# Patient Record
Sex: Female | Born: 1950 | Race: White | Hispanic: No | Marital: Married | Smoking: Never smoker
Health system: Southern US, Community
[De-identification: ages and names within clinical notes are randomized; demographics above are authoritative.]

## PROBLEM LIST (undated history)

## (undated) DIAGNOSIS — H9190 Unspecified hearing loss, unspecified ear: Secondary | ICD-10-CM

## (undated) DIAGNOSIS — E039 Hypothyroidism, unspecified: Secondary | ICD-10-CM

## (undated) DIAGNOSIS — C801 Malignant (primary) neoplasm, unspecified: Secondary | ICD-10-CM

## (undated) DIAGNOSIS — M1990 Unspecified osteoarthritis, unspecified site: Secondary | ICD-10-CM

## (undated) DIAGNOSIS — I10 Essential (primary) hypertension: Secondary | ICD-10-CM

## (undated) DIAGNOSIS — K219 Gastro-esophageal reflux disease without esophagitis: Secondary | ICD-10-CM

## (undated) DIAGNOSIS — E785 Hyperlipidemia, unspecified: Secondary | ICD-10-CM

## (undated) HISTORY — DX: Hyperlipidemia, unspecified: E78.5

## (undated) HISTORY — PX: TONSILLECTOMY: SUR1361

## (undated) HISTORY — DX: Gastro-esophageal reflux disease without esophagitis: K21.9

## (undated) HISTORY — DX: Unspecified osteoarthritis, unspecified site: M19.90

## (undated) HISTORY — DX: Malignant (primary) neoplasm, unspecified: C80.1

## (undated) HISTORY — DX: Unspecified hearing loss, unspecified ear: H91.90

---

## 1997-04-02 ENCOUNTER — Other Ambulatory Visit: Admission: RE | Admit: 1997-04-02 | Discharge: 1997-04-02 | Payer: Self-pay | Admitting: *Deleted

## 1997-04-09 ENCOUNTER — Other Ambulatory Visit: Admission: RE | Admit: 1997-04-09 | Discharge: 1997-04-09 | Payer: Self-pay | Admitting: *Deleted

## 1997-12-14 ENCOUNTER — Other Ambulatory Visit: Admission: RE | Admit: 1997-12-14 | Discharge: 1997-12-14 | Payer: Self-pay | Admitting: *Deleted

## 1998-12-16 ENCOUNTER — Other Ambulatory Visit: Admission: RE | Admit: 1998-12-16 | Discharge: 1998-12-16 | Payer: Self-pay | Admitting: *Deleted

## 1999-01-03 HISTORY — PX: ABDOMINAL HYSTERECTOMY: SHX81

## 1999-02-16 ENCOUNTER — Encounter (INDEPENDENT_AMBULATORY_CARE_PROVIDER_SITE_OTHER): Payer: Self-pay | Admitting: Specialist

## 1999-02-16 ENCOUNTER — Inpatient Hospital Stay (HOSPITAL_COMMUNITY): Admission: RE | Admit: 1999-02-16 | Discharge: 1999-02-18 | Payer: Self-pay | Admitting: *Deleted

## 1999-12-20 ENCOUNTER — Other Ambulatory Visit: Admission: RE | Admit: 1999-12-20 | Discharge: 1999-12-20 | Payer: Self-pay | Admitting: *Deleted

## 2000-10-22 ENCOUNTER — Encounter: Payer: Self-pay | Admitting: Internal Medicine

## 2000-10-22 ENCOUNTER — Ambulatory Visit (HOSPITAL_COMMUNITY): Admission: RE | Admit: 2000-10-22 | Discharge: 2000-10-22 | Payer: Self-pay | Admitting: Internal Medicine

## 2000-12-27 ENCOUNTER — Other Ambulatory Visit: Admission: RE | Admit: 2000-12-27 | Discharge: 2000-12-27 | Payer: Self-pay | Admitting: *Deleted

## 2001-12-30 ENCOUNTER — Other Ambulatory Visit: Admission: RE | Admit: 2001-12-30 | Discharge: 2001-12-30 | Payer: Self-pay | Admitting: *Deleted

## 2002-05-28 ENCOUNTER — Other Ambulatory Visit: Admission: RE | Admit: 2002-05-28 | Discharge: 2002-05-28 | Payer: Self-pay | Admitting: Dermatology

## 2002-12-22 ENCOUNTER — Other Ambulatory Visit: Admission: RE | Admit: 2002-12-22 | Discharge: 2002-12-22 | Payer: Self-pay | Admitting: *Deleted

## 2003-01-21 ENCOUNTER — Ambulatory Visit (HOSPITAL_COMMUNITY): Admission: RE | Admit: 2003-01-21 | Discharge: 2003-01-21 | Payer: Self-pay | Admitting: Internal Medicine

## 2003-01-21 HISTORY — PX: COLONOSCOPY: SHX174

## 2003-01-21 HISTORY — PX: ESOPHAGOGASTRODUODENOSCOPY: SHX1529

## 2003-01-23 ENCOUNTER — Ambulatory Visit (HOSPITAL_COMMUNITY): Admission: RE | Admit: 2003-01-23 | Discharge: 2003-01-23 | Payer: Self-pay | Admitting: Internal Medicine

## 2003-01-27 ENCOUNTER — Ambulatory Visit (HOSPITAL_COMMUNITY): Admission: RE | Admit: 2003-01-27 | Discharge: 2003-01-27 | Payer: Self-pay | Admitting: Internal Medicine

## 2003-12-23 ENCOUNTER — Other Ambulatory Visit: Admission: RE | Admit: 2003-12-23 | Discharge: 2003-12-23 | Payer: Self-pay | Admitting: *Deleted

## 2004-02-10 ENCOUNTER — Ambulatory Visit: Payer: Self-pay | Admitting: Internal Medicine

## 2004-03-29 ENCOUNTER — Ambulatory Visit (HOSPITAL_COMMUNITY): Admission: RE | Admit: 2004-03-29 | Discharge: 2004-03-29 | Payer: Self-pay | Admitting: Neurology

## 2004-06-02 ENCOUNTER — Ambulatory Visit: Payer: Self-pay | Admitting: Internal Medicine

## 2004-08-26 ENCOUNTER — Ambulatory Visit: Payer: Self-pay | Admitting: *Deleted

## 2004-09-09 ENCOUNTER — Ambulatory Visit: Payer: Self-pay | Admitting: *Deleted

## 2004-09-09 ENCOUNTER — Ambulatory Visit (HOSPITAL_COMMUNITY): Admission: RE | Admit: 2004-09-09 | Discharge: 2004-09-09 | Payer: Self-pay | Admitting: *Deleted

## 2004-09-13 ENCOUNTER — Ambulatory Visit: Payer: Self-pay | Admitting: *Deleted

## 2004-09-20 ENCOUNTER — Ambulatory Visit (HOSPITAL_COMMUNITY): Admission: RE | Admit: 2004-09-20 | Discharge: 2004-09-20 | Payer: Self-pay | Admitting: *Deleted

## 2004-09-20 ENCOUNTER — Ambulatory Visit: Payer: Self-pay | Admitting: *Deleted

## 2004-09-27 ENCOUNTER — Ambulatory Visit: Payer: Self-pay | Admitting: *Deleted

## 2005-01-16 ENCOUNTER — Ambulatory Visit: Payer: Self-pay | Admitting: Internal Medicine

## 2005-01-26 ENCOUNTER — Other Ambulatory Visit: Admission: RE | Admit: 2005-01-26 | Discharge: 2005-01-26 | Payer: Self-pay | Admitting: *Deleted

## 2006-01-09 ENCOUNTER — Ambulatory Visit: Payer: Self-pay | Admitting: Internal Medicine

## 2006-03-26 ENCOUNTER — Other Ambulatory Visit: Admission: RE | Admit: 2006-03-26 | Discharge: 2006-03-26 | Payer: Self-pay | Admitting: *Deleted

## 2006-07-08 ENCOUNTER — Emergency Department (HOSPITAL_COMMUNITY): Admission: EM | Admit: 2006-07-08 | Discharge: 2006-07-08 | Payer: Self-pay | Admitting: Emergency Medicine

## 2006-09-07 ENCOUNTER — Emergency Department (HOSPITAL_COMMUNITY): Admission: EM | Admit: 2006-09-07 | Discharge: 2006-09-07 | Payer: Self-pay | Admitting: Emergency Medicine

## 2006-11-01 ENCOUNTER — Ambulatory Visit: Payer: Self-pay | Admitting: Internal Medicine

## 2007-03-27 ENCOUNTER — Other Ambulatory Visit: Admission: RE | Admit: 2007-03-27 | Discharge: 2007-03-27 | Payer: Self-pay | Admitting: *Deleted

## 2007-10-02 ENCOUNTER — Ambulatory Visit: Payer: Self-pay | Admitting: Internal Medicine

## 2008-01-20 ENCOUNTER — Ambulatory Visit (HOSPITAL_COMMUNITY): Admission: RE | Admit: 2008-01-20 | Discharge: 2008-01-20 | Payer: Self-pay | Admitting: Internal Medicine

## 2008-04-21 ENCOUNTER — Encounter: Payer: Self-pay | Admitting: Internal Medicine

## 2008-04-30 ENCOUNTER — Encounter: Payer: Self-pay | Admitting: Gastroenterology

## 2008-06-04 ENCOUNTER — Telehealth: Payer: Self-pay | Admitting: Gastroenterology

## 2008-06-09 ENCOUNTER — Telehealth (INDEPENDENT_AMBULATORY_CARE_PROVIDER_SITE_OTHER): Payer: Self-pay

## 2008-06-18 DIAGNOSIS — K219 Gastro-esophageal reflux disease without esophagitis: Secondary | ICD-10-CM | POA: Insufficient documentation

## 2008-06-18 DIAGNOSIS — R079 Chest pain, unspecified: Secondary | ICD-10-CM | POA: Insufficient documentation

## 2008-06-19 ENCOUNTER — Ambulatory Visit: Payer: Self-pay | Admitting: Cardiology

## 2008-06-23 ENCOUNTER — Telehealth (INDEPENDENT_AMBULATORY_CARE_PROVIDER_SITE_OTHER): Payer: Self-pay

## 2008-07-07 ENCOUNTER — Telehealth (INDEPENDENT_AMBULATORY_CARE_PROVIDER_SITE_OTHER): Payer: Self-pay

## 2008-08-19 ENCOUNTER — Encounter: Payer: Self-pay | Admitting: Gastroenterology

## 2008-11-04 DIAGNOSIS — K5900 Constipation, unspecified: Secondary | ICD-10-CM | POA: Insufficient documentation

## 2008-11-05 ENCOUNTER — Ambulatory Visit: Payer: Self-pay | Admitting: Internal Medicine

## 2008-11-05 DIAGNOSIS — E039 Hypothyroidism, unspecified: Secondary | ICD-10-CM | POA: Insufficient documentation

## 2008-11-09 ENCOUNTER — Ambulatory Visit (HOSPITAL_COMMUNITY): Admission: RE | Admit: 2008-11-09 | Discharge: 2008-11-09 | Payer: Self-pay | Admitting: Surgery

## 2008-12-21 ENCOUNTER — Telehealth (INDEPENDENT_AMBULATORY_CARE_PROVIDER_SITE_OTHER): Payer: Self-pay

## 2009-04-12 ENCOUNTER — Telehealth (INDEPENDENT_AMBULATORY_CARE_PROVIDER_SITE_OTHER): Payer: Self-pay

## 2009-04-15 ENCOUNTER — Encounter: Payer: Self-pay | Admitting: Internal Medicine

## 2009-05-18 ENCOUNTER — Ambulatory Visit (HOSPITAL_COMMUNITY): Admission: RE | Admit: 2009-05-18 | Discharge: 2009-05-18 | Payer: Self-pay | Admitting: Internal Medicine

## 2009-05-18 ENCOUNTER — Ambulatory Visit: Payer: Self-pay | Admitting: Internal Medicine

## 2009-05-18 HISTORY — PX: COLONOSCOPY: SHX174

## 2009-06-03 ENCOUNTER — Encounter: Payer: Self-pay | Admitting: Gastroenterology

## 2009-08-13 ENCOUNTER — Ambulatory Visit (HOSPITAL_COMMUNITY): Admission: RE | Admit: 2009-08-13 | Discharge: 2009-08-13 | Payer: Self-pay | Admitting: Surgery

## 2009-09-21 ENCOUNTER — Telehealth (INDEPENDENT_AMBULATORY_CARE_PROVIDER_SITE_OTHER): Payer: Self-pay

## 2009-10-14 ENCOUNTER — Telehealth (INDEPENDENT_AMBULATORY_CARE_PROVIDER_SITE_OTHER): Payer: Self-pay

## 2010-01-21 ENCOUNTER — Emergency Department (HOSPITAL_COMMUNITY)
Admission: EM | Admit: 2010-01-21 | Discharge: 2010-01-21 | Payer: Self-pay | Source: Home / Self Care | Admitting: Emergency Medicine

## 2010-02-01 NOTE — Progress Notes (Signed)
Summary: PA for lansoprazole  Phone Note Call from Patient Call back at Work Phone [phone]   Caller: Patient Summary of Call: pt called- needs PA for lansoprazole. Insurance phone number is [phone]. Called company- got approval for 1 year starting today. called pt- LMOM with this information.  Initial call taken by: Hendricks Limes LPN,  October 14, 2009 9:29 AM

## 2010-02-01 NOTE — Progress Notes (Signed)
Summary: refills  Phone Note Call from Patient Call back at Work Phone [phone]   Caller: Patient Summary of Call: pt called- she is almost out of lansoprazole. pt wants to know if she needs an ov or if we can send refill to [facility] for mail order. please advise Initial call taken by: Hendricks Limes LPN,  September 21, 2009 12:12 PM     Appended Document: refills    Prescriptions: LANSOPRAZOLE 30 MG CPDR (LANSOPRAZOLE) one by mouth 30 mins before breakfast daily  #90 x 3   Entered and Authorized by:   Gerrit Halls NP   Signed by:   Gerrit Halls NP on 09/22/2009   Method used:   Faxed to ...       [facility] (mail-order)             , [place]         Ph: [phone]       Fax: [phone]   RxID:   [id number]

## 2010-02-01 NOTE — Progress Notes (Signed)
  Phone Note Call from Patient Call back at Home Phone [phone]   Caller: Patient Summary of Call: pt called- she went to pcp and had complete physical done. she stated they checked her stool for blood and it came back faintly positive. she also stated her abd was mildly swollen and painful in the am. wants to know what she needs to do. Initial call taken by: Hendricks Limes LPN,  April 12, 2009 12:06 PM     Appended Document:  if last tcs was 05 and we have documentation of hemocult positive status - she will need a tcs  Appended Document:  pt aware, Doris to get pt scheduled

## 2010-02-01 NOTE — Medication Information (Signed)
Summary: rx refill fax form  rx refill fax form   Imported By: Hendricks Limes LPN 54/09/8117 14:78:29  _____________________________________________________________________  External Attachment:    Type:   Image     Comment:   External Document

## 2010-02-01 NOTE — Letter (Signed)
Summary: Internal Other Domingo Dimes  Internal Other Domingo Dimes   Imported By: Cloria Spring LPN 93/23/5573 22:02:54  _____________________________________________________________________  External Attachment:    Type:   Image     Comment:   External Document

## 2010-02-01 NOTE — Progress Notes (Signed)
Summary: lansoprazole refill  Phone Note Call from Patient Call back at Home Phone [phone] Call back at Work Phone [phone] Call back at [phone]   Caller: Patient Summary of Call: pt called- needs refill on lansoprazole written and faxed to [facility]. Initial call taken by: Hendricks Limes LPN,  April 12, 2009 10:52 AM     Appended Document: lansoprazole refill LSL gave 3 RFs in 12/2008 w/ #90 day supply.  Please let pt know.  Appended Document: lansoprazole refill spoke with Durwin Nora at [facility]. he stated pt did have refills left. Called pt- LMOM with that information.

## 2010-05-17 NOTE — Assessment & Plan Note (Signed)
[facility]                       [place] CARDIOLOGY OFFICE NOTE   NAME:BUTLERSuzetta, Timko                        MRN:          [id number]  DATE:06/19/2008                            DOB:          [birth year]    Eileen Green is a Cardiology consultation.   I was asked by Dr. Carylon Perches to reevaluate Eileen Green with chest pain.   The Sunday before Memorial Day, she had a severe episode of chest pain  in the middle of the night.  It was described as a burning and a  pressure in the center of her chest.  It did not radiate.  She has some  nausea with it.  She is finally able to burp enough to relieve it.   She has a history of acid reflux and has been on chronic Aciphex.  She  called Dr. Jena Gauss, who placed her on Dexilant 60 mg a day.  She started  this about 4 or 5 days ago.   Remarkably, the day before the night of the severe episode of  discomfort, she had eaten out all day including [employer] and also  [employer].  She had ribs for dinner that night.   She denies any hematemesis or hemoptysis or melena.  She has had no  change in her bowel habits.   She has been seen in our office twice, once in 2001 and once in 2006 for  chest discomfort.  She had a negative stress test when she was last here  specifically a negative stress echo with an ejection fraction of 60% by  Dr. Dionicio Stall.   She is followed by Dr. Ouida Sills, who placed her on simvastatin about a year  ago.  She does not take aspirin.   Her past medical history, she does not smoke.  She eats poorly and  admits to it.  She has fought her weight for sometime.  She does not  exercise.   Her current meds are:  1. Centrum once a day.  2. Fibertab 2 a day.  3. Dexilant 60 mg a day.  4. Synthroid 88 mcg a day.  5. Glucosamine 150 mg a day.  6. Vitamin D.  7. Calcium 1 a day.  8. Simvastatin 20 mg nightly.   She had a hysterectomy in 2001.  She has also had a oophorectomy in the  past.   She has no known drug allergies.   SOCIAL HISTORY:  She does not smoke or drink.  She is married.  She  lives in [place].  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   Her family history is remarkable for mother having coronary bypass  grafting.  She died at late age 24 after she stopped taking all of her  meds.  Her name is Eileen Green.   Her review of systems, she wears contacts.  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  Her teeth are in good shape.  She has had some lower extremity edema  particularly after being at [facility] for a week.   Rest of review of systems are  negative.   PHYSICAL EXAMINATION:  GENERAL:  Very pleasant lady in no acute  distress.  VITAL SIGNS:  Her blood pressure is 138/90, pulse is 95 and regular.  I  rechecked it after she had sat for a while and it was 65 and regular.  Her weight is 187.  HEENT:  Normal.  NECK:  Supple.  Carotid upstrokes were equal bilaterally without bruits.  There is no JVD.  Thyroid is not enlarged.  Trachea is midline.  LUNGS:  Clear to auscultation and percussion.  HEART:  Regular rate and rhythm.  PMI is nondisplaced.  There is no  murmur, rub, or gallop.  ABDOMEN:  Soft, good bowel sounds.  No midline bruit.  No hepatomegaly.  There is really no epigastric tenderness.  EXTREMITIES:  No cyanosis,  clubbing, but she does have trace edema.  Pulses are intact.  No obvious  varicosities.  No sign of DVT.  NEURO:  Intact.  SKIN:  Unremarkable.   Her electrocardiogram is completely normal.   ASSESSMENT AND PLAN:  1. Gastroesophageal reflux.  2. Noncardiac chest pain.  3. Hyperlipidemia on simvastatin.  4. Obesity.  5. Sedentary lifestyle.   RECOMMENDATIONS:  1. Continue with antireflux precautions and the new medication.  2. If she has any symptoms consistent with angina which I described      her at length today, she will let us know.  3. Once she is over her esophagitis and gastroesophageal reflux, she      wonders  she should take an aspirin each day.  I told her 33 of a      coated aspirin was reasonable.  4. Walk 3 hours a week.  5. Weight reduction.  6. Followup with Dr. Ouida Sills.     Thomas C. Daleen Squibb, MD, [facility]  Electronically Signed    TCW/MedQ  DD: 06/19/2008  DT: 06/20/2008  Job #: [id number]   cc:   Kingsley Callander. Ouida Sills, MD

## 2010-05-17 NOTE — Assessment & Plan Note (Signed)
NAMECHERYLENE, Green                  CHART#:  [id number]   DATE:  11/01/2006                       DOB:  March 24, 1950   Eileen Green is doing well from a GI standpoint.  Aciphex 20 mg daily is  squelching her reflux symptoms.  If she misses a day or two, she has  recurrent symptoms.  No odynophagia.  No dysphagia.  EGD and colonoscopy  back in 2005 were pretty much negative.  She had a long tortuous colon.  She has been taking Citrucel once to twice daily, and MiraLax at bedtime  to combat constipation.  The big challenge is her memory to take the  MiraLax in the evening.  She may get it 3 to 4 days, and she realizes  she has not had a bowel movement, and has not taken any MiraLax.  She  gets back on MiraLax, and then eventually over the next couple of days  has a bowel movement.  Has not had any rectal bleeding or other  significant illnesses.  She did have some gastroenteritis symptoms for  which she saw Dr. Ouida Sills back on July 3rd.  Those were self-limiting, and  no problems since that time.   CURRENT MEDICATIONS:  See updated list.   ALLERGIES:  NO KNOWN DRUG ALLERGIES.   EXAMINATION:  She looks well.  Weight 175, which is down 9 pounds.  Height is 5 feet 6 inches.  Temperature 98.  BP 122/86.  Pulse 69.  SKIN:  Warm and dry.  CHEST:  Lungs clear to auscultation.  HEART:  Regular rate and rhythm without murmur, gallop, or rub.  ABDOMEN:  Flat.  Positive bowel sounds.  Soft and nontender.  No  appreciable mass or organomegaly.   ASSESSMENT:  Gastroesophageal reflux disease symptoms are well  controlled on Aciphex.  She is to continue that regimen and watch her  diet.  One year prescription refill given.  Constipation predominant  irritable bowel syndrome.  I have asked her to continue taking her  Citrucel once daily, and she might actually try taking her MiraLax in  the morning rather than at bedtime to facilitate compliance.  She might  ought to plan to take MiraLax as a scheduled  agent every other day.  I  would like her to go no more than 3 days without a bowel movement  (ideally  2).  She is to call if she has any persisting problems.  Otherwise, I  plan to see this nice lady back at 1 year.  She will not be due another  screening colonoscopy until 2015.       Jonathon Bellows, M.D.  Electronically Signed     RMR/MEDQ  D:  11/01/2006  T:  11/01/2006  Job:  [id number]   cc:   Kingsley Callander. Ouida Sills, MD

## 2010-05-17 NOTE — Assessment & Plan Note (Signed)
NAMEKYMORAH, KORF                  CHART#:  [id number]   DATE:  10/02/2007                       DOB:  05/25/1950   Follow up gastroesophageal reflux disease and constipation.   The patient takes Aciphex 20 mg orally daily with excellent control of  her reflux symptoms.  She does normally takes MiraLax on a regular basis  and may go 3-4 days without taking it and then has difficulties with  constipation.  This has been recurrent thing.  She has not had any  rectal bleeding.  She had essentially negative colonoscopy in 2005.  No  intercurrent illnesses since last seen here on November 01, 2006.   CURRENT MEDICATIONS:  See updated list.   ALLERGIES:  No known drug allergies.   PHYSICAL EXAMINATION:  VITAL SIGNS:  Weight 187, up 12 pounds; height 5  feet and 6 inches; temperature 98, BP 120/80, and pulse 84.  SKIN:  Warm and dry.  CHEST:  Lungs are clear to auscultation.  CARDIAC:  Regular rate and rhythm without murmur, gallop, or rub.  ABDOMEN:  Nondistended, positive bowel sounds, soft, and nontender  without appreciable mass or organomegaly.   ASSESSMENT:  1. Chronic constipation/obstipation responsive to MiraLax, which she      takes only sporadically.  2. Gastroesophageal reflux disease, symptoms well controlled on      Aciphex 20 mg orally daily.   RECOMMENDATIONS:  1. Continue Aciphex 20 mg orally daily.  2. We will get her on more regular schedule with MiraLax, use 17 g      orally at bedtime, to be taking nightly and see how she does with      back off only if she has difficulties with diarrhea.  I believe      that she is going to need MiraLax daily to keep her colon from      backing up unless she has any problem with this regimen.  I will      plan to see her back in 1 year.      Jonathon Bellows, M.D.  Electronically Signed    RMR/MEDQ  D:  10/02/2007  T:  10/03/2007  Job:  [id number]

## 2010-05-20 NOTE — Op Note (Signed)
NAME:  Eileen Green, Eileen Green                           ACCOUNT NO.:  [id number]   MEDICAL RECORD NO.:  [id number]                   PATIENT TYPE:  AMB   LOCATION:  DAY                                  FACILITY:  [facility]   PHYSICIAN:  R. Roetta Sessions, M.D.              DATE OF BIRTH:  22-Jan-1950   DATE OF PROCEDURE:  01/21/2003  DATE OF DISCHARGE:                                 OPERATIVE REPORT   PROCEDURE:  Diagnostic esophagogastroduodenoscopy followed by screening  colonoscopy.   ENDOSCOPIST:  Gerrit Friends. Rourk, M.D.   INDICATIONS FOR PROCEDURE:  The patient is a 60 year old lady who is  describing break through reflux symptoms, taking Aciphex daily.  I saw her  in the office several days ago and asked her to start taking Aciphex b.i.d..  She has not done so. She has a component of postprandial retroxiphoid pain  as well, sometimes lasting days. She has chronic constipation. She has never  had her colon imaged.  EGD and colonoscopy are now being done. This approach  has been discussed with the patient at length. The potential risks,  benefits, and alternatives have been reviewed; questions answered.  She is  agreeable.  Please see my dictated H&P from January 13, 2003.   PROCEDURE NOTE:  O2 saturation, blood pressure, pulse and respirations were  monitored throughout the entirety of the procedures.  Conscious sedation:  Versed 4 mg IV, Demerol 100 mg IV in divided doses for both procedures.   INSTRUMENT:  Olympus video chip adult gastroscope and colonoscope.   EGD FINDINGS:  Examination of the tubular esophagus revealed no mucosal  abnormalities.  The EG junction was easily traversed.   STOMACH:  The gastric cavity was empty.  It insufflated well with air.  A  thorough examination of the gastric mucosa including a retroflex view of the  proximal stomach and esophagogastric junction demonstrated no abnormalities.  The pylorus was patent and easily traversed.   DUODENUM:  The bulb and  the second portion appeared normal.   THERAPEUTIC/DIAGNOSTIC MANEUVERS:  None.   The patient tolerated the procedure well and was prepared for colonoscopy.  A digital rectal exam revealed no abnormalities.   ENDOSCOPIC FINDINGS:  The prep was unfortunately marginal particularly in  the left colon (the patient said that she had solid food yesterday including  a [place] sandwich against our recommendations).   RECTUM:  Examination of the rectal mucosa including the retroflex view of  the anal verge revealed no abnormalities.   COLON:  The colonic mucosa was surveyed from the rectosigmoid junction  through the left transverse and right colon to the area of the appendiceal  orifice, ileocecal valve, and cecum. These structures were well seen and  photographed for the record.   From this level the scope was slowly and cautiously withdrawn.  All  previously mentioned mucosal surfaces were again seen.  The patient had a  long tortuous colon.  The colonic mucosa otherwise appeared normal.  There  was formed stool elements in a segment of the sigmoid and descending colon  which made the exam more difficulty, but with washing and changing the  patient's position, I feel that the mucosa was fairly well seen.  The  patient tolerated both procedures well and was reacted in endoscopy.   EGD IMPRESSION:  Normal esophagus, stomach, D1 and D2.   COLONOSCOPY FINDINGS:  1. Normal rectum.  2. Long capacious colon, moderate prep made the exam more challenging.   RECOMMENDATIONS:  1. Constipation literature given to [name].  2. Daily fiber supplementation recommended.  3. MiraLax 17 grams orally daily at bedtime p.r.n. constipation.  4. Continue Aciphex 20 mg orally daily.  5. This lady needs to have further evaluation of her postprandial     retroxiphoid pain.  To this end, we will proceed with a gallbladder     ultrasound.  6. Followup appointment with me in 3 weeks.       ___________________________________________                                            Jonathon Bellows, M.D.   RMR/MEDQ  D:  01/21/2003  T:  01/21/2003  Job:  [id number]   cc:   Kingsley Callander. Ouida Sills, M.D.  [address]  [place]  [place] [address]  Fax: [phone]   R. Roetta Sessions, M.D.  [address]  [place]  [place] [address]  Fax: [phone]

## 2010-05-20 NOTE — Procedures (Signed)
NAMEERZA, MOTHERSHEAD NO.:  [id number]   MEDICAL RECORD NO.:  [id number]          PATIENT TYPE:  OUT   LOCATION:  RAD                           FACILITY:  [facility]   PHYSICIAN:  Vida Roller, M.D.   DATE OF BIRTH:  [birth year]   DATE OF PROCEDURE:  09/20/2004  DATE OF DISCHARGE:                                  ECHOCARDIOGRAM   HISTORY OF PRESENT ILLNESS:  Eileen Green is a 60 year old woman with a  history of atypical chest discomfort.  She has no known coronary artery  disease.  She had an exercise profusion study done in our nuclear lab which  unfortunately was nondiagnostic and this an evaluation for ischemia.   DESCRIPTION OF PROCEDURE:  The patient was brought to the echocardiographic  lab where she was placed in the left lateral decubitus position.  Echocardiographic images were obtained in the apical IV, apical II,  parasternal lung, parasternal short axis view.  These views were kept on  tape as well as Cine-loop format.  The patient then exercised under Bruce  protocol and maintained at least 85% of her maximum predicted heart rate for  her age.  She was moved immediately back to the echocardiographic table  where echocardiographic images were obtained in the same four views.  These  were used in comparison with rest images to assess for ischemia.  She was  then recovered appropriate for Bruce protocol.   REST IMAGES:  There is normal LV systolic function with no significant wall  motion abnormalities.  Estimated ejection fraction 60%.   POST STRESS IMAGES:  LV systolic function improves with exercise.  There is  no significant stress related wall motion abnormalities.  The overall cavity  size gets significantly smaller.   ASSESSMENT:  No evidence of coronary ischemia, normal left ventricular  systolic function.   STRESS TEST:  The patient exercised 8 minutes and 59 seconds of her Bruce  protocol obtaining 10 METS of exercise.  Her heart rate increased  from 97  beats per minute to 164 beats per minute which is 98% of her maximum  predicted heart rate for her age.  Her blood pressure from 132/88 to 198/78.  During that time, she had no ischemic ST and T wave changes and there was no  significant arrhythmia.  Her baseline electrocardiogram is normal.   INTERPRETATION:  This is a low-risk scan in a patient with no known coronary  artery disease.  Clinical correlation is advised.      Vida Roller, M.D.  Electronically Signed     JH/MEDQ  D:  09/20/2004  T:  09/21/2004  Job:  [id number]   cc:   Kingsley Callander. Ouida Sills, MD  [address]  [place]  [place] [address]  Fax: [phone]

## 2010-05-20 NOTE — Procedures (Signed)
NAMEKENNIYAH, Eileen Green NO.:  [id number]   MEDICAL RECORD NO.:  [id number]          PATIENT TYPE:  REC   LOCATION:                                FACILITY:  [facility]   PHYSICIAN:  Vida Roller, M.D.   DATE OF BIRTH:  1950/05/12   DATE OF PROCEDURE:  DATE OF DISCHARGE:                                    STRESS TEST   HISTORY:  Ms. Yearsley is a 60 year old female with no known coronary disease  with atypical chest discomfort. Cardiac risk factors include family history.   BASELINE DATA:  Electrocardiogram reveals sinus rhythm at 65 beats per  minute, blood pressure is 148/90.   The patient exercised for a total of 7 minutes 22 seconds, Bruce protocol  stage III, and 10.1 minutes were achieved. Maximal heart rate was 160 beats  per minute which is 95% of predicted maximum. Maximum blood pressure was  182/80 and resolved down to 132/ 78 in recovery.   Electrocardiogram revealed no ischemic changes. She had few PVCs during  exercise. The patient reported some mild shortness of breath at the end of  exercise. No chest discomfort was noted. Exercise was stopped secondary to  fatigue.   Final images and results are pending M.D. review.      Jae Dire, P.A. [facility]      Vida Roller, M.D.  Electronically Signed    AB/MEDQ  D:  09/09/2004  T:  09/09/2004  Job:  [id number]

## 2010-05-20 NOTE — Consult Note (Signed)
NAMEANASTASIJA, Eileen Green                             ACCOUNT NO.:  [id number]   MEDICAL RECORD NO.:  [id number]                  PATIENT TYPE:   LOCATION:                                       FACILITY:   PHYSICIAN:  R. Roetta Sessions, M.D.              DATE OF BIRTH:  April 22, 1950   DATE OF CONSULTATION:  01/13/2003  DATE OF DISCHARGE:                                   CONSULTATION   REASON FOR CONSULTATION:  Gastroesophageal reflux disease.   REFERRING PHYSICIAN:  Dr.  Carylon Perches.   HISTORY OF PRESENT ILLNESS:  Mrs. Trisa Cranor is a pleasant 60 year old lady  sent over at the courtesy of Dr. Carylon Perches to further evaluate a year or so  history of worsening reflux symptoms.  She has symptoms she describes as  heartburn on a regular basis.  She has been on Aciphex more or less since  May 2004.  She has tried to go off and go to OTC Zantac.  However, this has  been associated with recurrent symptoms.  She has not had any odynophagia,  dysphagia, nausea, vomiting, melena, or rectal bleeding.  She has gained  about 20 pounds in the past 18 months.  She tells me that over the past  several weeks her symptoms have notably worsened and she is having an  element of what she describes as retroxyphoid abdominal pain, sometimes  starting within a half an hour after she eats, may last a half an hour or  so, then it subsides.  Her gallbladder is in situ.  She has never had any  gallbladder studies per her report.  She also complains of constipation,  having very hard, large bowel movements, has to take Surfak laxative twice  daily.  There is family history of colorectal problems and she has never has  her lower GI tract imaged.  She does not smoke or use alcohol.   PAST MEDICAL HISTORY:  Significant for no chronic medical illnesses.  No  cardiac disease, diabetes, hypertension.   PAST SURGERY:  Partial hysterectomy, ovarian cyst (laparoscopy x3).   CURRENT MEDICATIONS:  1. Aciphex 20 mg orally  daily.  2. Surfak b.i.d.  3. Vitamin E and B supplements.  4. Magnesium supplement.  5. Multivitamin.  6. Flax seed oil.  7. Aspirin 81 mg daily.   ALLERGIES:  No known drug allergies.   FAMILY HISTORY:  Father's health is fair, is on dialysis.  Mother is in  fairly good health.  No history of chronic GI or liver illness.   SOCIAL HISTORY:  The patient has been married for 25-and-a-half years.  She  has two children in good health.  She is employed at [employer].  No  tobacco, no alcohol.   REVIEW OF SYSTEMS:  As above.  No chest pain, no dyspnea, no fever or  chills.   PHYSICAL EXAMINATION:  GENERAL:  Reveals a pleasant  60 year old lady resting  comfortably.  VITAL SIGNS:  Weight 181.5 pounds, height 5 feet 6 inches.  Temperature  97.6, blood pressure 110/73, pulse 76.  SKIN:  Warm and dry.  There is no jaundice, no cutaneous stigmata of chronic  liver disease.  HEENT:  No scleral icterus, conjunctivae are pink.  JVD is not prominent.  CHEST:  Lungs are clear to auscultation.  CARDIAC:  Regular rate and rhythm without murmur, gallop, rub.  BREAST:  Deferred.  ABDOMEN:  Somewhat obese, positive bowel sounds, soft, nontender, without  appreciable mass or organomegaly.  EXTREMITIES:  No edema.  RECTAL:  Deferred until time of colonoscopy.   IMPRESSION:  Mrs. Posie Lillibridge is a pleasant 59 year old lady with symptoms  consistent with gastroesophageal reflux disease that have worsened over the  patient 9 months in spite of taking acid suppression therapy in the way of  Aciphex on a fairly regular basis.  She also has some retroxyphoid  postprandial abdominal pain which is suspicious for biliary etiology.  Given  the breakthrough nature of her reflux symptoms on acid suppression she  really needs to have a look at her upper GI tract.  She also is chronically  constipated and is at the threshold age for colorectal cancer screening.  To  this end I have offered Mrs. Bianchini  both an EGD and colonoscopy in the near  future.  I discussed this approach with Mrs. Roseboom at length.  Potential  risks, benefits, alternatives have been reviewed, questions answered.  She  is agreeable to proceed in the near future.  In the interim I have asked  Mrs. Zerkle to increase her Aciphex to 20 mg orally before breakfast and  before supper to see how more aggressive acid suppression regimen affects  her symptoms.   I would like to thank Dr. Carylon Perches for allowing me to see this nice lady  today.  Further recommendations to follow.      ___________________________________________                                            Jonathon Bellows, M.D.   RMR/MEDQ  D:  01/13/2003  T:  01/13/2003  Job:  [id number]   cc:   Kingsley Callander. Ouida Sills, M.D.  [address]  [place]  [place] [address]  Fax: [phone]

## 2010-05-20 NOTE — Procedures (Signed)
NAMEBENTLEIGH, WAREN NO.:  [id number]   MEDICAL RECORD NO.:  [id number]          PATIENT TYPE:  OUT   LOCATION:  RAD                           FACILITY:  [facility]   PHYSICIAN:  Vida Roller, M.D.   DATE OF BIRTH:  19-Dec-1950   DATE OF PROCEDURE:  09/20/2004  DATE OF DISCHARGE:                                    STRESS TEST   HISTORY OF PRESENT ILLNESS:  The patient is a 60 year old female with no  previous history of known CAD, but presented with atypical chest pain.  Previous exercise Myoview was done which showed no diagnostic value.   TEST PERFORMED:  Stress echocardiogram by April Humphrey, N.P.   INDICATIONS FOR TEST:  Chest pain.   SYMPTOMS DURING PROCEDURE:  The patient experienced some shortness of breath  with exertional towards the end, but no chest pain.  No arrhythmias noted.  The reason for stopping the test was patient fatigue.  Resting heart rate  was 97, resting blood pressure 132/88 with target heart rate 143.  Maximum  predicted heart rate was 168.  Maximum heart rate achieved was 164.  Maximum  blood pressure was 198/78.  Total exercise time was 8 minutes 57 seconds.  METS achieved was 2.1.  Worse case ST slope was 14.  Worse case ST level was  -2.2.  The patient complained of fatigued as stress echocardiogram was  completed.  Final imaging pending cardiology review.     ______________________________  April Humphrey, NP      Vida Roller, M.D.  Electronically Signed    AH/MEDQ  D:  09/20/2004  T:  09/20/2004  Job:  [id number]

## 2010-05-23 ENCOUNTER — Encounter (INDEPENDENT_AMBULATORY_CARE_PROVIDER_SITE_OTHER): Payer: Self-pay | Admitting: Surgery

## 2010-08-29 ENCOUNTER — Encounter (INDEPENDENT_AMBULATORY_CARE_PROVIDER_SITE_OTHER): Payer: Self-pay | Admitting: Surgery

## 2010-08-31 ENCOUNTER — Other Ambulatory Visit (INDEPENDENT_AMBULATORY_CARE_PROVIDER_SITE_OTHER): Payer: Self-pay

## 2010-08-31 ENCOUNTER — Other Ambulatory Visit (INDEPENDENT_AMBULATORY_CARE_PROVIDER_SITE_OTHER): Payer: Self-pay | Admitting: Surgery

## 2010-08-31 DIAGNOSIS — E041 Nontoxic single thyroid nodule: Secondary | ICD-10-CM

## 2010-08-31 MED ORDER — SYNTHROID 50 MCG PO TABS: 50.0000 ug | ORAL_TABLET | Freq: Every day | ORAL | Status: DC

## 2010-08-31 NOTE — Progress Notes (Signed)
Addended by: Charise Carwin on: 08/31/2010 04:18 PM   Modules accepted: Orders

## 2010-09-27 ENCOUNTER — Ambulatory Visit (INDEPENDENT_AMBULATORY_CARE_PROVIDER_SITE_OTHER): Payer: Self-pay | Admitting: Surgery

## 2010-09-27 ENCOUNTER — Other Ambulatory Visit (INDEPENDENT_AMBULATORY_CARE_PROVIDER_SITE_OTHER): Payer: Self-pay | Admitting: Surgery

## 2010-09-27 DIAGNOSIS — E042 Nontoxic multinodular goiter: Secondary | ICD-10-CM

## 2010-09-28 ENCOUNTER — Other Ambulatory Visit (INDEPENDENT_AMBULATORY_CARE_PROVIDER_SITE_OTHER): Payer: Self-pay | Admitting: Surgery

## 2010-09-28 DIAGNOSIS — E041 Nontoxic single thyroid nodule: Secondary | ICD-10-CM

## 2010-09-29 ENCOUNTER — Ambulatory Visit (HOSPITAL_COMMUNITY)
Admission: RE | Admit: 2010-09-29 | Discharge: 2010-09-29 | Disposition: A | Discharge status: Home / Self Care | Payer: Federal, State, Local not specified - PPO | Source: Ambulatory Visit | Attending: Surgery | Admitting: Surgery

## 2010-09-29 ENCOUNTER — Other Ambulatory Visit: Payer: Self-pay

## 2010-09-29 DIAGNOSIS — E049 Nontoxic goiter, unspecified: Secondary | ICD-10-CM | POA: Insufficient documentation

## 2010-09-29 DIAGNOSIS — E041 Nontoxic single thyroid nodule: Secondary | ICD-10-CM

## 2010-10-05 ENCOUNTER — Encounter (INDEPENDENT_AMBULATORY_CARE_PROVIDER_SITE_OTHER): Payer: Self-pay | Admitting: Surgery

## 2010-10-06 ENCOUNTER — Encounter (INDEPENDENT_AMBULATORY_CARE_PROVIDER_SITE_OTHER): Payer: Self-pay | Admitting: Surgery

## 2010-10-06 ENCOUNTER — Ambulatory Visit (INDEPENDENT_AMBULATORY_CARE_PROVIDER_SITE_OTHER): Payer: Federal, State, Local not specified - PPO | Admitting: Surgery

## 2010-10-06 DIAGNOSIS — E042 Nontoxic multinodular goiter: Secondary | ICD-10-CM

## 2010-10-06 DIAGNOSIS — E039 Hypothyroidism, unspecified: Secondary | ICD-10-CM

## 2010-10-06 NOTE — Progress Notes (Signed)
Visit Diagnoses: 1. HYPOTHYROIDISM   2. Multinodular goiter (nontoxic)     HISTORY: Patient is a 60 year old female followed for bilateral thyroid nodules since 2010. Patient has had a previous fine needle aspiration biopsy which was benign. Patient is currently taking Synthroid 50 mcg daily as a suppressive dose. Her TSH level in July 2012 was normal at 1.34. Patient has experienced a decrease in size of her thyroid nodules well taking her suppressive therapy.  Followup thyroid ultrasound was obtained on September 29, 2010.  This study shows stable bilateral cystic and solid nodules. The largest cyst on the right measures 1.1 cm. The largest nodule on the left measures 1.0 cm. There is no significant change. There are no worrisome findings.   PERTINENT REVIEW OF SYSTEMS: Patient denies new nodules. She denies dysphagia. She denies pain. She denies tremor. She denies palpitations.   EXAM: HEENT: normocephalic; pupils equal and reactive; sclerae clear; dentition good; mucous membranes moist NECK:  Slight nodularity bilaterally without discrete nodule; symmetric on extension; no palpable anterior or posterior cervical lymphadenopathy; no supraclavicular masses; no tenderness CHEST: clear to auscultation bilaterally without rales, rhonchi, or wheezes CARDIAC: regular rate and rhythm without significant murmur; peripheral pulses are full EXT:  non-tender without edema; no deformity NEURO: no gross focal deficits; no sign of tremor   IMPRESSION: Bilateral thyroid nodules and cysts, clinically stable on thyroid hormone suppression   PLAN: The patient and I reviewed the above information. There is no evidence of malignancy. There is no indication for surgical intervention at this point in time.  Patient should continue on Synthroid 50 mcg daily. I have renewed her prescription through August of 2013 and  Patient should have a followup thyroid ultrasound in 2 years. I will see her back for  her physical examination at that time.   Velora Heckler, MD, FACS General & Endocrine Surgery [facility]

## 2011-09-01 ENCOUNTER — Telehealth (INDEPENDENT_AMBULATORY_CARE_PROVIDER_SITE_OTHER): Payer: Self-pay

## 2011-09-01 NOTE — Telephone Encounter (Signed)
Pt advised will have Dr Gerrit Friends review rx request for synthroid when he is in office 9-3. Pt states she has enough med thru next week.

## 2011-09-06 ENCOUNTER — Telehealth (INDEPENDENT_AMBULATORY_CARE_PROVIDER_SITE_OTHER): Payer: Self-pay

## 2011-09-06 NOTE — Telephone Encounter (Signed)
Refill synthroid #30 x 6 refills faxed to [facility] [phone] per Dr Gerrit Friends.

## 2012-04-03 ENCOUNTER — Telehealth (INDEPENDENT_AMBULATORY_CARE_PROVIDER_SITE_OTHER): Payer: Self-pay

## 2012-04-03 NOTE — Telephone Encounter (Signed)
Received request for refill synthroid. Sent to Dr Gerrit Friends for review.

## 2012-04-04 ENCOUNTER — Telehealth (INDEPENDENT_AMBULATORY_CARE_PROVIDER_SITE_OTHER): Payer: Self-pay

## 2012-04-04 NOTE — Telephone Encounter (Signed)
Refill synthroid with 6 add refills faxed to [facility] [phone] and confirmation received.

## 2012-10-01 ENCOUNTER — Ambulatory Visit (INDEPENDENT_AMBULATORY_CARE_PROVIDER_SITE_OTHER): Payer: Federal, State, Local not specified - PPO | Admitting: Surgery

## 2012-10-01 ENCOUNTER — Encounter (INDEPENDENT_AMBULATORY_CARE_PROVIDER_SITE_OTHER): Payer: Self-pay | Admitting: Surgery

## 2012-10-01 VITALS — BP 106/72 | HR 84 | Temp 97.8°F | Ht 65.0 in | Wt 164.0 lb

## 2012-10-01 DIAGNOSIS — E042 Nontoxic multinodular goiter: Secondary | ICD-10-CM

## 2012-10-01 DIAGNOSIS — E039 Hypothyroidism, unspecified: Secondary | ICD-10-CM

## 2012-10-01 NOTE — Progress Notes (Signed)
General Surgery [facility]  Chief Complaint  Patient presents with  . Follow-up    bilateral thyroid nodules    HISTORY: Patient is a 62 year old female followed for bilateral thyroid nodules. She was last evaluated 2 years ago. Ultrasound at that time showed small bilateral thyroid nodules which had actually decreased in size on thyroid hormone suppression. Patient is currently taking Synthroid 50 mcg daily. TSH level in August 2014 is normal at 0.992. Patient has had not had any further diagnostic studies.  PERTINENT REVIEW OF SYSTEMS: Denies tremor. Denies palpitation. Denies compressive symptoms. Denies any new masses.  EXAM: HEENT: normocephalic; pupils equal and reactive; sclerae clear; dentition good; mucous membranes moist NECK:  Slight nodularity left thyroid lobe without discrete or dominant mass; right thyroid lobe normal to palpation; symmetric on extension; no palpable anterior or posterior cervical lymphadenopathy; no supraclavicular masses; no tenderness CHEST: clear to auscultation bilaterally without rales, rhonchi, or wheezes CARDIAC: regular rate and rhythm without significant murmur; peripheral pulses are full EXT:  non-tender without edema; no deformity NEURO: no gross focal deficits; no sign of tremor   IMPRESSION: Bilateral thyroid nodules, clinically stable Hypothyroidism  PLAN: The patient and I discussed the above findings. We reviewed her laboratory studies. At this point there is nothing on her physical exam to warrant thyroid ultrasound or further diagnostic studies. Her TSH level is perfect at 0.992 on her current dose of Synthroid 50 mcg daily. I have recommended continuing this dosage indefinitely. She should have a TSH level checked once a year.  Patient will return for physical examination in 2 years.  Velora Heckler, MD, [facility] Office: [phone]  Visit Diagnoses: 1. Multinodular goiter  (nontoxic)   2. HYPOTHYROIDISM

## 2012-10-01 NOTE — Patient Instructions (Signed)

## 2012-10-28 ENCOUNTER — Other Ambulatory Visit (INDEPENDENT_AMBULATORY_CARE_PROVIDER_SITE_OTHER): Payer: Self-pay

## 2012-10-28 ENCOUNTER — Telehealth (INDEPENDENT_AMBULATORY_CARE_PROVIDER_SITE_OTHER): Payer: Self-pay

## 2012-10-28 DIAGNOSIS — E039 Hypothyroidism, unspecified: Secondary | ICD-10-CM

## 2012-10-28 MED ORDER — SYNTHROID 50 MCG PO TABS: 50.0000 ug | ORAL_TABLET | Freq: Every day | ORAL | Status: DC

## 2012-10-28 NOTE — Telephone Encounter (Signed)
Per Dr Ardine Eng request refill of synthroid 50 mcg #30 w/6 refills escribed to [facility] per fax request received.

## 2013-01-07 ENCOUNTER — Ambulatory Visit (INDEPENDENT_AMBULATORY_CARE_PROVIDER_SITE_OTHER): Payer: Federal, State, Local not specified - PPO | Admitting: Urology

## 2013-01-07 DIAGNOSIS — N8111 Cystocele, midline: Secondary | ICD-10-CM

## 2013-01-07 DIAGNOSIS — N816 Rectocele: Secondary | ICD-10-CM

## 2013-01-07 DIAGNOSIS — M25559 Pain in unspecified hip: Secondary | ICD-10-CM

## 2013-03-27 ENCOUNTER — Ambulatory Visit: Payer: Federal, State, Local not specified - PPO | Admitting: Gastroenterology

## 2013-04-17 ENCOUNTER — Ambulatory Visit (INDEPENDENT_AMBULATORY_CARE_PROVIDER_SITE_OTHER): Payer: Federal, State, Local not specified - PPO | Admitting: Gastroenterology

## 2013-04-17 ENCOUNTER — Encounter: Payer: Self-pay | Admitting: Gastroenterology

## 2013-04-17 VITALS — BP 122/78 | HR 64 | Temp 97.6°F | Ht 65.0 in | Wt 165.4 lb

## 2013-04-17 DIAGNOSIS — K5909 Other constipation: Secondary | ICD-10-CM

## 2013-04-17 MED ORDER — LINACLOTIDE 145 MCG PO CAPS: 145.0000 ug | ORAL_CAPSULE | Freq: Every day | ORAL | Status: DC

## 2013-04-17 NOTE — Patient Instructions (Signed)
Please complete the stool sample and return to our office.   I have provided a voucher for you to get a month supply of Linzess free. Take this once each morning on an empty stomach, at least 30 minutes before breakfast. Refills are at the pharmacy as well. This is in place of Miralax.  We will decide about a colonoscopy after the stool test is completed.

## 2013-04-17 NOTE — Progress Notes (Signed)
Referring Provider: Asencion Noble, MD Primary Care Physician:  Asencion Noble, MD Primary GI: Dr. Gala Romney  Chief Complaint  Patient presents with  . Constipation    HPI:   Eileen Green presents today at the request of Dr. Willey Blade secondary to change in bowel habits. Stool size of finger. Doesn't know if this was present in 2011. Stool caliber changed. Always gassy, airy, noisy. Thinks may be related to stress. Would wake up sometimes and be in pain. Hasn't happened in about 4-6 weeks. Feels like rectum is vibrating intermittently. Every now and then just a small twinge in RLQ, like a 0.5 on a scale of 1-10. Sometimes feels like bladder is hanging out vagina. Saw urologist and told had cystocele and rectocele. Miralax daily. Last colonoscopy in 2011 by Dr. Gala Romney with normal rectum, narrow-mouth left-sided diverticula. No rectal bleeding.    Past Medical History  Diagnosis Date  . GERD (gastroesophageal reflux disease)   . Hyperlipidemia   . Deaf     LEFT EAR  . Arthritis   . Cancer     skin melanoma under left eye    Past Surgical History  Procedure Laterality Date  . Tonsillectomy    . Abdominal hysterectomy  2001    Partial  . Esophagogastroduodenoscopy  01/21/2003    RMR:  Normal esophagus, stomach, D1 and D2  . Colonoscopy  01/21/2003    MWU:XLKGMW rectum/ Long capacious colon, moderate prep made the exam more challenging.  . Colonoscopy  05/18/2009    Dr. Gala Romney: Normal rectum/narrow mouth left sided diverticula     Current Outpatient Prescriptions  Medication Sig Dispense Refill  . Esomeprazole Magnesium (NEXIUM PO) Take 40 mg by mouth daily.       . hydrochlorothiazide (MICROZIDE) 12.5 MG capsule Take 12.5 mg by mouth daily.       . Lactobacillus (DIGESTIVE HEALTH PROBIOTIC PO) Take by mouth daily.        Marland Kitchen LISINOPRIL PO Take 10 mg by mouth at bedtime.       . Methylcellulose, Laxative, 500 MG TABS Take by mouth daily.        . Misc Natural Products (OSTEO BI-FLEX TRIPLE  STRENGTH PO) Take by mouth daily.        . Multiple Vitamin (MULTIVITAMIN) capsule Take 1 capsule by mouth at bedtime.       . polyethylene glycol (MIRALAX / GLYCOLAX) packet Take 17 g by mouth daily.      Marland Kitchen PRAVASTATIN SODIUM PO Take 10 mg by mouth daily.       Marland Kitchen SYNTHROID 50 MCG tablet Take 1 tablet (50 mcg total) by mouth daily.  30 tablet  6  . FLUZONE QUADRIVALENT 0.5 ML injection       . ZOSTAVAX 10272 UNT/0.65ML injection        No current facility-administered medications for this visit.    Allergies as of 04/17/2013  . (No Known Allergies)    Family History  Problem Relation Age of Onset  . Heart disease Mother   . Heart disease Father   . Kidney disease Father   . Thyroid nodules Father   . Dementia Mother   . Colon cancer Neg Hx     History   Social History  . Marital Status: Married    Spouse Name: N/A    Number of Children: N/A  . Years of Education: N/A   Occupational History  . retired     still teaches at [employer], Brewing technologist in  math   Social History Main Topics  . Smoking status: Never Smoker   . Smokeless tobacco: Never Used  . Alcohol Use: No  . Drug Use: No  . Sexual Activity: None   Other Topics Concern  . None   Social History Narrative  . None    Review of Systems: As mentioned in HPI.   Physical Exam: BP 122/78  Pulse 64  Temp(Src) 97.6 F (36.4 C) (Oral)  Ht 5\' 5"  (1.651 m)  Wt 165 lb 6.4 oz (75.025 kg)  BMI 27.52 kg/m2 General:   Alert and oriented. No distress noted. Pleasant and cooperative.  Head:  Normocephalic and atraumatic. Eyes:  Conjuctiva clear without scleral icterus. Mouth:  Oral mucosa pink and moist. Good dentition. No lesions. Neck:  Supple, without mass or thyromegaly. Heart:  S1, S2 present without murmurs, rubs, or gallops. Regular rate and rhythm. Abdomen:  +BS, soft, non-tender and non-distended. No rebound or guarding. No HSM or masses noted. Rectal: without mass on internal exam Msk:   Symmetrical without gross deformities. Normal posture. Extremities:  Without edema. Neurologic:  Alert and  oriented x4;  grossly normal neurologically. Skin:  Intact without significant lesions or rashes. Cervical Nodes:  No significant cervical adenopathy. Psych:  Alert and cooperative. Normal mood and affect.

## 2013-04-23 ENCOUNTER — Ambulatory Visit (INDEPENDENT_AMBULATORY_CARE_PROVIDER_SITE_OTHER): Payer: Federal, State, Local not specified - PPO | Admitting: Gastroenterology

## 2013-04-23 DIAGNOSIS — K5900 Constipation, unspecified: Secondary | ICD-10-CM

## 2013-04-23 LAB — IFOBT (OCCULT BLOOD): IMMUNOLOGICAL FECAL OCCULT BLOOD TEST: POSITIVE

## 2013-04-23 NOTE — Progress Notes (Signed)
Pt return IFOBT test and it was POSTIVE

## 2013-04-23 NOTE — Assessment & Plan Note (Signed)
63 year old female with chronic constipation, now with change in caliber of stools. No rectal bleeding or weight loss. Vague RLQ "twinges" at times but no outright pain. Last colonoscopy in 2011 and due again in 2021. However, she is quite concerned about possible occult malignancy. Will obtain routine ifobt, start Linzess 145 mcg daily, and proceed with colonoscopy if heme positive OR no improvement in constipation with Linzess. Risks and benefits discussed with patient of potential colonoscopy with Dr. Gala Romney if indicated; she is agreeable and understands this plan.

## 2013-04-24 NOTE — Progress Notes (Signed)
cc'd to pcp 

## 2013-05-05 ENCOUNTER — Other Ambulatory Visit: Payer: Self-pay | Admitting: Internal Medicine

## 2013-05-05 DIAGNOSIS — R194 Change in bowel habit: Secondary | ICD-10-CM

## 2013-05-05 MED ORDER — PEG 3350-KCL-NA BICARB-NACL 420 G PO SOLR: 4000.0000 mL | ORAL | Status: DC

## 2013-05-05 NOTE — Progress Notes (Signed)
Quick Note:  ifobt positive.  Needs colonoscopy with Dr. Gala Romney due to bowel habit changes. Last seen 4/16. We need to get her in before 30 days is up.   ______

## 2013-05-05 NOTE — Progress Notes (Signed)
Quick Note:  Is patient aware? ______

## 2013-05-06 ENCOUNTER — Encounter (HOSPITAL_COMMUNITY): Payer: Self-pay | Admitting: Pharmacy Technician

## 2013-05-14 ENCOUNTER — Ambulatory Visit (HOSPITAL_COMMUNITY)
Admission: RE | Admit: 2013-05-14 | Discharge: 2013-05-14 | Disposition: A | Discharge status: Home / Self Care | Payer: Federal, State, Local not specified - PPO | Source: Ambulatory Visit | Attending: Internal Medicine | Admitting: Internal Medicine

## 2013-05-14 ENCOUNTER — Encounter (HOSPITAL_COMMUNITY): Payer: Self-pay | Admitting: *Deleted

## 2013-05-14 ENCOUNTER — Encounter (HOSPITAL_COMMUNITY)
Admission: RE | Disposition: A | Discharge status: Home / Self Care | Payer: Self-pay | Source: Ambulatory Visit | Attending: Internal Medicine

## 2013-05-14 DIAGNOSIS — K649 Unspecified hemorrhoids: Secondary | ICD-10-CM

## 2013-05-14 DIAGNOSIS — K5730 Diverticulosis of large intestine without perforation or abscess without bleeding: Secondary | ICD-10-CM | POA: Insufficient documentation

## 2013-05-14 DIAGNOSIS — Q438 Other specified congenital malformations of intestine: Secondary | ICD-10-CM | POA: Insufficient documentation

## 2013-05-14 DIAGNOSIS — K5900 Constipation, unspecified: Secondary | ICD-10-CM | POA: Insufficient documentation

## 2013-05-14 DIAGNOSIS — K648 Other hemorrhoids: Secondary | ICD-10-CM | POA: Insufficient documentation

## 2013-05-14 DIAGNOSIS — R194 Change in bowel habit: Secondary | ICD-10-CM

## 2013-05-14 DIAGNOSIS — R195 Other fecal abnormalities: Secondary | ICD-10-CM | POA: Insufficient documentation

## 2013-05-14 DIAGNOSIS — R198 Other specified symptoms and signs involving the digestive system and abdomen: Secondary | ICD-10-CM

## 2013-05-14 HISTORY — PX: COLONOSCOPY: SHX5424

## 2013-05-14 SURGERY — COLONOSCOPY
Anesthesia: Moderate Sedation

## 2013-05-14 MED ORDER — SODIUM CHLORIDE 0.9 % IV SOLN
INTRAVENOUS | Status: DC
  Administered 2013-05-14: 13:00:00 via INTRAVENOUS

## 2013-05-14 MED ORDER — ONDANSETRON HCL 4 MG/2ML IJ SOLN
INTRAMUSCULAR | Status: AC
  Filled 2013-05-14: qty 2

## 2013-05-14 MED ORDER — ONDANSETRON HCL 4 MG/2ML IJ SOLN
INTRAMUSCULAR | Status: DC | PRN
  Administered 2013-05-14: 4 mg via INTRAVENOUS

## 2013-05-14 MED ORDER — MIDAZOLAM HCL 5 MG/5ML IJ SOLN
INTRAMUSCULAR | Status: DC | PRN
  Administered 2013-05-14 (×2): 2 mg via INTRAVENOUS

## 2013-05-14 MED ORDER — MIDAZOLAM HCL 5 MG/5ML IJ SOLN
INTRAMUSCULAR | Status: AC
  Filled 2013-05-14: qty 10

## 2013-05-14 MED ORDER — MEPERIDINE HCL 100 MG/ML IJ SOLN
INTRAMUSCULAR | Status: AC
  Filled 2013-05-14: qty 2

## 2013-05-14 MED ORDER — MEPERIDINE HCL 100 MG/ML IJ SOLN
INTRAMUSCULAR | Status: DC | PRN
  Administered 2013-05-14 (×2): 50 mg via INTRAVENOUS

## 2013-05-14 MED ORDER — SIMETHICONE 40 MG/0.6ML PO SUSP
ORAL | Status: DC | PRN
  Administered 2013-05-14: 14:00:00

## 2013-05-14 NOTE — Interval H&P Note (Signed)
History and Physical Interval Note:  05/14/2013 2:41 PM  Eileen Green  has presented today for surgery, with the diagnosis of CHANGE IN BOWEL HABITS  The various methods of treatment have been discussed with the patient and family. After consideration of risks, benefits and other options for treatment, the patient has consented to  Procedure(s) with comments: COLONOSCOPY (N/A) - 1:15-moved to 100 Eileen Green notified pt as a surgical intervention .  The patient's history has been reviewed, patient examined, no change in status, stable for surgery.  I have reviewed the patient's chart and labs.  Questions were answered to the patient's satisfaction.     Cristopher Estimable Shemicka Cohrs  No change. Colonoscopy per plan.The risks, benefits, limitations, alternatives and imponderables have been reviewed with the patient. Questions have been answered. All parties are agreeable.

## 2013-05-14 NOTE — H&P (View-Only) (Signed)
Quick Note:  ifobt positive.  Needs colonoscopy with Dr. Rourk due to bowel habit changes. Last seen 4/16. We need to get her in before 30 days is up.   ______ 

## 2013-05-14 NOTE — Op Note (Signed)
[facility] [address] [place], [address]   COLONOSCOPY PROCEDURE REPORT  PATIENT: Eileen Green, Eileen Green  MR#:         [id number] BIRTHDATE: [birth year] , 63  yrs. old GENDER: Female ENDOSCOPIST: R.  Garfield Cornea, MD FACP FACG REFERRED BY:  Asencion Noble, M.D. PROCEDURE DATE:  05/14/2013 PROCEDURE:     Colonoscopy-diagnostic  INDICATIONS: Change in bowel habits.; Constipation.; Doesn't like MiraLax;  occult blood positive stool  INFORMED CONSENT:  The risks, benefits, alternatives and imponderables including but not limited to bleeding, perforation as well as the possibility of a missed lesion have been reviewed.  The potential for biopsy, lesion removal, etc. have also been discussed.  Questions have been answered.  All parties agreeable. Please see the history and physical in the medical record for more information.  MEDICATIONS: Versed 4 mg IV and Demerol 100 mg IV in divided doses. Zofran 4 mg IV.  DESCRIPTION OF PROCEDURE:  After a digital rectal exam was performed, the EC-[id number] ([id number])  colonoscope was advanced from the anus through the rectum and colon to the area of the cecum, ileocecal valve and appendiceal orifice.  The cecum was deeply intubated.  These structures were well-seen and photographed for the record.  From the level of the cecum and ileocecal valve, the scope was slowly and cautiously withdrawn.  The mucosal surfaces were carefully surveyed utilizing scope tip deflection to facilitate fold flattening as needed.  The scope was pulled down into the rectum where a thorough examination including retroflexion was performed.    FINDINGS:  Adequate preparation. Internal hemorrhoids; otherwise, normal rectum. Redundant colon with scattered left-sided diverticula; the remainder of the colonic mucosa appeared normal.  THERAPEUTIC / DIAGNOSTIC MANEUVERS PERFORMED:  None  COMPLICATIONS: none  CECAL WITHDRAWAL TIME:  9 minutes  IMPRESSION:   Hemorrhoids. Colonic diverticulosis. Redundant colon  RECOMMENDATIONS:   Resume Linzess 145 daily; Add  Benefiber 2 teaspoons twice daily.; Office visit with [place] in 3 months   _______________________________ eSigned:  R. Garfield Cornea, MD FACP [facility] 05/14/2013 3:25 PM   CC:

## 2013-05-14 NOTE — Discharge Instructions (Addendum)
Colonoscopy Discharge Instructions  Read the instructions outlined below and refer to this sheet in the next few weeks. These discharge instructions provide you with general information on caring for yourself after you leave the hospital. Your doctor may also give you specific instructions. While your treatment has been planned according to the most current medical practices available, unavoidable complications occasionally occur. If you have any problems or questions after discharge, call Dr. Gala Romney at [phone]. ACTIVITY  You may resume your regular activity, but move at a slower pace for the next 24 hours.   Take frequent rest periods for the next 24 hours.   Walking will help get rid of the air and reduce the bloated feeling in your belly (abdomen).   No driving for 24 hours (because of the medicine (anesthesia) used during the test).    Do not sign any important legal documents or operate any machinery for 24 hours (because of the anesthesia used during the test).  NUTRITION  Drink plenty of fluids.   You may resume your normal diet as instructed by your doctor.   Begin with a light meal and progress to your normal diet. Heavy or fried foods are harder to digest and may make you feel sick to your stomach (nauseated).   Avoid alcoholic beverages for 24 hours or as instructed.  MEDICATIONS  You may resume your normal medications unless your doctor tells you otherwise.  WHAT YOU CAN EXPECT TODAY  Some feelings of bloating in the abdomen.   Passage of more gas than usual.   Spotting of blood in your stool or on the toilet paper.  IF YOU HAD POLYPS REMOVED DURING THE COLONOSCOPY:  No aspirin products for 7 days or as instructed.   No alcohol for 7 days or as instructed.   Eat a soft diet for the next 24 hours.  FINDING OUT THE RESULTS OF YOUR TEST Not all test results are available during your visit. If your test results are not back during the visit, make an appointment  with your caregiver to find out the results. Do not assume everything is normal if you have not heard from your caregiver or the medical facility. It is important for you to follow up on all of your test results.  SEEK IMMEDIATE MEDICAL ATTENTION IF:  You have more than a spotting of blood in your stool.   Your belly is swollen (abdominal distention).   You are nauseated or vomiting.   You have a temperature over 101.   You have abdominal pain or discomfort that is severe or gets worse throughout the day.    Diverticulosis and constipation information provided  Continue Linzess 145 daily  Benefiber 2 teaspoons twice daily  Office visit with [place] in 3 months  Diverticulosis Diverticulosis is a common condition that develops when small pouches (diverticula) form in the wall of the colon. The risk of diverticulosis increases with age. It happens more often in people who eat a low-fiber diet. Most individuals with diverticulosis have no symptoms. Those individuals with symptoms usually experience abdominal pain, constipation, or loose stools (diarrhea). HOME CARE INSTRUCTIONS   Increase the amount of fiber in your diet as directed by your caregiver or dietician. This may reduce symptoms of diverticulosis.  Your caregiver may recommend taking a dietary fiber supplement.  Drink at least 6 to 8 glasses of water each day to prevent constipation.  Try not to strain when you have a bowel movement.  Your caregiver may recommend avoiding  nuts and seeds to prevent complications, although this is still an uncertain benefit.  Only take over-the-counter or prescription medicines for pain, discomfort, or fever as directed by your caregiver. FOODS WITH HIGH FIBER CONTENT INCLUDE:  Fruits. Apple, peach, pear, tangerine, raisins, prunes.  Vegetables. Brussels sprouts, asparagus, broccoli, cabbage, carrot, cauliflower, romaine lettuce, spinach, summer squash, tomato, winter squash,  zucchini.  Starchy Vegetables. Baked beans, kidney beans, lima beans, split peas, lentils, potatoes (with skin).  Grains. Whole wheat bread, brown rice, bran flake cereal, plain oatmeal, white rice, shredded wheat, bran muffins. SEEK IMMEDIATE MEDICAL CARE IF:   You develop increasing pain or severe bloating.  You have an oral temperature above 102 F (38.9 C), not controlled by medicine.  You develop vomiting or bowel movements that are bloody or black. Constipation, Adult Constipation is when a person has fewer than 3 bowel movements a week; has difficulty having a bowel movement; or has stools that are dry, hard, or larger than normal. As people grow older, constipation is more common. If you try to fix constipation with medicines that make you have a bowel movement (laxatives), the problem may get worse. Long-term laxative use may cause the muscles of the colon to become weak. A low-fiber diet, not taking in enough fluids, and taking certain medicines may make constipation worse. CAUSES   Certain medicines, such as antidepressants, pain medicine, iron supplements, antacids, and water pills.   Certain diseases, such as diabetes, irritable bowel syndrome (IBS), thyroid disease, or depression.   Not drinking enough water.   Not eating enough fiber-rich foods.   Stress or travel.  Lack of physical activity or exercise.  Not going to the restroom when there is the urge to have a bowel movement.  Ignoring the urge to have a bowel movement.  Using laxatives too much. SYMPTOMS   Having fewer than 3 bowel movements a week.   Straining to have a bowel movement.   Having hard, dry, or larger than normal stools.   Feeling full or bloated.   Pain in the lower abdomen.  Not feeling relief after having a bowel movement. DIAGNOSIS  Your caregiver will take a medical history and perform a physical exam. Further testing may be done for severe constipation. Some tests may  include:   A barium enema X-ray to examine your rectum, colon, and sometimes, your small intestine.  A sigmoidoscopy to examine your lower colon.  A colonoscopy to examine your entire colon. TREATMENT  Treatment will depend on the severity of your constipation and what is causing it. Some dietary treatments include drinking more fluids and eating more fiber-rich foods. Lifestyle treatments may include regular exercise. If these diet and lifestyle recommendations do not help, your caregiver may recommend taking over-the-counter laxative medicines to help you have bowel movements. Prescription medicines may be prescribed if over-the-counter medicines do not work.  HOME CARE INSTRUCTIONS   Increase dietary fiber in your diet, such as fruits, vegetables, whole grains, and beans. Limit high-fat and processed sugars in your diet, such as [place] fries, hamburgers, cookies, candies, and soda.   A fiber supplement may be added to your diet if you cannot get enough fiber from foods.   Drink enough fluids to keep your urine clear or pale yellow.   Exercise regularly or as directed by your caregiver.   Go to the restroom when you have the urge to go. Do not hold it.  Only take medicines as directed by your caregiver. Do not take other  medicines for constipation without talking to your caregiver first. [place] IF:   You have bright red blood in your stool.   Your constipation lasts for more than 4 days or gets worse.   You have abdominal or rectal pain.   You have thin, pencil-like stools.  You have unexplained weight loss. MAKE SURE YOU:   Understand these instructions.  Will watch your condition.  Will get help right away if you are not doing well or get worse. Document Released: 09/17/2003 Document Revised: 03/13/2011 Document Reviewed: 09/30/2012 [facility] Patient Information 2014 [place], [place].

## 2013-05-16 ENCOUNTER — Encounter (HOSPITAL_COMMUNITY): Payer: Self-pay | Admitting: Internal Medicine

## 2013-05-19 ENCOUNTER — Telehealth: Payer: Self-pay | Admitting: Internal Medicine

## 2013-05-19 NOTE — Telephone Encounter (Signed)
Patient was in at noon today to pick up a coupon for Linzess, her insurance wont allow her to use more than one. Her copay is $123.00 without coupon. Can we please come up with any other suggestions?

## 2013-05-19 NOTE — Telephone Encounter (Signed)
Tried to call pt- LM with female to call back. copay Savings card at the front desk for pt to pick up. Not sure if it will work with her insurance or not but she could try it and let us know if it doesn't help her copay

## 2013-05-20 ENCOUNTER — Telehealth: Payer: Self-pay | Admitting: Internal Medicine

## 2013-05-20 NOTE — Telephone Encounter (Signed)
Let's trial Amitiza 24 mcg po BID; may offer samples.

## 2013-05-20 NOTE — Telephone Encounter (Signed)
Pt cannot afford Linzess and cannot use a copay card with her insurance. Routing to AS for further recommendations.

## 2013-05-20 NOTE — Telephone Encounter (Signed)
Pt came to pick up savings card, looked at it and said we needed to regroup because the restrictions on the card says that the type of insurance she has [employer]/film [employer]) isn't covered. Please advise and call patient.

## 2013-05-21 MED ORDER — LUBIPROSTONE 24 MCG PO CAPS: 24.0000 ug | ORAL_CAPSULE | Freq: Two times a day (BID) | ORAL | Status: DC

## 2013-05-21 NOTE — Telephone Encounter (Signed)
Pt is calling back. She likes the miralax better because it worked for her. She said that she would try the Amitiza but she is wounding if her insurance will pay for it. She is wanting to know if she could just go back to the miralax daily of will it hurt her to take it everyday. Please advise

## 2013-05-21 NOTE — Telephone Encounter (Signed)
I sent Amitiza 24 mcg BID to her pharmacy to see if coverage is an issue.   I recommend still trying the samples of Amitiza. It should be covered.   If she does not like this or it doesn't work, can return to [facility] daily. It is not uncommon for some patients to take this indefinitely. However, I would prefer her be on Amitiza (or Linzess if it was covered).

## 2013-05-21 NOTE — Telephone Encounter (Signed)
Pt is willing to try the Amitiza but we are out of samples right now. Her co-pay will be $93.00

## 2013-05-21 NOTE — Addendum Note (Signed)
Addended by: Orvil Feil on: 05/21/2013 09:45 AM   Modules accepted: Orders

## 2013-05-22 ENCOUNTER — Telehealth (INDEPENDENT_AMBULATORY_CARE_PROVIDER_SITE_OTHER): Payer: Self-pay

## 2013-05-22 NOTE — Telephone Encounter (Signed)
Pharmacy call with refill request on Synthroid (their fax machine is broken).  Will Dr. Harlow Asa refill or PCP?  Please advise.

## 2013-05-27 NOTE — Telephone Encounter (Signed)
Gave pt #4 boxes of Amitiza 27mcg with instructions- take twice a day and take with food.

## 2013-06-04 ENCOUNTER — Other Ambulatory Visit (INDEPENDENT_AMBULATORY_CARE_PROVIDER_SITE_OTHER): Payer: Self-pay

## 2013-06-04 DIAGNOSIS — E039 Hypothyroidism, unspecified: Secondary | ICD-10-CM

## 2013-06-04 MED ORDER — SYNTHROID 50 MCG PO TABS: 50.0000 ug | ORAL_TABLET | Freq: Every day | ORAL | Status: DC

## 2013-06-04 NOTE — Telephone Encounter (Signed)
Refill for synthroid 50 mcg #30 with 11 refills sent via epic to [facility] per Dr Gala Lewandowsky order.

## 2013-06-25 ENCOUNTER — Encounter: Payer: Self-pay | Admitting: Gastroenterology

## 2013-08-07 ENCOUNTER — Encounter: Payer: Self-pay | Admitting: Gastroenterology

## 2013-08-07 ENCOUNTER — Ambulatory Visit (INDEPENDENT_AMBULATORY_CARE_PROVIDER_SITE_OTHER): Payer: Federal, State, Local not specified - PPO | Admitting: Gastroenterology

## 2013-08-07 ENCOUNTER — Encounter (INDEPENDENT_AMBULATORY_CARE_PROVIDER_SITE_OTHER): Payer: Self-pay

## 2013-08-07 VITALS — BP 130/82 | HR 84 | Temp 98.0°F | Ht 66.0 in | Wt 173.2 lb

## 2013-08-07 DIAGNOSIS — K219 Gastro-esophageal reflux disease without esophagitis: Secondary | ICD-10-CM

## 2013-08-07 DIAGNOSIS — R1031 Right lower quadrant pain: Secondary | ICD-10-CM

## 2013-08-07 LAB — BASIC METABOLIC PANEL
BUN: 13 mg/dL (ref 6–23)
CO2: 29 mEq/L (ref 19–32)
Calcium: 9.7 mg/dL (ref 8.4–10.5)
Chloride: 104 mEq/L (ref 96–112)
Creat: 0.88 mg/dL (ref 0.50–1.10)
Glucose, Bld: 107 mg/dL — ABNORMAL HIGH (ref 70–99)
POTASSIUM: 4.1 meq/L (ref 3.5–5.3)
SODIUM: 139 meq/L (ref 135–145)

## 2013-08-07 MED ORDER — PANTOPRAZOLE SODIUM 40 MG PO TBEC: 40.0000 mg | DELAYED_RELEASE_TABLET | Freq: Every day | ORAL | Status: DC

## 2013-08-07 NOTE — Progress Notes (Signed)
Referring Provider: Asencion Noble, MD Primary Care Physician:  Asencion Noble, MD Primary GI: Dr. Gala Romney   Chief Complaint  Patient presents with  . Follow-up    Pain in right side still intermittent    HPI:   Eileen Green presents today in follow-up after colonoscopy. No significant findings noted. Hx of RLQ discomfort. Didn't like Linzess. Amitiza too expensive. Still with RLQ discomfort. Movements not as stringy or small. Wants to go back to Miralax. Took Miralax last Tuesday evening. Passed a huge chunk of BM the other day followed by liquid. Feels bloated, constipated but does not have the urge to go. Some intermittent RLQ twinges/discomfort. Sometimes catches during the day. Not a constant pain. No significant correlating features that exacerbate the pain. Saw GYN recently without significant findings.   Feels like food isn't digesting as well. Taking more Tums. Symptoms started with recent fill of generic Nexium. Gained weight, eating more.   Past Medical History  Diagnosis Date  . GERD (gastroesophageal reflux disease)   . Hyperlipidemia   . Deaf     LEFT EAR  . Arthritis   . Cancer     skin melanoma under left eye    Past Surgical History  Procedure Laterality Date  . Tonsillectomy    . Abdominal hysterectomy  2001    Partial  . Esophagogastroduodenoscopy  01/21/2003    RMR:  Normal esophagus, stomach, D1 and D2  . Colonoscopy  01/21/2003    JAS:NKNLZJ rectum/ Long capacious colon, moderate prep made the exam more challenging.  . Colonoscopy  05/18/2009    Dr. Gala Romney: Normal rectum/narrow mouth left sided diverticula   . Colonoscopy N/A 05/14/2013    Dr. Gala Romney: hemorrhoids, colonic diverticula, redudant colon    Current Outpatient Prescriptions  Medication Sig Dispense Refill  . esomeprazole (NEXIUM) 40 MG capsule Take 40 mg by mouth daily. 30 min before breakfast      . hydrochlorothiazide (MICROZIDE) 12.5 MG capsule Take 12.5 mg by mouth daily.       .  Lactobacillus (DIGESTIVE HEALTH PROBIOTIC PO) Take 1 tablet by mouth daily.       Marland Kitchen lisinopril (PRINIVIL,ZESTRIL) 10 MG tablet Take 10 mg by mouth daily.      Marland Kitchen lubiprostone (AMITIZA) 24 MCG capsule Take 1 capsule (24 mcg total) by mouth 2 (two) times daily with a meal.  60 capsule  3  . Misc Natural Products (OSTEO BI-FLEX TRIPLE STRENGTH PO) Take by mouth daily.        . Multiple Vitamin (MULTIVITAMIN) capsule Take 1 capsule by mouth at bedtime.       Marland Kitchen PRAVASTATIN SODIUM PO Take 10 mg by mouth daily.       Marland Kitchen SYNTHROID 50 MCG tablet Take 1 tablet (50 mcg total) by mouth daily.  30 tablet  11  . Wheat Dextrin (BENEFIBER PO) Take by mouth 2 (two) times daily.      . pantoprazole (PROTONIX) 40 MG tablet Take 1 tablet (40 mg total) by mouth daily. Take 30 minutes prior to breakfast  30 tablet  3   No current facility-administered medications for this visit.    Allergies as of 08/07/2013  . (No Known Allergies)    Family History  Problem Relation Age of Onset  . Heart disease Mother   . Heart disease Father   . Kidney disease Father   . Thyroid nodules Father   . Dementia Mother   . Colon cancer Neg Hx  History   Social History  . Marital Status: Married    Spouse Name: N/A    Number of Children: N/A  . Years of Education: N/A   Occupational History  . retired     still teaches at [employer], Brewing technologist in [place]   Social History Main Topics  . Smoking status: Never Smoker   . Smokeless tobacco: Never Used     Comment: Never smoked  . Alcohol Use: No  . Drug Use: No  . Sexual Activity: None   Other Topics Concern  . None   Social History Narrative  . None    Review of Systems: As mentioned in HPI.   Physical Exam: BP 130/82  Pulse 84  Temp(Src) 98 F (36.7 C) (Oral)  Ht 5\' 6"  (1.676 m)  Wt 173 lb 3.2 oz (78.563 kg)  BMI 27.97 kg/m2 General:   Alert and oriented. No distress noted. Pleasant and cooperative.  Head:  Normocephalic and  atraumatic. Eyes:  Conjuctiva clear without scleral icterus. Mouth:  Oral mucosa pink and moist. Good dentition. No lesions. Heart:  S1, S2 present without murmurs, rubs, or gallops. Regular rate and rhythm. Abdomen:  +BS, soft, non-tender and non-distended. No rebound or guarding. No HSM or masses noted. Msk:  Symmetrical without gross deformities. Normal posture. Extremities:  Without edema. Neurologic:  Alert and  oriented x4;  grossly normal neurologically. Skin:  Intact without significant lesions or rashes. Psych:  Alert and cooperative. Normal mood and affect.

## 2013-08-07 NOTE — Patient Instructions (Signed)
Once you complete Nexium dosing, trial Protonix once each morning, 30 minutes before breakfast.   You may take Miralax daily as needed for constipation.   We will see you back in 6 months.    Food Choices for Gastroesophageal Reflux Disease When you have gastroesophageal reflux disease (GERD), the foods you eat and your eating habits are very important. Choosing the right foods can help ease the discomfort of GERD. WHAT GENERAL GUIDELINES DO I NEED TO FOLLOW?  Choose fruits, vegetables, whole grains, low-fat dairy products, and low-fat meat, fish, and poultry.  Limit fats such as oils, salad dressings, butter, nuts, and avocado.  Keep a food diary to identify foods that cause symptoms.  Avoid foods that cause reflux. These may be different for different people.  Eat frequent small meals instead of three large meals each day.  Eat your meals slowly, in a relaxed setting.  Limit fried foods.  Cook foods using methods other than frying.  Avoid drinking alcohol.  Avoid drinking large amounts of liquids with your meals.  Avoid bending over or lying down until 2-3 hours after eating. WHAT FOODS ARE NOT RECOMMENDED? The following are some foods and drinks that may worsen your symptoms: Vegetables Tomatoes. Tomato juice. Tomato and spaghetti sauce. Chili peppers. Onion and garlic. Horseradish. Fruits Oranges, grapefruit, and lemon (fruit and juice). Meats High-fat meats, fish, and poultry. This includes hot dogs, ribs, ham, sausage, salami, and bacon. Dairy Whole milk and chocolate milk. Sour cream. Cream. Butter. Ice cream. Cream cheese.  Beverages Coffee and tea, with or without caffeine. Carbonated beverages or energy drinks. Condiments Hot sauce. Barbecue sauce.  Sweets/Desserts Chocolate and cocoa. Donuts. Peppermint and spearmint. Fats and Oils High-fat foods, including [place] fries and potato chips. Other Vinegar. Strong spices, such as black pepper, white pepper,  red pepper, cayenne, curry powder, cloves, ginger, and chili powder. The items listed above may not be a complete list of foods and beverages to avoid. Contact your dietitian for more information. Document Released: 12/19/2004 Document Revised: 12/24/2012 Document Reviewed: 10/23/2012 [facility] Patient Information 2015 [place], [place]. This information is not intended to replace advice given to you by your health care provider. Make sure you discuss any questions you have with your health care provider.

## 2013-08-08 ENCOUNTER — Encounter: Payer: Self-pay | Admitting: Gastroenterology

## 2013-08-08 DIAGNOSIS — R1031 Right lower quadrant pain: Secondary | ICD-10-CM | POA: Insufficient documentation

## 2013-08-08 NOTE — Assessment & Plan Note (Signed)
Intermittent without exacerbating or relieving factors. Colonoscopy on revealing. Chronic constipation could be contributing; however, will proceed with CT abd/pelvis now for further evaluation. 6 month return.

## 2013-08-08 NOTE — Assessment & Plan Note (Signed)
>>  ASSESSMENT AND PLAN FOR GERD WRITTEN ON 08/08/2013 10:53 PM BY SAMS, Lessie Dings, NP  Some exacerbation of GERD in setting of weight gan and dietary choices. Stop Nexium. Start Protonix daily.

## 2013-08-08 NOTE — Progress Notes (Signed)
Pt is aware of CT scan on August 18 @ 230. No PA was needed.

## 2013-08-08 NOTE — Assessment & Plan Note (Signed)
Some exacerbation of GERD in setting of weight gan and dietary choices. Stop Nexium. Start Protonix daily.

## 2013-08-11 NOTE — Progress Notes (Signed)
cc'd to pcp 

## 2013-08-14 ENCOUNTER — Ambulatory Visit: Payer: Federal, State, Local not specified - PPO | Admitting: Gastroenterology

## 2013-08-18 ENCOUNTER — Other Ambulatory Visit (HOSPITAL_COMMUNITY): Payer: Federal, State, Local not specified - PPO

## 2013-08-19 ENCOUNTER — Encounter (HOSPITAL_COMMUNITY): Payer: Self-pay

## 2013-08-19 ENCOUNTER — Ambulatory Visit (HOSPITAL_COMMUNITY)
Admission: RE | Admit: 2013-08-19 | Discharge: 2013-08-19 | Disposition: A | Discharge status: Home / Self Care | Payer: Federal, State, Local not specified - PPO | Source: Ambulatory Visit | Attending: Gastroenterology | Admitting: Gastroenterology

## 2013-08-19 DIAGNOSIS — R1031 Right lower quadrant pain: Secondary | ICD-10-CM | POA: Diagnosis present

## 2013-08-19 MED ORDER — IOHEXOL 300 MG/ML  SOLN
100.0000 mL | Freq: Once | INTRAMUSCULAR | Status: AC | PRN
  Administered 2013-08-19: 100 mL via INTRAVENOUS

## 2013-08-27 NOTE — Progress Notes (Signed)
Quick Note:  Moderate stool burden noted.  Stable lung nodule and liver hemangiomas noted since Jan 2005.  No explanation for RLQ; could have element of constipation playing a role.  How is she? ______

## 2013-08-28 ENCOUNTER — Telehealth: Payer: Self-pay | Admitting: Internal Medicine

## 2013-08-28 NOTE — Telephone Encounter (Signed)
PATIENT CALLED WANTING RESULTS FROM LAB WORK AND CT DONE 08/2013 [phone]

## 2013-08-28 NOTE — Telephone Encounter (Signed)
Tried to call pt- NA and voicemail has not been set up yet.

## 2013-09-01 NOTE — Telephone Encounter (Signed)
Pt is aware of her ct results. She said she switched from [place] to miralax, while she was on vacation about a week before her ct. She said she feels full most of the time. She is taking benefiber bid, once with breakfast and once in the evenings. She is taking the benefiber and miralax at the same time. She said she has been having a pretty good bm almost everyday, but some days she feels about 6 months pregnant. She has not changed her diet and has not been exercising and she wonders if that could be part of the problem. Her pain on her R side is not as bad as it was before.   Pt wants to know if there is anything else she can do?

## 2013-09-02 NOTE — Telephone Encounter (Signed)
I would definitely trial change of diet and exercise regularly.  Colonoscopy on file. CT without concerning findings.  If persistent bloating, could consider HBT.

## 2013-09-03 NOTE — Telephone Encounter (Signed)
Pt is aware. I have sent her some diet information in the mail. She will call back if she continues to have problems despite diet and exercise.

## 2013-12-13 ENCOUNTER — Other Ambulatory Visit: Payer: Self-pay | Admitting: Gastroenterology

## 2014-02-03 ENCOUNTER — Encounter: Payer: Self-pay | Admitting: Internal Medicine

## 2014-03-04 ENCOUNTER — Ambulatory Visit: Payer: Federal, State, Local not specified - PPO | Admitting: Gastroenterology

## 2014-03-18 ENCOUNTER — Other Ambulatory Visit: Payer: Self-pay

## 2014-03-18 ENCOUNTER — Ambulatory Visit (INDEPENDENT_AMBULATORY_CARE_PROVIDER_SITE_OTHER): Payer: Federal, State, Local not specified - PPO | Admitting: Gastroenterology

## 2014-03-18 ENCOUNTER — Encounter: Payer: Self-pay | Admitting: Gastroenterology

## 2014-03-18 VITALS — BP 115/74 | HR 82 | Temp 98.2°F | Ht 65.0 in | Wt 176.4 lb

## 2014-03-18 DIAGNOSIS — R1013 Epigastric pain: Secondary | ICD-10-CM

## 2014-03-18 NOTE — Patient Instructions (Signed)
We have scheduled you for an upper endoscopy with Dr. Gala Romney in the near future.   You may need further testing after this. Further recommendations to follow!

## 2014-03-18 NOTE — Progress Notes (Signed)
Referring Provider: Asencion Noble, MD Primary Care Physician:  Asencion Noble, MD  Primary GI: Dr. Gala Romney   Chief Complaint  Patient presents with  . Follow-up    HPI:   Eileen Green is a 64 y.o. female presenting today with a history of constipation, RLQ pain. Last seen Aug 2015 with some GERD symptoms, CT abd/pelvis completed due to persistent RLQ pain but unrevealing colonoscopy. CT normal.   Returns today stating she feels like sometimes everything she eats gives her a sour stomach. Feels "overfull". Early satiety. Feels like food is not digesting. No exercise. Will eat cereal for breakfast and full for hours thereafter. Sometimes difficulty just initiating a swallow in the morning with water but no esophageal dysphagia. Has tried Aciphex, Nexium, Protonix. Would like to stay on Protonix for now until after the EGD. Last EGD in 2005.   RLQ discomfort sometimes associated with gas. Taking Miralax prn. Taking supplemental fiber.   Past Medical History  Diagnosis Date  . GERD (gastroesophageal reflux disease)   . Hyperlipidemia   . Deaf     LEFT EAR  . Arthritis   . Cancer     skin melanoma under left eye    Past Surgical History  Procedure Laterality Date  . Tonsillectomy    . Abdominal hysterectomy  2001    Partial  . Esophagogastroduodenoscopy  01/21/2003    RMR:  Normal esophagus, stomach, D1 and D2  . Colonoscopy  01/21/2003    BPZ:WCHENI rectum/ Long capacious colon, moderate prep made the exam more challenging.  . Colonoscopy  05/18/2009    Dr. Gala Romney: Normal rectum/narrow mouth left sided diverticula   . Colonoscopy N/A 05/14/2013    Dr. Gala Romney: hemorrhoids, colonic diverticula, redudant colon    Current Outpatient Prescriptions  Medication Sig Dispense Refill  . hydrochlorothiazide (MICROZIDE) 12.5 MG capsule Take 12.5 mg by mouth daily.     . Lactobacillus (DIGESTIVE HEALTH PROBIOTIC PO) Take 1 tablet by mouth daily.     Marland Kitchen lisinopril (PRINIVIL,ZESTRIL) 10 MG tablet  Take 10 mg by mouth daily.    . Misc Natural Products (OSTEO BI-FLEX TRIPLE STRENGTH PO) Take by mouth daily.      . Multiple Vitamin (MULTIVITAMIN) capsule Take 1 capsule by mouth at bedtime.     . pantoprazole (PROTONIX) 40 MG tablet take 1 tablet by mouth every morning 30 MINUTES PRIOR TO BREAKFAST 30 tablet 3  . polyethylene glycol (MIRALAX / GLYCOLAX) packet Take 17 g by mouth daily.    Marland Kitchen PRAVASTATIN SODIUM PO Take 10 mg by mouth daily.     Marland Kitchen SYNTHROID 50 MCG tablet Take 1 tablet (50 mcg total) by mouth daily. 30 tablet 11  . Wheat Dextrin (BENEFIBER PO) Take by mouth 2 (two) times daily.     No current facility-administered medications for this visit.    Allergies as of 03/18/2014  . (No Known Allergies)    Family History  Problem Relation Age of Onset  . Heart disease Mother   . Heart disease Father   . Kidney disease Father   . Thyroid nodules Father   . Dementia Mother   . Colon cancer Neg Hx     History   Social History  . Marital Status: Married    Spouse Name: N/A  . Number of Children: N/A  . Years of Education: N/A   Occupational History  . 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 Smoking status: Never Smoker   . Smokeless tobacco: Never Used     Comment: Never smoked  . Alcohol Use: No  . Drug Use: No  . Sexual Activity: Not on file   Other Topics Concern  . None   Social History Narrative    Review of Systems: Negative unless mentioned in HPI  Physical Exam: BP 115/74 mmHg  Pulse 82  Temp(Src) 98.2 F (36.8 C) (Oral)  Ht 5\' 5"  (1.651 m)  Wt 176 lb 6.4 oz (80.015 kg)  BMI 29.35 kg/m2 General:   Alert and oriented. No distress noted. Pleasant and cooperative.  Head:  Normocephalic and atraumatic. Eyes:  Conjuctiva clear without scleral icterus. Mouth:  Oral mucosa pink and moist. Good dentition. No lesions. Heart:  S1, S2 present without murmurs, rubs, or gallops. Regular rate and  rhythm. Abdomen:  +BS, soft, non-tender and non-distended. No rebound or guarding. No HSM or masses noted. Msk:  Symmetrical without gross deformities. Normal posture. Extremities:  Without edema. Neurologic:  Alert and  oriented x4;  grossly normal neurologically. Skin:  Intact without significant lesions or rashes. Psych:  Alert and cooperative. Normal mood and affect.

## 2014-03-23 ENCOUNTER — Telehealth: Payer: Self-pay | Admitting: Internal Medicine

## 2014-03-23 NOTE — Assessment & Plan Note (Signed)
64 year old female with early satiety, nausea, vague symptoms of "food not digesting". On Protonix for chronic GERD. Gallbladder remains in situ. No epigastric pain per se. With last EGD in 2005, would recommend starting off with repeat EGD; if this is negative, could pursue GES.   Continue Protonix once daily; may need to change PPIs. Patient would like to hold off on this currently until after EGD. (previously on Aciphex, Nexium, Protonix).  Proceed with upper endoscopy in the near future with Dr. Gala Romney. The risks, benefits, and alternatives have been discussed in detail with patient. They have stated understanding and desire to proceed.

## 2014-03-23 NOTE — Telephone Encounter (Signed)
Pt called to say that she was seen last week and scheduled a procedure for 4/11 with RMR. She is having a bad time with her reflux and was asking if she could move this date up to something sooner. Please advise and she asked if someone could call her within 30 minutes. I told her that I wasn't sure how long it would be but I would let her know. Please call [phone]

## 2014-03-23 NOTE — Telephone Encounter (Signed)
Pt is set up for 03/24/2014. Pt is aware and know what time to arrive.

## 2014-03-24 ENCOUNTER — Encounter (HOSPITAL_COMMUNITY)
Admission: RE | Disposition: A | Discharge status: Home / Self Care | Payer: Self-pay | Source: Ambulatory Visit | Attending: Internal Medicine

## 2014-03-24 ENCOUNTER — Telehealth: Payer: Self-pay

## 2014-03-24 ENCOUNTER — Ambulatory Visit (HOSPITAL_COMMUNITY)
Admission: RE | Admit: 2014-03-24 | Discharge: 2014-03-24 | Disposition: A | Discharge status: Home / Self Care | Payer: Federal, State, Local not specified - PPO | Source: Ambulatory Visit | Attending: Internal Medicine | Admitting: Internal Medicine

## 2014-03-24 ENCOUNTER — Encounter (HOSPITAL_COMMUNITY): Payer: Self-pay | Admitting: *Deleted

## 2014-03-24 DIAGNOSIS — Z90710 Acquired absence of both cervix and uterus: Secondary | ICD-10-CM | POA: Insufficient documentation

## 2014-03-24 DIAGNOSIS — H9192 Unspecified hearing loss, left ear: Secondary | ICD-10-CM | POA: Insufficient documentation

## 2014-03-24 DIAGNOSIS — Z85820 Personal history of malignant melanoma of skin: Secondary | ICD-10-CM | POA: Diagnosis not present

## 2014-03-24 DIAGNOSIS — M1990 Unspecified osteoarthritis, unspecified site: Secondary | ICD-10-CM | POA: Insufficient documentation

## 2014-03-24 DIAGNOSIS — E785 Hyperlipidemia, unspecified: Secondary | ICD-10-CM | POA: Diagnosis not present

## 2014-03-24 DIAGNOSIS — R1013 Epigastric pain: Secondary | ICD-10-CM | POA: Diagnosis not present

## 2014-03-24 DIAGNOSIS — K219 Gastro-esophageal reflux disease without esophagitis: Secondary | ICD-10-CM | POA: Diagnosis not present

## 2014-03-24 DIAGNOSIS — K449 Diaphragmatic hernia without obstruction or gangrene: Secondary | ICD-10-CM | POA: Insufficient documentation

## 2014-03-24 HISTORY — PX: ESOPHAGOGASTRODUODENOSCOPY: SHX5428

## 2014-03-24 SURGERY — EGD (ESOPHAGOGASTRODUODENOSCOPY)
Anesthesia: Moderate Sedation

## 2014-03-24 MED ORDER — ONDANSETRON HCL 4 MG/2ML IJ SOLN
INTRAMUSCULAR | Status: DC | PRN
  Administered 2014-03-24: 4 mg via INTRAVENOUS

## 2014-03-24 MED ORDER — SODIUM CHLORIDE 0.9 % IV SOLN
INTRAVENOUS | Status: DC
  Administered 2014-03-24: 1000 mL via INTRAVENOUS

## 2014-03-24 MED ORDER — MIDAZOLAM HCL 5 MG/5ML IJ SOLN
INTRAMUSCULAR | Status: DC | PRN
  Administered 2014-03-24 (×2): 2 mg via INTRAVENOUS
  Administered 2014-03-24: 1 mg via INTRAVENOUS

## 2014-03-24 MED ORDER — MIDAZOLAM HCL 5 MG/5ML IJ SOLN
INTRAMUSCULAR | Status: AC
  Filled 2014-03-24: qty 10

## 2014-03-24 MED ORDER — MEPERIDINE HCL 100 MG/ML IJ SOLN
INTRAMUSCULAR | Status: DC | PRN
  Administered 2014-03-24: 50 mg via INTRAVENOUS
  Administered 2014-03-24: 25 mg via INTRAVENOUS

## 2014-03-24 MED ORDER — LIDOCAINE VISCOUS 2 % MT SOLN
OROMUCOSAL | Status: DC | PRN
  Administered 2014-03-24: 3 mL via OROMUCOSAL

## 2014-03-24 MED ORDER — STERILE WATER FOR IRRIGATION IR SOLN
Status: DC | PRN
  Administered 2014-03-24: 13:00:00

## 2014-03-24 MED ORDER — MEPERIDINE HCL 100 MG/ML IJ SOLN
INTRAMUSCULAR | Status: AC
  Filled 2014-03-24: qty 2

## 2014-03-24 MED ORDER — ONDANSETRON HCL 4 MG/2ML IJ SOLN
INTRAMUSCULAR | Status: AC
  Filled 2014-03-24: qty 2

## 2014-03-24 MED ORDER — LIDOCAINE VISCOUS 2 % MT SOLN
OROMUCOSAL | Status: AC
  Filled 2014-03-24: qty 15

## 2014-03-24 NOTE — Interval H&P Note (Signed)
History and Physical Interval Note:  03/24/2014 1:25 PM  Eileen Green  has presented today for surgery, with the diagnosis of DYSPEPSIA  The various methods of treatment have been discussed with the patient and family. After consideration of risks, benefits and other options for treatment, the patient has consented to  Procedure(s) with comments: ESOPHAGOGASTRODUODENOSCOPY (EGD) (N/A) - 745 - moved to 3/22 @ 12:45 as a surgical intervention .  The patient's history has been reviewed, patient examined, no change in status, stable for surgery.  I have reviewed the patient's chart and labs.  Questions were answered to the patient's satisfaction.     Avyukth Bontempo  No change. Diagnostic EGD per plan.  The risks, benefits, limitations, alternatives and imponderables have been reviewed with the patient. Potential for esophageal dilation, biopsy, etc. have also been reviewed.  Questions have been answered. All parties agreeable.

## 2014-03-24 NOTE — Discharge Instructions (Addendum)
EGD Discharge instructions Please read the instructions outlined below and refer to this sheet in the next few weeks. These discharge instructions provide you with general information on caring for yourself after you leave the hospital. Your doctor may also give you specific instructions. While your treatment has been planned according to the most current medical practices available, unavoidable complications occasionally occur. If you have any problems or questions after discharge, please call your doctor. ACTIVITY  You may resume your regular activity but move at a slower pace for the next 24 hours.   Take frequent rest periods for the next 24 hours.   Walking will help expel (get rid of) the air and reduce the bloated feeling in your abdomen.   No driving for 24 hours (because of the anesthesia (medicine) used during the test).   You may shower.   Do not sign any important legal documents or operate any machinery for 24 hours (because of the anesthesia used during the test).  NUTRITION  Drink plenty of fluids.   You may resume your normal diet.   Begin with a light meal and progress to your normal diet.   Avoid alcoholic beverages for 24 hours or as instructed by your caregiver.  MEDICATIONS  You may resume your normal medications unless your caregiver tells you otherwise.  WHAT YOU CAN EXPECT TODAY  You may experience abdominal discomfort such as a feeling of fullness or gas pains.  FOLLOW-UP  Your doctor will discuss the results of your test with you.  SEEK IMMEDIATE MEDICAL ATTENTION IF ANY OF THE FOLLOWING OCCUR:  Excessive nausea (feeling sick to your stomach) and/or vomiting.   Severe abdominal pain and distention (swelling).   Trouble swallowing.   Temperature over 101 F (37.8 C).   Rectal bleeding or vomiting of blood.   GERD information provided  Stop Protonix;  Start Dexilant 60 mg daily for 3 weeks. Please go by my office for free samples.  Let us  know how this medication is working for you. Try and lose some of the weight that you have gained  Office visit with [place] in 2 months    Gastroesophageal Reflux Disease, Adult Gastroesophageal reflux disease (GERD) happens when acid from your stomach flows up into the esophagus. When acid comes in contact with the esophagus, the acid causes soreness (inflammation) in the esophagus. Over time, GERD may create small holes (ulcers) in the lining of the esophagus. CAUSES   Increased body weight. This puts pressure on the stomach, making acid rise from the stomach into the esophagus.  Smoking. This increases acid production in the stomach.  Drinking alcohol. This causes decreased pressure in the lower esophageal sphincter (valve or ring of muscle between the esophagus and stomach), allowing acid from the stomach into the esophagus.  Late evening meals and a full stomach. This increases pressure and acid production in the stomach.  A malformed lower esophageal sphincter. Sometimes, no cause is found. SYMPTOMS   Burning pain in the lower part of the mid-chest behind the breastbone and in the mid-stomach area. This may occur twice a week or more often.  Trouble swallowing.  Sore throat.  Dry cough.  Asthma-like symptoms including chest tightness, shortness of breath, or wheezing. DIAGNOSIS  Your caregiver may be able to diagnose GERD based on your symptoms. In some cases, X-rays and other tests may be done to check for complications or to check the condition of your stomach and esophagus. TREATMENT  Your caregiver may recommend over-the-counter  or prescription medicines to help decrease acid production. Ask your caregiver before starting or adding any new medicines.  HOME CARE INSTRUCTIONS   Change the factors that you can control. Ask your caregiver for guidance concerning weight loss, quitting smoking, and alcohol consumption.  Avoid foods and drinks that make your symptoms worse,  such as:  Caffeine or alcoholic drinks.  Chocolate.  Peppermint or mint flavorings.  Garlic and onions.  Spicy foods.  Citrus fruits, such as oranges, lemons, or limes.  Tomato-based foods such as sauce, chili, salsa, and pizza.  Fried and fatty foods.  Avoid lying down for the 3 hours prior to your bedtime or prior to taking a nap.  Eat small, frequent meals instead of large meals.  Wear loose-fitting clothing. Do not wear anything tight around your waist that causes pressure on your stomach.  Raise the head of your bed 6 to 8 inches with wood blocks to help you sleep. Extra pillows will not help.  Only take over-the-counter or prescription medicines for pain, discomfort, or fever as directed by your caregiver.  Do not take aspirin, ibuprofen, or other nonsteroidal anti-inflammatory drugs (NSAIDs). SEEK IMMEDIATE MEDICAL CARE IF:   You have pain in your arms, neck, jaw, teeth, or back.  Your pain increases or changes in intensity or duration.  You develop nausea, vomiting, or sweating (diaphoresis).  You develop shortness of breath, or you faint.  Your vomit is green, yellow, black, or looks like coffee grounds or blood.  Your stool is red, bloody, or black. These symptoms could be signs of other problems, such as heart disease, gastric bleeding, or esophageal bleeding. MAKE SURE YOU:   Understand these instructions.  Will watch your condition.  Will get help right away if you are not doing well or get worse. Document Released: 09/28/2004 Document Revised: 03/13/2011 Document Reviewed: 07/08/2010 [facility] Patient Information 2015 [place], [place]. This information is not intended to replace advice given to you by your health care provider. Make sure you discuss any questions you have with your health care provider.

## 2014-03-24 NOTE — Telephone Encounter (Signed)
Per Manuela Schwartz, T/C from Endo to give pt Dexilant samples per Dr.Rourk. Samples given Dexilant 60 mg  #20  One daily.

## 2014-03-24 NOTE — H&P (View-Only) (Signed)
Referring Provider: Asencion Noble, MD Primary Care Physician:  Asencion Noble, MD  Primary GI: Dr. Gala Romney   Chief Complaint  Patient presents with  . Follow-up    HPI:   Eileen Green is a 64 y.o. female presenting today with a history of constipation, RLQ pain. Last seen Aug 2015 with some GERD symptoms, CT abd/pelvis completed due to persistent RLQ pain but unrevealing colonoscopy. CT normal.   Returns today stating she feels like sometimes everything she eats gives her a sour stomach. Feels "overfull". Early satiety. Feels like food is not digesting. No exercise. Will eat cereal for breakfast and full for hours thereafter. Sometimes difficulty just initiating a swallow in the morning with water but no esophageal dysphagia. Has tried Aciphex, Nexium, Protonix. Would like to stay on Protonix for now until after the EGD. Last EGD in 2005.   RLQ discomfort sometimes associated with gas. Taking Miralax prn. Taking supplemental fiber.   Past Medical History  Diagnosis Date  . GERD (gastroesophageal reflux disease)   . Hyperlipidemia   . Deaf     LEFT EAR  . Arthritis   . Cancer     skin melanoma under left eye    Past Surgical History  Procedure Laterality Date  . Tonsillectomy    . Abdominal hysterectomy  2001    Partial  . Esophagogastroduodenoscopy  01/21/2003    RMR:  Normal esophagus, stomach, D1 and D2  . Colonoscopy  01/21/2003    KXF:[id number] rectum/ Long capacious colon, moderate prep made the exam more challenging.  . Colonoscopy  05/18/2009    Dr. Gala Romney: Normal rectum/narrow mouth left sided diverticula   . Colonoscopy N/A 05/14/2013    Dr. Gala Romney: hemorrhoids, colonic diverticula, redudant colon    Current Outpatient Prescriptions  Medication Sig Dispense Refill  . hydrochlorothiazide (MICROZIDE) 12.5 MG capsule Take 12.5 mg by mouth daily.     . Lactobacillus (DIGESTIVE HEALTH PROBIOTIC PO) Take 1 tablet by mouth daily.     Marland Kitchen lisinopril (PRINIVIL,ZESTRIL) 10 MG tablet  Take 10 mg by mouth daily.    . Misc Natural Products (OSTEO BI-FLEX TRIPLE STRENGTH PO) Take by mouth daily.      . Multiple Vitamin (MULTIVITAMIN) capsule Take 1 capsule by mouth at bedtime.     . pantoprazole (PROTONIX) 40 MG tablet take 1 tablet by mouth every morning 30 MINUTES PRIOR TO BREAKFAST 30 tablet 3  . polyethylene glycol (MIRALAX / GLYCOLAX) packet Take 17 g by mouth daily.    Marland Kitchen PRAVASTATIN SODIUM PO Take 10 mg by mouth daily.     Marland Kitchen SYNTHROID 50 MCG tablet Take 1 tablet (50 mcg total) by mouth daily. 30 tablet 11  . Wheat Dextrin (BENEFIBER PO) Take by mouth 2 (two) times daily.     No current facility-administered medications for this visit.    Allergies as of 03/18/2014  . (No Known Allergies)    Family History  Problem Relation Age of Onset  . Heart disease Mother   . Heart disease Father   . Kidney disease Father   . Thyroid nodules Father   . Dementia Mother   . Colon cancer Neg Hx     History   Social History  . Marital Status: Married    Spouse Name: N/A  . Number of Children: N/A  . Years of Education: N/A   Occupational History  . retired     still teaches at [employer], Brewing technologist in [place]  Topics  . Smoking status: Never Smoker   . Smokeless tobacco: Never Used     Comment: Never smoked  . Alcohol Use: No  . Drug Use: No  . Sexual Activity: Not on file   Other Topics Concern  . None   Social History Narrative    Review of Systems: Negative unless mentioned in HPI  Physical Exam: BP 115/74 mmHg  Pulse 82  Temp(Src) 98.2 F (36.8 C) (Oral)  Ht 5\' 5"  (1.651 m)  Wt 176 lb 6.4 oz (80.015 kg)  BMI 29.35 kg/m2 General:   Alert and oriented. No distress noted. Pleasant and cooperative.  Head:  Normocephalic and atraumatic. Eyes:  Conjuctiva clear without scleral icterus. Mouth:  Oral mucosa pink and moist. Good dentition. No lesions. Heart:  S1, S2 present without murmurs, rubs, or gallops. Regular rate and  rhythm. Abdomen:  +BS, soft, non-tender and non-distended. No rebound or guarding. No HSM or masses noted. Msk:  Symmetrical without gross deformities. Normal posture. Extremities:  Without edema. Neurologic:  Alert and  oriented x4;  grossly normal neurologically. Skin:  Intact without significant lesions or rashes. Psych:  Alert and cooperative. Normal mood and affect.

## 2014-03-24 NOTE — Progress Notes (Signed)
cc'ed to pcp °

## 2014-03-26 ENCOUNTER — Telehealth: Payer: Self-pay | Admitting: Internal Medicine

## 2014-03-26 NOTE — Telephone Encounter (Signed)
I spoke with the pt. She said she started taking the dexilant and has not noticed any difference, Still having reflux symptoms. I advised her that it would take at least 7-10 days for the medication to become effective and she needed to follow the reflux information that she was given after her procedure. She said she is trying and she has only had a couple of episodes where she had miserable heartburn. She is using tums and said they werent helping at all and said she wanted to try zantac in addition to the dexilant , advised her to make sure she didn't take them both at the same time. She said she wouldn't. She is going to give the dexilant till next week and she will call and let me know how she is doing.

## 2014-03-26 NOTE — Telephone Encounter (Signed)
PLEASE CALL PATIENT REGARDING HER PROCEDURE SHE HAD DONE

## 2014-04-03 ENCOUNTER — Encounter (HOSPITAL_COMMUNITY): Payer: Self-pay | Admitting: Internal Medicine

## 2014-04-09 ENCOUNTER — Telehealth: Payer: Self-pay | Admitting: Internal Medicine

## 2014-04-09 MED ORDER — DEXLANSOPRAZOLE 60 MG PO CPDR: 60.0000 mg | DELAYED_RELEASE_CAPSULE | Freq: Every day | ORAL | Status: DC

## 2014-04-09 NOTE — Telephone Encounter (Signed)
Routing to the refill box. 

## 2014-04-09 NOTE — Telephone Encounter (Signed)
Completed.

## 2014-04-09 NOTE — Telephone Encounter (Signed)
Pt called to say she will be running out of Dexilant samples soon and needs a prescription called into [facility] on [address]

## 2014-04-11 ENCOUNTER — Other Ambulatory Visit: Payer: Self-pay | Admitting: Nurse Practitioner

## 2014-04-14 NOTE — Op Note (Signed)
[facility] [address] [place], [address]   ENDOSCOPY PROCEDURE REPORT  PATIENT: Eileen Green, Eileen Green  MR#: [id number] BIRTHDATE: 03-26-1950 , 63  yrs. old GENDER: female ENDOSCOPIST: R.  Garfield Cornea, MD FACP FACG REFERRED BY:  Asencion Noble, M.D. PROCEDURE DATE:  03/24/2014 PROCEDURE:  EGD, diagnostic INDICATIONS:  dyspepsia. MEDICATIONS: Versed 5 mg IV and Demerol 75 mg IV in divided doses. Xylocaine gel orally.  Zofran 4 mg IV. ASA CLASS:      Class II  CONSENT: The risks, benefits, limitations, alternatives and imponderables have been discussed.  The potential for biopsy, esophogeal dilation, etc. have also been reviewed.  Questions have been answered.  All parties agreeable.  Please see the history and physical in the medical record for more information.  DESCRIPTION OF PROCEDURE: After the risks benefits and alternatives of the procedure were thoroughly explained, informed consent was obtained.  The [id number] ([id number]) endoscope was introduced through the mouth and advanced to the second portion of the duodenum , limited by Without limitations. The instrument was slowly withdrawn as the mucosa was fully examined.    Normal-appearing tubular esophagus aside from 2 inlet patches at the level of the upper esophageal sphincter.  Stomach empty.  3 cm hiatal hernia.  Gastric mucosa appeared normal.  Patent pylorus. Normal first and second portion of the duodenum.  Retroflexed views revealed a hiatal hernia.     The scope was then withdrawn from the patient and the procedure completed.  COMPLICATIONS: There were no immediate complications.  ENDOSCOPIC IMPRESSION: Hiatal hernia; otherwise negative EGD  RECOMMENDATIONS: Stop Protonix; begin Dexilant 60 mg daily.     Office visit to be scheduled.  REPEAT EXAM:  eSigned:  R. Garfield Cornea, MD [facility]al[facility]ss[facility] 05-03-2014 12:09 PM    CC:  CPT CODES: ICD CODES:  The ICD and CPT codes recommended by this software  are interpretations from the data that the clinical staff has captured with the software.  The verification of the translation of this report to the ICD and CPT codes and modifiers is the sole responsibility of the health care institution and practicing physician where this report was generated.  [employer] will not be held responsible for the validity of the ICD and CPT codes included on this report.  AMA assumes no liability for data contained or not contained herein. CPT is a Designer, television/film set of the [employer].  PATIENT NAME:  Eileen Green, Eileen Green MR#: [id number]

## 2014-05-25 ENCOUNTER — Ambulatory Visit: Payer: Federal, State, Local not specified - PPO | Admitting: Gastroenterology

## 2014-05-26 ENCOUNTER — Ambulatory Visit (INDEPENDENT_AMBULATORY_CARE_PROVIDER_SITE_OTHER): Payer: Federal, State, Local not specified - PPO | Admitting: Gastroenterology

## 2014-05-26 ENCOUNTER — Encounter: Payer: Self-pay | Admitting: Gastroenterology

## 2014-05-26 VITALS — BP 124/72 | HR 70 | Temp 97.3°F | Ht 65.0 in | Wt 175.2 lb

## 2014-05-26 DIAGNOSIS — R1013 Epigastric pain: Secondary | ICD-10-CM | POA: Diagnosis not present

## 2014-05-26 DIAGNOSIS — K5909 Other constipation: Secondary | ICD-10-CM

## 2014-05-26 NOTE — Patient Instructions (Signed)
Continue Protonix once each morning. You may add Pepcid or Zantac in the evening as needed.  As we discussed, focus on good, healthy eating habits and exercise most days of the week. Keep a food/symptom journal.   I will see you in 6 months! Have a great summer!

## 2014-05-26 NOTE — Progress Notes (Signed)
Referring Provider: Asencion Noble, MD Primary Care Physician:  Asencion Noble, MD  Primary GI: Dr. Gala Romney   Chief Complaint  Patient presents with  . Follow-up    HPI:   Eileen Green is a 64 y.o. female presenting today with a history of constipation, RLQ discomfort, GERD, early satiety. Recent EGD due to early satiety, dyspepsia. EGD overall normal. Has had CT recently that was normal. Colonoscopy up-to-date.   Can't tell difference with Protonix or Dexilant. Taking Protonix now. Has an OTC acid reducer (H2 blocker), taking as needed. As she creeps up towards another meal, feels like she has "lint" in her esophagus. Seems better after eating. Some mild nausea. Regardless of amount she eats, feels like food is expanded and not digesting. No unexplained weight loss. Would like to focus on diet and exercise. Does not want to pursue GES just yet. Wonders if diet playing a role.    Past Medical History  Diagnosis Date  . GERD (gastroesophageal reflux disease)   . Hyperlipidemia Deaf in Left ear  . Deaf     LEFT EAR  . Arthritis   . Cancer     skin melanoma under left eye    Past Surgical History  Procedure Laterality Date  . Tonsillectomy    . Abdominal hysterectomy  2001    Partial  . Esophagogastroduodenoscopy  01/21/2003    RMR:  Normal esophagus, stomach, D1 and D2  . Colonoscopy  01/21/2003    [id number] rectum/ Long capacious colon, moderate prep made the exam more challenging.  . Colonoscopy  05/18/2009    Dr. Gala Romney: Normal rectum/narrow mouth left sided diverticula   . Colonoscopy N/A 05/14/2013    Dr. Gala Romney: hemorrhoids, colonic diverticula, redudant colon  . Esophagogastroduodenoscopy N/A 03/24/2014    Dr. Gala Romney :Hiatal hernia; otherwise negative EGD    Current Outpatient Prescriptions  Medication Sig Dispense Refill  . hydrochlorothiazide (MICROZIDE) 12.5 MG capsule Take 12.5 mg by mouth daily.     . Lactobacillus (DIGESTIVE HEALTH PROBIOTIC PO) Take 1 tablet by mouth  daily.     Marland Kitchen lisinopril (PRINIVIL,ZESTRIL) 10 MG tablet Take 10 mg by mouth daily.    . Misc Natural Products (OSTEO BI-FLEX TRIPLE STRENGTH PO) Take by mouth daily.      . Multiple Vitamin (MULTIVITAMIN) capsule Take 1 capsule by mouth at bedtime.     . pantoprazole (PROTONIX) 40 MG tablet take 1 tablet by mouth every morning 30 MINUTES PRIOR TO BREAKFAST 30 tablet 5  . polyethylene glycol (MIRALAX / GLYCOLAX) packet Take 17 g by mouth daily.    Marland Kitchen PRAVASTATIN SODIUM PO Take 10 mg by mouth daily.     Marland Kitchen SYNTHROID 50 MCG tablet Take 1 tablet (50 mcg total) by mouth daily. 30 tablet 11  . Wheat Dextrin (BENEFIBER PO) Take by mouth 2 (two) times daily.    Marland Kitchen dexlansoprazole (DEXILANT) 60 MG capsule Take 1 capsule (60 mg total) by mouth daily. (Patient not taking: Reported on 05/26/2014) 90 capsule 3   No current facility-administered medications for this visit.    Allergies as of 05/26/2014  . (No Known Allergies)    Family History  Problem Relation Age of Onset  . Heart disease Mother   . Heart disease Father   . Kidney disease Father   . Thyroid nodules Father   . Dementia Mother   . Colon cancer Neg Hx     History   Social History  . Marital Status: Married  Spouse Name: N/A  . Number of Children: N/A  . Years of Education: N/A   Occupational History  . retired     still teaches at [employer], Brewing technologist in [place]   Social History Main Topics  . Smoking status: Never Smoker   . Smokeless tobacco: Never Used     Comment: Never smoked  . Alcohol Use: No  . Drug Use: No  . Sexual Activity: Not on file   Other Topics Concern  . None   Social History Narrative    Review of Systems: As mentioned in HPI.   Physical Exam: BP 124/72 mmHg  Pulse 70  Temp(Src) 97.3 F (36.3 C)  Ht 5\' 5"  (1.651 m)  Wt 175 lb 3.2 oz (79.47 kg)  BMI 29.15 kg/m2 General:   Alert and oriented. No distress noted. Pleasant and cooperative.  Head:  Normocephalic and atraumatic. Eyes:   Conjuctiva clear without scleral icterus. Mouth:  Oral mucosa pink and moist. Good dentition. No lesions. Abdomen:  +BS, soft, non-tender and non-distended. No rebound or guarding. No HSM or masses noted. Msk:  Symmetrical without gross deformities. Normal posture. Extremities:  Without edema. Neurologic:  Alert and  oriented x4;  grossly normal neurologically. Psych:  Alert and cooperative. Normal mood and affect.

## 2014-05-28 ENCOUNTER — Encounter: Payer: Self-pay | Admitting: Gastroenterology

## 2014-05-28 NOTE — Assessment & Plan Note (Signed)
64 year old female with early satiety, nausea, vague GERD symptoms s/p EGD that showed no abnormalities. No alarm features, weight is stable, and CT is on file. Highly suspect diet/behavior modification may improve overall symptoms. No improvement with Dexilant, so she has requested to stay on Protonix. Consider GES if no improvement; this was offered to her, but she would like to hold off right now. Return in 6 months.

## 2014-05-28 NOTE — Assessment & Plan Note (Signed)
With intermittent RLQ discomfort but relieved with defecation. Colonoscopy up-to-date. Continue high fiber, fiber supplementation.

## 2014-05-28 NOTE — Progress Notes (Signed)
CC'ED TO PCP 

## 2014-10-15 ENCOUNTER — Encounter: Payer: Self-pay | Admitting: Internal Medicine

## 2014-11-29 ENCOUNTER — Other Ambulatory Visit: Payer: Self-pay | Admitting: Gastroenterology

## 2015-12-02 ENCOUNTER — Other Ambulatory Visit: Payer: Self-pay | Admitting: Nurse Practitioner

## 2016-01-13 ENCOUNTER — Encounter: Payer: Self-pay | Admitting: Internal Medicine

## 2016-01-13 ENCOUNTER — Ambulatory Visit (INDEPENDENT_AMBULATORY_CARE_PROVIDER_SITE_OTHER): Payer: Federal, State, Local not specified - PPO | Admitting: Internal Medicine

## 2016-01-13 VITALS — BP 142/76 | HR 74 | Ht 63.5 in | Wt 177.2 lb

## 2016-01-13 DIAGNOSIS — E785 Hyperlipidemia, unspecified: Secondary | ICD-10-CM | POA: Diagnosis not present

## 2016-01-13 DIAGNOSIS — R011 Cardiac murmur, unspecified: Secondary | ICD-10-CM | POA: Diagnosis not present

## 2016-01-13 DIAGNOSIS — I10 Essential (primary) hypertension: Secondary | ICD-10-CM

## 2016-01-13 DIAGNOSIS — R079 Chest pain, unspecified: Secondary | ICD-10-CM | POA: Diagnosis not present

## 2016-01-13 MED ORDER — PRAVASTATIN SODIUM 20 MG PO TABS: 20.0000 mg | ORAL_TABLET | Freq: Every day | ORAL | 5 refills | Status: DC

## 2016-01-13 NOTE — Progress Notes (Signed)
OFFICE NOTE  Chief Complaint:  Chest pain  Primary Care Physician: Asencion Noble, MD  HPI:  Eileen Green is a 66 y.o. female patient referred by Dr. Willey Blade for evaluation of chest pain. She reports she has an episode of chest pain about every month. It might be related to stress. 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 She does have a family history of heart disease with her mother who had an MI in her 61s and her father who had chronic kidney disease and was on dialysis. She denies any worsening shortness of breath or increased fatigue or decreased exercise tolerance. She does have additional risk factors including hypertension and dyslipidemia which are well controlled. Her recent LDL cholesterol was 108 however she is on a low-dose of a low potency statin. She does not have a history of any side effects on that.  PMHx:  Past Medical History:  Diagnosis Date  . Arthritis   . Cancer ([place])    skin melanoma under left eye  . Deaf    LEFT EAR  . GERD (gastroesophageal reflux disease)   . Hyperlipidemia Deaf in Left ear    Past Surgical History:  Procedure Laterality Date  . ABDOMINAL HYSTERECTOMY  2001   Partial  . COLONOSCOPY  01/21/2003   [id number] rectum/ Long capacious colon, moderate prep made the exam more challenging.  Marland Kitchen COLONOSCOPY  05/18/2009   Dr. Gala Romney: Normal rectum/narrow mouth left sided diverticula   . COLONOSCOPY N/A 05/14/2013   Dr. Gala Romney: hemorrhoids, colonic diverticula, redudant colon  . ESOPHAGOGASTRODUODENOSCOPY  01/21/2003   RMR:  Normal esophagus, stomach, D1 and D2  . ESOPHAGOGASTRODUODENOSCOPY N/A 03/24/2014   Dr. Gala Romney :Hiatal hernia; otherwise negative EGD  . TONSILLECTOMY      FAMHx:  Family History  Problem Relation Age of Onset  . Heart disease Mother   . Heart disease Father   . Kidney disease Father   . Thyroid nodules Father   . Dementia Mother   . Colon cancer Neg Hx     SOCHx:   reports that  she has never smoked. She has never used smokeless tobacco. She reports that she does not drink alcohol or use drugs.  ALLERGIES:  No Known Allergies  ROS: Pertinent items noted in HPI and remainder of comprehensive ROS otherwise negative.  HOME MEDS: Current Outpatient Prescriptions on File Prior to Visit  Medication Sig Dispense Refill  . hydrochlorothiazide (MICROZIDE) 12.5 MG capsule Take 12.5 mg by mouth daily.     . Lactobacillus (DIGESTIVE HEALTH PROBIOTIC PO) Take 1 tablet by mouth daily.     Marland Kitchen lisinopril (PRINIVIL,ZESTRIL) 10 MG tablet Take 10 mg by mouth daily.    . Misc Natural Products (OSTEO BI-FLEX TRIPLE STRENGTH PO) Take by mouth daily.      . Multiple Vitamin (MULTIVITAMIN) capsule Take 1 capsule by mouth at bedtime.     . pantoprazole (PROTONIX) 40 MG tablet take 1 tablet by mouth every morning 90 tablet 3  . polyethylene glycol (MIRALAX / GLYCOLAX) packet Take 17 g by mouth daily.    Marland Kitchen PRAVASTATIN SODIUM PO Take 10 mg by mouth daily.     Marland Kitchen SYNTHROID 50 MCG tablet Take 1 tablet (50 mcg total) by mouth daily. 30 tablet 11   No current facility-administered medications on file prior to visit.     LABS/IMAGING: No results found for this or any previous visit (from the past 48 hour(s)). No results  found.  WEIGHTS: Wt Readings from Last 3 Encounters:  01/13/16 177 lb 3.2 oz (80.4 kg)  05/26/14 175 lb 3.2 oz (79.5 kg)  03/24/14 175 lb (79.4 kg)    VITALS: BP (!) 142/76   Pulse 74   Ht 5' 3.5" (1.613 m)   Wt 177 lb 3.2 oz (80.4 kg)   BMI 30.90 kg/m   EXAM: General appearance: alert and no distress Neck: no carotid bruit and no JVD Lungs: clear to auscultation bilaterally Heart: regular rate and rhythm, S1, S2 normal and systolic murmur: early systolic 2/6, blowing at 2nd right intercostal space Abdomen: soft, non-tender; bowel sounds normal; no masses,  no organomegaly Extremities: extremities normal, atraumatic, no cyanosis or edema Pulses: 2+ and  symmetric Skin: Skin color, texture, turgor normal. No rashes or lesions Neurologic: Grossly normal Psych: Pleasant  EKG: Normal sinus rhythm at 74  ASSESSMENT: 1. Intermittent chest pain 2. Hypertension-controlled 3. Dyslipidemia 4. Family history of coronary disease 5. Murmur  PLAN: 1.   [name] has intermittent chest pain which occurs almost on a monthly basis, but has no significant alleviating or exacerbating factors and no pattern of necessarily worsening or improving. Based on her family history of heart disease she's concerned about her symptoms. She also has a soft heart murmur which was noted on exam today. I would recommend a stress echocardiogram to further evaluate for coronary ischemia. If this is negative, then I would continue to optimize medical therapy. With her recent LDL of 108, she could stand have better cholesterol control. A goal LDL less than 100 would be ideal and closer to 70 has been demonstrated in studies to reduce her risk. I advised her to increase pravastatin to 20 mg daily and we will recheck her lipid profile in 3 months.  Thanks for the kind referral. Follow-up with me afterwards.  Pixie Casino, MD, [facility] Attending Cardiologist [facility] 01/13/2016, 4:07 PM

## 2016-01-13 NOTE — Patient Instructions (Addendum)
Your physician has requested that you have a stress echocardiogram @ [address]. Echocardiography is a painless test that uses sound waves to create images of your heart. It provides your doctor with information about the size and shape of your heart and how well your heart's chambers and valves are working. This procedure takes approximately one hour. There are no restrictions for this procedure.  Your physician has recommended you make the following change in your medication: -- INCREASE pravastatin to 20mg  daily  Your physician recommends that you return for lab work in: [place] (fasting)  Your physician recommends that you schedule a follow-up appointment after your test

## 2016-01-27 ENCOUNTER — Telehealth (HOSPITAL_COMMUNITY): Payer: Self-pay | Admitting: *Deleted

## 2016-01-27 NOTE — Telephone Encounter (Signed)
Left message on voicemail in reference to upcoming appointment scheduled for 02/02/16. Phone number given for a call back so details instructions can be given. Sharon S Brooks ° ° °

## 2016-02-01 ENCOUNTER — Telehealth (HOSPITAL_COMMUNITY): Payer: Self-pay | Admitting: *Deleted

## 2016-02-01 NOTE — Telephone Encounter (Signed)
Patient given detailed instructions per Stress Test Requisition Sheet for test on 02/02/16 at 2:30.Patient Notified to arrive 30 minutes early, and that it is imperative to arrive on time for appointment to keep from having the test rescheduled.  Patient verbalized understanding. Veronia Beets

## 2016-02-02 ENCOUNTER — Encounter (INDEPENDENT_AMBULATORY_CARE_PROVIDER_SITE_OTHER): Payer: Self-pay

## 2016-02-02 ENCOUNTER — Ambulatory Visit (HOSPITAL_COMMUNITY): Payer: Federal, State, Local not specified - PPO | Attending: Cardiovascular Disease

## 2016-02-02 DIAGNOSIS — R079 Chest pain, unspecified: Secondary | ICD-10-CM

## 2016-02-02 DIAGNOSIS — R011 Cardiac murmur, unspecified: Secondary | ICD-10-CM

## 2016-02-08 ENCOUNTER — Ambulatory Visit (INDEPENDENT_AMBULATORY_CARE_PROVIDER_SITE_OTHER): Payer: Federal, State, Local not specified - PPO | Admitting: Internal Medicine

## 2016-02-08 ENCOUNTER — Encounter: Payer: Self-pay | Admitting: Internal Medicine

## 2016-02-08 VITALS — BP 124/72 | HR 98 | Ht 64.5 in

## 2016-02-08 DIAGNOSIS — I10 Essential (primary) hypertension: Secondary | ICD-10-CM | POA: Diagnosis not present

## 2016-02-08 DIAGNOSIS — E785 Hyperlipidemia, unspecified: Secondary | ICD-10-CM | POA: Diagnosis not present

## 2016-02-08 DIAGNOSIS — R011 Cardiac murmur, unspecified: Secondary | ICD-10-CM

## 2016-02-08 DIAGNOSIS — R079 Chest pain, unspecified: Secondary | ICD-10-CM | POA: Diagnosis not present

## 2016-02-08 NOTE — Progress Notes (Signed)
OFFICE NOTE  Chief Complaint:  Follow-up stress test  Primary Care Physician: Asencion Noble, MD  HPI:  Eileen Green is a 66 y.o. female patient referred by Dr. Willey Blade for evaluation of chest pain. She reports she has an episode of chest pain about every month. It might be related to stress. 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 She does have a family history of heart disease with her mother who had an MI in her 37s and her father who had chronic kidney disease and was on dialysis. She denies any worsening shortness of breath or increased fatigue or decreased exercise tolerance. She does have additional risk factors including hypertension and dyslipidemia which are well controlled. Her recent LDL cholesterol was 108 however she is on a low-dose of a low potency statin. She does not have a history of any side effects on that.  02/08/2016  Eileen Green underwent a stress echocardiogram which was negative for ischemia. She had good exercise tolerance despite having had an upper respiratory infection about a week prior to that. She says she was able to tolerate exercise without any significant shortness of breath which is a good sign. Oxygen saturation today however was only 95% and she says she's recently had some cough. Blood pressure is at goal. I felt like her chest pain is noncardiac and seem somewhat atypical. She does have cardio Gerald Stabs factors and a family history of premature coronary disease and I agree with aggressive primary prevention.   PMHx:  Past Medical History:  Diagnosis Date  . Arthritis   . Cancer ([place])    skin melanoma under left eye  . Deaf    LEFT EAR  . GERD (gastroesophageal reflux disease)   . Hyperlipidemia Deaf in Left ear    Past Surgical History:  Procedure Laterality Date  . ABDOMINAL HYSTERECTOMY  2001   Partial  . COLONOSCOPY  01/21/2003   [id number] rectum/ Long capacious colon, moderate prep made the exam more  challenging.  Marland Kitchen COLONOSCOPY  05/18/2009   Dr. Gala Romney: Normal rectum/narrow mouth left sided diverticula   . COLONOSCOPY N/A 05/14/2013   Dr. Gala Romney: hemorrhoids, colonic diverticula, redudant colon  . ESOPHAGOGASTRODUODENOSCOPY  01/21/2003   RMR:  Normal esophagus, stomach, D1 and D2  . ESOPHAGOGASTRODUODENOSCOPY N/A 03/24/2014   Dr. Gala Romney :Hiatal hernia; otherwise negative EGD  . TONSILLECTOMY      FAMHx:  Family History  Problem Relation Age of Onset  . Heart disease Mother   . Dementia Mother   . Heart disease Father   . Kidney disease Father   . Thyroid nodules Father   . Colon cancer Neg Hx     SOCHx:   reports that she has never smoked. She has never used smokeless tobacco. She reports that she does not drink alcohol or use drugs.  ALLERGIES:  No Known Allergies  ROS: Pertinent items noted in HPI and remainder of comprehensive ROS otherwise negative.  HOME MEDS: Current Outpatient Prescriptions on File Prior to Visit  Medication Sig Dispense Refill  . hydrochlorothiazide (MICROZIDE) 12.5 MG capsule Take 12.5 mg by mouth daily.     . Lactobacillus (DIGESTIVE HEALTH PROBIOTIC PO) Take 1 tablet by mouth daily.     Marland Kitchen lisinopril (PRINIVIL,ZESTRIL) 10 MG tablet Take 10 mg by mouth daily.    . Methylcellulose, Laxative, 500 MG TABS Take 1 tablet by mouth 2 (two) times daily.    . Misc Natural Products (OSTEO BI-FLEX  TRIPLE STRENGTH PO) Take by mouth daily.      . Multiple Vitamin (MULTIVITAMIN) capsule Take 1 capsule by mouth at bedtime.     . pantoprazole (PROTONIX) 40 MG tablet take 1 tablet by mouth every morning 90 tablet 3  . polyethylene glycol (MIRALAX / GLYCOLAX) packet Take 17 g by mouth daily.    . pravastatin (PRAVACHOL) 20 MG tablet Take 1 tablet (20 mg total) by mouth daily. 30 tablet 5  . SYNTHROID 50 MCG tablet Take 1 tablet (50 mcg total) by mouth daily. 30 tablet 11   No current facility-administered medications on file prior to visit.      LABS/IMAGING: No results found for this or any previous visit (from the past 48 hour(s)). No results found.  WEIGHTS: Wt Readings from Last 3 Encounters:  01/13/16 177 lb 3.2 oz (80.4 kg)  05/26/14 175 lb 3.2 oz (79.5 kg)  03/24/14 175 lb (79.4 kg)    VITALS: BP 124/72 (BP Location: Right Arm, Patient Position: Sitting, Cuff Size: Normal)   Pulse 98   Ht 5' 4.5" (1.638 m)   SpO2 95%   EXAM: Deferred  EKG: Deferred  ASSESSMENT: 1. Intermittent chest pain - negative stress echo (2018) 2. Hypertension-controlled 3. Dyslipidemia 4. Family history of coronary disease 5. Murmur  PLAN: 1.   Eileen Green is describing atypical chest pain but does have a family history of heart disease and a number of cardiac risk factors. Her stress echo was negative which is reassuring and she is no longer having chest pain symptoms. I've advised continued aggressive risk factor modification and would be happy see her back on an as-needed basis.  Pixie Casino, MD, [facility] Attending Cardiologist [place] 02/08/2016, 5:32 PM

## 2016-02-08 NOTE — Patient Instructions (Signed)
Your physician recommends that you schedule a follow-up appointment as needed  

## 2016-02-09 ENCOUNTER — Ambulatory Visit (INDEPENDENT_AMBULATORY_CARE_PROVIDER_SITE_OTHER): Payer: Self-pay

## 2016-02-09 ENCOUNTER — Encounter (INDEPENDENT_AMBULATORY_CARE_PROVIDER_SITE_OTHER): Payer: Self-pay | Admitting: Orthopaedic Surgery

## 2016-02-09 ENCOUNTER — Ambulatory Visit (INDEPENDENT_AMBULATORY_CARE_PROVIDER_SITE_OTHER): Payer: Federal, State, Local not specified - PPO

## 2016-02-09 ENCOUNTER — Ambulatory Visit (INDEPENDENT_AMBULATORY_CARE_PROVIDER_SITE_OTHER): Payer: Federal, State, Local not specified - PPO | Admitting: Orthopaedic Surgery

## 2016-02-09 VITALS — BP 128/68 | HR 86 | Ht 64.0 in | Wt 178.0 lb

## 2016-02-09 DIAGNOSIS — G8929 Other chronic pain: Secondary | ICD-10-CM | POA: Diagnosis not present

## 2016-02-09 DIAGNOSIS — M25511 Pain in right shoulder: Secondary | ICD-10-CM

## 2016-02-09 NOTE — Progress Notes (Signed)
Office Visit Note   Patient: Eileen Green           Date of Birth: 1950/08/07           MRN: [id number] Visit Date: 02/09/2016              Requested by: Asencion Noble, MD [address] [place], [place] [address] PCP: Asencion Noble, MD   Assessment & Plan: Visit Diagnoses: Bilateral shoulder pain right greater than left. Probably some very minimal arthritis possibility of soft tissue abnormality.   Plan long discussion regarding different treatment options. Eileen Green would like to try NSAIDs,, Pennsaid, and a short course of physical therapy. If no improvement over the next 3-4 weeks I would suggest consideration of a cortisone injection of the right side as it is the more symptomatic and then possibly an MRI scan.:   Follow-Up Instructions: No Follow-up on file.   Orders:  No orders of the defined types were placed in this encounter.  No orders of the defined types were placed in this encounter.     Procedures: No procedures performed   Clinical Data: No additional findings.   Subjective: Chief Complaint  Patient presents with  . Right Shoulder - Pain    Patient presents to clinic today for right shoulder pain for about a month states it pops and she has pain reching behind her and overhead. She has tried aleve with no relief.   Eileen Green relates insidious onset of bilateral shoulder pain right greater than left over the last 4-6 weeks. She thinks the has had a real impact in her pain. She's had some difficulty raising her arm over her head particularly on the right side. She denies history of injury or trauma. Review of Systems   Objective: Vital Signs: There were no vitals taken for this visit.  Physical Exam  Ortho Exam negative pain with range of motion of the cervical spine in flexion extension or rotation.  Mild impingement and mild empty can testing along the anterior aspect of the shoulder. Negative speed sign. Skin intact. No localized areas of  tenderness. Loss of overhead motion but with a minimally painful arc of motion.  Specialty Comments:  No specialty comments available.  Imaging: No results found.   PMFS History: Patient Active Problem List   Diagnosis Date Noted  . Dyslipidemia 01/13/2016  . Murmur 01/13/2016  . Essential hypertension 01/13/2016  . Hiatal hernia   . Dyspepsia 03/18/2014  . RLQ abdominal pain 08/08/2013  . 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 HYPOTHYROIDISM 11/05/2008  . CONSTIPATION, CHRONIC 11/04/2008  . GERD 06/18/2008  . CHEST PAIN 06/18/2008   Past Medical History:  Diagnosis Date  . Arthritis   . Cancer ([place])    skin melanoma under left eye  . Deaf    LEFT EAR  . GERD (gastroesophageal reflux disease)   . Hyperlipidemia Deaf in Left ear    Family History  Problem Relation Age of Onset  . Heart disease Mother   . Dementia Mother   . Heart disease Father   . Kidney disease Father   . Thyroid nodules Father   . Colon cancer Neg Hx     Past Surgical History:  Procedure Laterality Date  . ABDOMINAL HYSTERECTOMY  2001   Partial  . COLONOSCOPY  01/21/2003   [id number] rectum/ Long capacious colon, moderate prep made the exam more challenging.  Marland Kitchen COLONOSCOPY  05/18/2009   Dr. Gala Romney: Normal rectum/narrow mouth left sided diverticula   .  COLONOSCOPY N/A 05/14/2013   Dr. Gala Romney: hemorrhoids, colonic diverticula, redudant colon  . ESOPHAGOGASTRODUODENOSCOPY  01/21/2003   RMR:  Normal esophagus, stomach, D1 and D2  . ESOPHAGOGASTRODUODENOSCOPY N/A 03/24/2014   Dr. Gala Romney :Hiatal hernia; otherwise negative EGD  . TONSILLECTOMY     Social History   Occupational History  . retired     still teaches at [employer], Brewing technologist in [place]   Social History Main Topics  . Smoking status: Never Smoker  . Smokeless tobacco: Never Used     Comment: Never smoked  . Alcohol use No  . Drug use: No  . Sexual activity: Not on file

## 2016-04-20 LAB — LIPID PANEL
Cholesterol: 169 mg/dL
HDL: 51 mg/dL
LDL Cholesterol: 104 mg/dL — ABNORMAL HIGH
Total CHOL/HDL Ratio: 3.3 ratio
Triglycerides: 71 mg/dL
VLDL: 14 mg/dL

## 2016-04-25 ENCOUNTER — Encounter: Payer: Self-pay | Admitting: Internal Medicine

## 2016-04-25 ENCOUNTER — Telehealth: Payer: Self-pay | Admitting: Internal Medicine

## 2016-04-25 DIAGNOSIS — E785 Hyperlipidemia, unspecified: Secondary | ICD-10-CM

## 2016-04-25 MED ORDER — PRAVASTATIN SODIUM 40 MG PO TABS: 40.0000 mg | ORAL_TABLET | Freq: Every day | ORAL | 3 refills | Status: DC

## 2016-04-25 NOTE — Telephone Encounter (Signed)
Pt returning your call,concerning her lab results.

## 2016-04-25 NOTE — Telephone Encounter (Signed)
Notes recorded by Pixie Casino, MD on 04/24/2016 at 4:07 PM EDT Cholesterol remains above goal of <100 - she has a calculated 10-year pooled cohort risk of 5.3%. Moderate intensity statin therapy is recommended. Would increase pravastatin to 40 mg daily. Recheck lipid profile in 2 months.  Dr. Lemmie Evens  Patient aware of results. Agrees w/MD plan. Rx(s) sent to pharmacy electronically & lab ordered. Lab slip and copy of results mailed to patient

## 2016-07-27 LAB — LIPID PANEL
Cholesterol: 159 mg/dL (ref <200)
HDL: 48 mg/dL — ABNORMAL LOW (ref >50)
LDL Cholesterol: 91 mg/dL (ref <100)
Total CHOL/HDL Ratio: 3.3 Ratio (ref <5.0)
Triglycerides: 99 mg/dL (ref <150)
VLDL: 20 mg/dL (ref <30)

## 2016-07-28 ENCOUNTER — Encounter: Payer: Self-pay | Admitting: Internal Medicine

## 2016-11-28 ENCOUNTER — Other Ambulatory Visit: Payer: Self-pay

## 2016-11-30 MED ORDER — PANTOPRAZOLE SODIUM 40 MG PO TBEC: 40.0000 mg | DELAYED_RELEASE_TABLET | Freq: Every morning | ORAL | 0 refills | Status: DC

## 2016-11-30 NOTE — Telephone Encounter (Signed)
Refill X 1. Needs follow up OV. Last seen in 2016.

## 2016-11-30 NOTE — Telephone Encounter (Signed)
Letter mailed for pt to call for appt.

## 2016-12-18 ENCOUNTER — Ambulatory Visit: Payer: Federal, State, Local not specified - PPO | Admitting: Gastroenterology

## 2016-12-29 ENCOUNTER — Encounter: Payer: Self-pay | Admitting: Gastroenterology

## 2016-12-29 ENCOUNTER — Ambulatory Visit: Payer: Federal, State, Local not specified - PPO | Admitting: Gastroenterology

## 2016-12-29 VITALS — BP 119/74 | HR 84 | Temp 97.3°F | Ht 65.5 in | Wt 182.0 lb

## 2016-12-29 DIAGNOSIS — K219 Gastro-esophageal reflux disease without esophagitis: Secondary | ICD-10-CM

## 2016-12-29 DIAGNOSIS — R1013 Epigastric pain: Secondary | ICD-10-CM | POA: Diagnosis not present

## 2016-12-29 MED ORDER — DEXLANSOPRAZOLE 60 MG PO CPDR: 60.0000 mg | DELAYED_RELEASE_CAPSULE | Freq: Every day | ORAL | 5 refills | Status: DC

## 2016-12-29 NOTE — Patient Instructions (Signed)
Stop pantoprazole. Start Dexilant 60mg  30 minutes before breakfast daily. Rebate card provided. I could not tell if it is covered by your insurance so if it is too expensive tell them to keep the prescription and call me for an alternative.  Continue miralax and fiber as before.   Call in two weeks and let me know how you are doing. If you are still having significant upper abdominal discomfort, I would advise considering upper endoscopy.

## 2016-12-29 NOTE — Progress Notes (Signed)
Primary Care Physician: Asencion Noble, MD  Primary Gastroenterologist:  Garfield Cornea, MD   Chief Complaint  Patient presents with  . Gastroesophageal Reflux    c/o CP at times (not related to heart)    HPI: Eileen Green is a 66 y.o. female here for follow-up for further refills.  She has a history of constipation, GERD.  Last EGD in 2016 due to early satiety, dyspepsia.  EGD was overall unremarkable.  CT at that time normal.  Colonoscopy up to date, completed in 2015.  Patient was asked to come in for an office visit in order to provide further refills.  She complains of postprandial bloating/feels full. No abdominal pain but after meals feels like food is just sitting in the epigastrium.  No dysphagia.  No vomiting.  Weight is stable.  Symptoms seem to be worse the last few months.  Wonders if she is getting immune to the medication.  Continues MiraLAX daily for constipation with good results.  Takes fiber daily as well.  No melena, brbpr.    Current Outpatient Medications  Medication Sig Dispense Refill  . Carboxymethylcellulose Sodium (THERATEARS OP) Apply to eye as needed.    . hydrochlorothiazide (MICROZIDE) 12.5 MG capsule Take 12.5 mg by mouth daily.     . Lactobacillus (DIGESTIVE HEALTH PROBIOTIC PO) Take 1 tablet by mouth daily.     Marland Kitchen lisinopril (PRINIVIL,ZESTRIL) 10 MG tablet Take 10 mg by mouth daily.    . Methylcellulose, Laxative, 500 MG TABS Take 1 tablet by mouth 2 (two) times daily.    . Misc Natural Products (OSTEO BI-FLEX TRIPLE STRENGTH PO) Take by mouth daily.      . Multiple Vitamin (MULTIVITAMIN) capsule Take 1 capsule by mouth at bedtime.     . pantoprazole (PROTONIX) 40 MG tablet Take 1 tablet (40 mg total) by mouth every morning. 90 tablet 0  . polyethylene glycol (MIRALAX / GLYCOLAX) packet Take 17 g by mouth daily.    . pravastatin (PRAVACHOL) 40 MG tablet Take 1 tablet (40 mg total) by mouth daily. 90 tablet 3  . SYNTHROID 50 MCG tablet Take 1 tablet  (50 mcg total) by mouth daily. 30 tablet 11   No current facility-administered medications for this visit.     Allergies as of 12/29/2016  . (No Known Allergies)    ROS:  General: Negative for anorexia, weight loss, fever, chills, fatigue, weakness. ENT: Negative for hoarseness, difficulty swallowing , nasal congestion. CV: Negative for chest pain, angina, palpitations, dyspnea on exertion, peripheral edema.  Respiratory: Negative for dyspnea at rest, dyspnea on exertion, cough, sputum, wheezing.  GI: See history of present illness. GU:  Negative for dysuria, hematuria, urinary incontinence, urinary frequency, nocturnal urination.  Endo: Negative for unusual weight change.    Physical Examination:   BP 119/74   Pulse 84   Temp (!) 97.3 F (36.3 C) (Oral)   Ht 5' 5.5" (1.664 m)   Wt 182 lb (82.6 kg)   BMI 29.83 kg/m   General: Well-nourished, well-developed in no acute distress.  Eyes: No icterus. Mouth: Oropharyngeal mucosa moist and pink , no lesions erythema or exudate. Lungs: Clear to auscultation bilaterally.  Heart: Regular rate and rhythm, no murmurs rubs or gallops.  Abdomen: Bowel sounds are normal, nontender, nondistended, no hepatosplenomegaly or masses, no abdominal bruits or hernia , no rebound or guarding.   Extremities: No lower extremity edema. No clubbing or deformities. Neuro: Alert and oriented x 4  Skin: Warm and dry, no jaundice.   Psych: Alert and cooperative, normal mood and affect.

## 2016-12-29 NOTE — Assessment & Plan Note (Signed)
66 year old chronic GERD, complaining of worsening dyspepsia and early satiety.  Last EGD 2016, overall unremarkable.  Discussed antireflux measures.  Switch pantoprazole to Dexilant 60 mg 30 minutes before breakfast.  She will call with a progress report in 2 weeks.  If no significant improvement, consider further workup.

## 2017-01-01 NOTE — Progress Notes (Signed)
cc'ed to pcp °

## 2017-02-12 ENCOUNTER — Ambulatory Visit: Payer: Federal, State, Local not specified - PPO | Admitting: Gastroenterology

## 2017-03-22 ENCOUNTER — Telehealth: Payer: Self-pay | Admitting: Internal Medicine

## 2017-03-22 NOTE — Telephone Encounter (Signed)
Pt walked in office around 2:50pm and said she received a letter from her insurance company saying that [place] isn't going to be covered under pts insurance plan. Pt purchased medication 01/2017 and 02/2017. Dexilant samples were given to pt with Dexilant Rx savings card, since pt doesn't have medicare pharmacy benefits. Pt still uses [employer]. When samples were given,pt said she didn't have to have Palomin[place] and doesn't feel like it works better than Pantoprazole. Pt was asked to D/C Pantoprazole 12/2016 and start [place]. Pt said she has taken several  PPI and couldn't tell me which one worked better for her. Please advise if pt should go back to Pantoprazole due to cost or what other PPI pt should take.

## 2017-03-23 NOTE — Telephone Encounter (Signed)
Lmom, waiting on  Return call.

## 2017-03-23 NOTE — Telephone Encounter (Signed)
She can go back to pantoprazole. I had switched her in December due to complains of worsening dyspepsia and early satiety.   How is she feeling now? Resume pantoprazole. Let me know if needs new RX sent.  Return ov in 6 weeks.

## 2017-03-28 NOTE — Telephone Encounter (Signed)
Lmom, waiting on a return call.  

## 2017-03-28 NOTE — Telephone Encounter (Signed)
Spoke with pt and she was notified. Offered to make appointment. Pt said she will call back. Pt will have her pharmacy send a note about refilling Pantoprazole when needed.

## 2017-04-25 ENCOUNTER — Other Ambulatory Visit: Payer: Self-pay | Admitting: Gastroenterology

## 2017-04-30 ENCOUNTER — Telehealth: Payer: Self-pay | Admitting: Internal Medicine

## 2017-04-30 ENCOUNTER — Other Ambulatory Visit: Payer: Self-pay | Admitting: Gastroenterology

## 2017-04-30 ENCOUNTER — Other Ambulatory Visit: Payer: Self-pay

## 2017-04-30 DIAGNOSIS — E785 Hyperlipidemia, unspecified: Secondary | ICD-10-CM

## 2017-04-30 MED ORDER — PRAVASTATIN SODIUM 40 MG PO TABS: 40.0000 mg | ORAL_TABLET | Freq: Every day | ORAL | 1 refills | Status: DC

## 2017-04-30 NOTE — Telephone Encounter (Signed)
LMOM, pt was notified that her RX was taken.

## 2017-04-30 NOTE — Telephone Encounter (Signed)
Lmom for pt. Pt need an RX sent to her pharmacy for Pantoprazole ok per LSL on 3.2019 when pt walked into office.

## 2017-04-30 NOTE — Telephone Encounter (Signed)
RX done. 

## 2017-04-30 NOTE — Telephone Encounter (Signed)
Pt said that her pharmacy [facility] on [address]) hasn't heard from [place] about her getting her refill of pantoprazole and she will be out on Wednesday. Please advise and call her at [phone] [facility] if needed)

## 2017-05-01 ENCOUNTER — Telehealth: Payer: Self-pay

## 2017-05-01 NOTE — Telephone Encounter (Signed)
Pt called this morning asking that a PA be completed on Pantoprazole so she can get a refill. Pt gave a number to call [phone]. Pt is aware that a PA will be done and we will notify her when her medication has been approved or denied. If medication is denied, pt can use a goodrx coupon at a discounted price.

## 2017-05-01 NOTE — Telephone Encounter (Signed)
Called pts insurance compnay at [phone] for Pantoprazole 40mg  (PA). Approval is good through 05/02/19. [facility] notified and asked to get medication ready for pt. Lmom, pt notified.

## 2017-06-08 ENCOUNTER — Telehealth: Payer: Self-pay

## 2017-06-08 ENCOUNTER — Other Ambulatory Visit: Payer: Self-pay

## 2017-06-08 DIAGNOSIS — E785 Hyperlipidemia, unspecified: Secondary | ICD-10-CM

## 2017-06-08 MED ORDER — PRAVASTATIN SODIUM 40 MG PO TABS: 40.0000 mg | ORAL_TABLET | Freq: Every day | ORAL | 2 refills | Status: DC

## 2017-06-08 NOTE — Telephone Encounter (Signed)
Pt came into office requesting a refill her pravastatin until her appointment on 07/02/17 with Dr. Buren Kos. Refill sent to pharmacy.

## 2017-07-01 NOTE — Progress Notes (Signed)
Cardiology Office Note   Date:  07/02/2017   ID:  Eileen Green, DOB 1950/09/08, MRN [id number]  PCP:  Asencion Noble, MD  Cardiologist: Dr. Debara Pickett No chief complaint on file.    History of Present Illness: Eileen Green is a 67 y.o. female who presents for ongoing assessment and management of chest pain and dyspnea.  Other history includes hypertension and dyslipidemia.  Last seen by Dr. Debara Pickett on 02/08/2016 was found to have atypical chest pain, but with multiple cardiovascular risk factors she was advised to continue aggressive risk factor modification.  The patient did have a stress echocardiogram dated 02/02/2016 which was negative for ischemia and good exercise tolerance.  The patient has requested a refill on pravastatin, and is here for medication refills.  She comes today with multiple questions concerning her medications.  She has had complaints of lower extremity myalgia pain.  This is significantly reduced her exercise capacity, and she states that her legs hurt her walking throughout the store or across the parking lot.  She also complains of aching pain in her legs when she is lying down in her bed.  This began after increased dose of pravastatin from 20 mg to 40 mg daily.  She is otherwise without any cardiac complaints, no chest pain, dyspnea on exertion, or worsening fatigue.  She would like to begin a weight loss and exercise program but pain in her legs is bothering her a lot and keeping her from being more active.  Past Medical History:  Diagnosis Date  . Arthritis   . Cancer ([place])    skin melanoma under left eye  . Deaf    LEFT EAR  . GERD (gastroesophageal reflux disease)   . Hyperlipidemia Deaf in Left ear    Past Surgical History:  Procedure Laterality Date  . ABDOMINAL HYSTERECTOMY  2001   Partial  . COLONOSCOPY  01/21/2003   [id number] rectum/ Long capacious colon, moderate prep made the exam more challenging.  Marland Kitchen COLONOSCOPY  05/18/2009   Dr. Gala Romney: Normal  rectum/narrow mouth left sided diverticula   . COLONOSCOPY N/A 05/14/2013   Dr. Gala Romney: hemorrhoids, colonic diverticula, redudant colon  . ESOPHAGOGASTRODUODENOSCOPY  01/21/2003   RMR:  Normal esophagus, stomach, D1 and D2  . ESOPHAGOGASTRODUODENOSCOPY N/A 03/24/2014   Dr. Gala Romney :Hiatal hernia; otherwise negative EGD  . TONSILLECTOMY       Current Outpatient Medications  Medication Sig Dispense Refill  . Carboxymethylcellulose Sodium (THERATEARS OP) Apply to eye as needed.    . Lactobacillus (DIGESTIVE HEALTH PROBIOTIC PO) Take 1 tablet by mouth daily.     Marland Kitchen lisinopril (PRINIVIL,ZESTRIL) 10 MG tablet Take 10 mg by mouth daily.    . Methylcellulose, Laxative, 500 MG TABS Take 1 tablet by mouth 2 (two) times daily.    . Misc Natural Products (OSTEO BI-FLEX TRIPLE STRENGTH PO) Take by mouth daily.      . Multiple Vitamin (MULTIVITAMIN) capsule Take 1 capsule by mouth at bedtime.     . pantoprazole (PROTONIX) 40 MG tablet TAKE 1 TABLET BY MOUTH EVERY MORNING 90 tablet 2  . polyethylene glycol (MIRALAX / GLYCOLAX) packet Take 17 g by mouth daily.    . pravastatin (PRAVACHOL) 20 MG tablet Take 1 tablet (20 mg total) by mouth daily. 30 tablet 5  . SYNTHROID 50 MCG tablet Take 1 tablet (50 mcg total) by mouth daily. 30 tablet 11  . ezetimibe (ZETIA) 10 MG tablet Take 1 tablet (10 mg total) by mouth daily. 30 tablet  5   No current facility-administered medications for this visit.     Allergies:   Patient has no known allergies.    Social History:  The patient  reports that she has never smoked. She has never used smokeless tobacco. She reports that she does not drink alcohol or use drugs.   Family History:  The patient's family history includes Dementia in her mother; Heart disease in her father and mother; Kidney disease in her father; Thyroid nodules in her father.    ROS: All other systems are reviewed and negative. Unless otherwise mentioned in H&P    PHYSICAL EXAM: VS:  BP 116/74    Pulse 71   Ht 5\' 5"  (1.651 m)   Wt 186 lb 9.6 oz (84.6 kg)   BMI 31.05 kg/m  , BMI Body mass index is 31.05 kg/m. GEN: Well nourished, well developed, in no acute distress  HEENT: normal  Neck: no JVD, carotid bruits, or masses Cardiac: RRR; no murmurs, rubs, or gallops,no edema  Respiratory:  Clear to auscultation bilaterally, normal work of breathing GI: soft, nontender, nondistended, + BS MS: no deformity or atrophy very strong dorsalis pedis and posterior tibial pulses 2+ Skin: warm and dry, no rash Neuro:  Strength and sensation are intact Psych: euthymic mood, full affect   EKG: Normal sinus rhythm with sinus arrhythmia, normal PR interval and QT interval.  Heart rate of 71 bpm  Recent Labs: No results found for requested labs within last 8760 hours.    Lipid Panel    Component Value Date/Time   CHOL 159 07/26/2016 0805   TRIG 99 07/26/2016 0805   HDL 48 (L) 07/26/2016 0805   CHOLHDL 3.3 07/26/2016 0805   VLDL 20 07/26/2016 0805   LDLCALC 91 07/26/2016 0805      Wt Readings from Last 3 Encounters:  07/02/17 186 lb 9.6 oz (84.6 kg)  12/29/16 182 lb (82.6 kg)  02/09/16 178 lb (80.7 kg)    Other studies Reviewed: None  ASSESSMENT AND PLAN:  1. Hypercholesterolemia: Labs are followed by Dr. Willey Blade. Most recent labs completed in 12/2016 by PCP. I will decrease the pravastatin to 20 mg from 40 mg and add Zetia 10 mg daily in the setting of myalgia pain. She will call us if the reduced dose does not help with the pain. She has strong distal pulses. Will not proceed with ABI's or vascular work up at this time.   She will have fasting lipids and LFT's drawn in 3 months for follow up. She will see Dr. Debara Pickett in 3 months for further recommendations or need to consider Repatha.   2. Hypertension: Excellent control of BP on lisinopril. No changes in regimen. Labs completed by PCP.   3. GERD: Followed by PCP on PPI. Consider adding magnesium to labs to assess level with  muscle aches and pains on PPI which lowers Mg levels.   Current medicines are reviewed at length with the patient today.    Labs/ tests ordered today include: Fasting lipids and LFT's.   Phill Myron. West Pugh, ANP, AACC   07/02/2017 10:22 AM    [facility] [address] [place], [place] [address] Phone: [phone]; Fax: [phone]

## 2017-07-02 ENCOUNTER — Encounter: Payer: Self-pay | Admitting: Adult Health

## 2017-07-02 ENCOUNTER — Ambulatory Visit: Payer: Federal, State, Local not specified - PPO | Admitting: Adult Health

## 2017-07-02 VITALS — BP 116/74 | HR 71 | Ht 65.0 in | Wt 186.6 lb

## 2017-07-02 DIAGNOSIS — I10 Essential (primary) hypertension: Secondary | ICD-10-CM | POA: Diagnosis not present

## 2017-07-02 DIAGNOSIS — E785 Hyperlipidemia, unspecified: Secondary | ICD-10-CM

## 2017-07-02 DIAGNOSIS — Z79899 Other long term (current) drug therapy: Secondary | ICD-10-CM

## 2017-07-02 MED ORDER — EZETIMIBE 10 MG PO TABS: 10.0000 mg | ORAL_TABLET | Freq: Every day | ORAL | 5 refills | Status: DC

## 2017-07-02 MED ORDER — PRAVASTATIN SODIUM 20 MG PO TABS: 20.0000 mg | ORAL_TABLET | Freq: Every day | ORAL | 5 refills | Status: DC

## 2017-07-02 NOTE — Patient Instructions (Addendum)
Medication Instructions:  DECREASE PRAVASTATIN 20MG  DAILY  START ZETIA 10MG  DAILY  If you need a refill on your cardiac medications before your next appointment, please call your pharmacy.  Labwork: FLP & LFT 1 WEEK BEFORE FOLLOW UP APPT HERE IN OUR OFFICE AT [facility]  You will need to fast. DO NOT EAT OR DRINK PAST MIDNIGHT.   You will NOT need to fast   Special Instructions: PLEASE FOLLOW LOW CHOLESTEROL DIET  Follow-Up: Your physician wants you to follow-up in: 3 MONTHS WITH DR HILTY -OR- KATHRYN LAWRENCE ([place]), DNP,AACC IF PRIMARY CARDIOLOGIST IS UNAVAILABLE.   Thank you for choosing [facility]!!      Cholesterol Cholesterol is a fat. Your body needs a small amount of cholesterol. Cholesterol (plaque) may build up in your blood vessels (arteries). That makes you more likely to have a heart attack or stroke. You cannot feel your cholesterol level. Having a blood test is the only way to find out if your level is high. Keep your test results. Work with your doctor to keep your cholesterol at a good level. What do the results mean?  Total cholesterol is how much cholesterol is in your blood.  LDL is bad cholesterol. This is the type that can build up. Try to have low LDL.  HDL is good cholesterol. It cleans your blood vessels and carries LDL away. Try to have high HDL.  Triglycerides are fat that the body can store or burn for energy. What are good levels of cholesterol?  Total cholesterol below 200.  LDL below 100 is good for people who have health risks. LDL below 70 is good for people who have very high risks.  HDL above 40 is good. It is best to have HDL of 60 or higher.  Triglycerides below 150. How can I lower my cholesterol? Diet Follow your diet program as told by your doctor.  Choose fish, white meat chicken, or [place] that is roasted or baked. Try not to eat red meat, fried foods, sausage, or lunch meats.  Eat lots of fresh  fruits and vegetables.  Choose whole grains, beans, pasta, potatoes, and cereals.  Choose olive oil, corn oil, or canola oil. Only use small amounts.  Try not to eat butter, mayonnaise, shortening, or palm kernel oils.  Try not to eat foods with trans fats.  Choose low-fat or nonfat dairy foods. ? Drink skim or nonfat milk. ? Eat low-fat or nonfat yogurt and cheeses. ? Try not to drink whole milk or cream. ? Try not to eat ice cream, egg yolks, or full-fat cheeses.  Healthy desserts include angel food cake, ginger snaps, animal crackers, hard candy, popsicles, and low-fat or nonfat frozen yogurt. Try not to eat pastries, cakes, pies, and cookies.  Exercise Follow your exercise program as told by your doctor.  Be more active. Try gardening, walking, and taking the stairs.  Ask your doctor about ways that you can be more active.  Medicine  Take over-the-counter and prescription medicines only as told by your doctor. This information is not intended to replace advice given to you by your health care provider. Make sure you discuss any questions you have with your health care provider. Document Released: 03/17/2008 Document Revised: 07/21/2015 Document Reviewed: 07/01/2015 Elsevier Interactive Patient Education  2018 [employer].   Fat and Cholesterol Restricted Diet Getting too much fat and cholesterol in your diet may cause health problems. Following this diet helps keep your fat and cholesterol at normal  levels. This can keep you from getting sick. What types of fat should I choose?  Choose monosaturated and polyunsaturated fats. These are found in foods such as olive oil, canola oil, flaxseeds, walnuts, almonds, and seeds.  Eat more omega-3 fats. Good choices include salmon, mackerel, sardines, tuna, flaxseed oil, and ground flaxseeds.  Limit saturated fats. These are in animal products such as meats, butter, and cream. They can also be in plant products such as palm oil,  palm kernel oil, and coconut oil.  Avoid foods with partially hydrogenated oils in them. These contain trans fats. Examples of foods that have trans fats are stick margarine, some tub margarines, cookies, crackers, and other baked goods. What general guidelines do I need to follow?  Check food labels. Look for the words "trans fat" and "saturated fat."  When preparing a meal: ? Fill half of your plate with vegetables and green salads. ? Fill one fourth of your plate with whole grains. Look for the word "whole" as the first word in the ingredient list. ? Fill one fourth of your plate with lean protein foods.  Eat more foods that have fiber, like apples, carrots, beans, peas, and barley.  Eat more home-cooked foods. Eat less at restaurants and buffets.  Limit or avoid alcohol.  Limit foods high in starch and sugar.  Limit fried foods.  Cook foods without frying them. Baking, boiling, grilling, and broiling are all great options.  Lose weight if you are overweight. Losing even a small amount of weight can help your overall health. It can also help prevent diseases such as diabetes and heart disease. What foods can I eat? Grains Whole grains, such as whole wheat or whole grain breads, crackers, cereals, and pasta. Unsweetened oatmeal, bulgur, barley, quinoa, or brown rice. Corn or whole wheat flour tortillas. Vegetables Fresh or frozen vegetables (raw, steamed, roasted, or grilled). Green salads. Fruits All fresh, canned (in natural juice), or frozen fruits. Meat and Other Protein Products Ground beef (85% or leaner), grass-fed beef, or beef trimmed of fat. Skinless chicken or [place]. Ground chicken or [place]. Pork trimmed of fat. All fish and seafood. Eggs. Dried beans, peas, or lentils. Unsalted nuts or seeds. Unsalted canned or dry beans. Dairy Low-fat dairy products, such as skim or 1% milk, 2% or reduced-fat cheeses, low-fat ricotta or cottage cheese, or plain low-fat  yogurt. Fats and Oils Tub margarines without trans fats. Light or reduced-fat mayonnaise and salad dressings. Avocado. Olive, canola, sesame, or safflower oils. Natural peanut or almond butter (choose ones without added sugar and oil). The items listed above may not be a complete list of recommended foods or beverages. Contact your dietitian for more options. What foods are not recommended? Grains White bread. White pasta. White rice. Cornbread. Bagels, pastries, and croissants. Crackers that contain trans fat. Vegetables White potatoes. Corn. Creamed or fried vegetables. Vegetables in a cheese sauce. Fruits Dried fruits. Canned fruit in light or heavy syrup. Fruit juice. Meat and Other Protein Products Fatty cuts of meat. Ribs, chicken wings, bacon, sausage, bologna, salami, chitterlings, fatback, hot dogs, bratwurst, and packaged luncheon meats. Liver and organ meats. Dairy Whole or 2% milk, cream, half-and-half, and cream cheese. Whole milk cheeses. Whole-fat or sweetened yogurt. Full-fat cheeses. Nondairy creamers and whipped toppings. Processed cheese, cheese spreads, or cheese curds. Sweets and Desserts Corn syrup, sugars, honey, and molasses. Candy. Jam and jelly. Syrup. Sweetened cereals. Cookies, pies, cakes, donuts, muffins, and ice cream. Fats and Oils Butter, stick margarine, lard, shortening,  ghee, or bacon fat. Coconut, palm kernel, or palm oils. Beverages Alcohol. Sweetened drinks (such as sodas, lemonade, and fruit drinks or punches). The items listed above may not be a complete list of foods and beverages to avoid. Contact your dietitian for more information. This information is not intended to replace advice given to you by your health care provider. Make sure you discuss any questions you have with your health care provider. Document Released: 06/20/2011 Document Revised: 08/26/2015 Document Reviewed: 03/20/2013 Elsevier Interactive Patient Education  [employer].

## 2017-07-03 NOTE — Addendum Note (Signed)
Addended by: Vennie Homans on: 07/03/2017 02:43 PM   Modules accepted: Orders

## 2017-07-10 ENCOUNTER — Telehealth: Payer: Self-pay | Admitting: Internal Medicine

## 2017-07-10 NOTE — Telephone Encounter (Signed)
Spoke with pt. Pt was looking for a probiotic otc. Pt was previously taking digestive advantange. Pt is going to buy an  otc probiotic pills. If pt has a question about probiotics, she is going to ask the pharmacist.

## 2017-07-10 NOTE — Telephone Encounter (Signed)
Pt has questions about Digestive Advantage and would like to speak with the nurse. [phone]

## 2017-09-11 ENCOUNTER — Ambulatory Visit (HOSPITAL_COMMUNITY)
Admission: RE | Admit: 2017-09-11 | Discharge: 2017-09-11 | Disposition: A | Discharge status: Home / Self Care | Payer: Federal, State, Local not specified - PPO | Source: Ambulatory Visit | Attending: Internal Medicine | Admitting: Internal Medicine

## 2017-09-11 ENCOUNTER — Other Ambulatory Visit (HOSPITAL_COMMUNITY): Payer: Self-pay | Admitting: Internal Medicine

## 2017-09-23 LAB — LIPID PANEL
CHOLESTEROL TOTAL: 148 mg/dL (ref 100–199)
Chol/HDL Ratio: 3.2 ratio (ref 0.0–4.4)
HDL: 46 mg/dL (ref >39)
LDL CALC: 84 mg/dL (ref 0–99)
TRIGLYCERIDES: 92 mg/dL (ref 0–149)
VLDL Cholesterol Cal: 18 mg/dL (ref 5–40)

## 2017-09-23 LAB — HEPATIC FUNCTION PANEL
ALT: 31 IU/L (ref 0–32)
AST: 25 IU/L (ref 0–40)
Albumin: 4.6 g/dL (ref 3.6–4.8)
Alkaline Phosphatase: 106 IU/L (ref 39–117)
BILIRUBIN TOTAL: 0.5 mg/dL (ref 0.0–1.2)
Bilirubin, Direct: 0.1 mg/dL (ref 0.00–0.40)
Total Protein: 6.5 g/dL (ref 6.0–8.5)

## 2017-10-03 ENCOUNTER — Encounter: Payer: Self-pay | Admitting: Internal Medicine

## 2017-10-03 ENCOUNTER — Ambulatory Visit: Payer: Federal, State, Local not specified - PPO | Admitting: Internal Medicine

## 2017-10-03 VITALS — BP 146/62 | HR 85 | Ht 65.5 in | Wt 186.4 lb

## 2017-10-03 DIAGNOSIS — I10 Essential (primary) hypertension: Secondary | ICD-10-CM | POA: Diagnosis not present

## 2017-10-03 DIAGNOSIS — E785 Hyperlipidemia, unspecified: Secondary | ICD-10-CM | POA: Diagnosis not present

## 2017-10-03 NOTE — Progress Notes (Signed)
OFFICE NOTE  Chief Complaint:  Routine follow-up  Primary Care Physician: Asencion Noble, MD  HPI:  Eileen Green is a 67 y.o. female patient referred by Dr. Willey Blade for evaluation of chest pain. She reports she has an episode of chest pain about every month. It might be related to stress. 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 She does have a family history of heart disease with her mother who had an MI in her 18s and her father who had chronic kidney disease and was on dialysis. She denies any worsening shortness of breath or increased fatigue or decreased exercise tolerance. She does have additional risk factors including hypertension and dyslipidemia which are well controlled. Her recent LDL cholesterol was 108 however she is on a low-dose of a low potency statin. She does not have a history of any side effects on that.  02/08/2016  Eileen Green underwent a stress echocardiogram which was negative for ischemia. She had good exercise tolerance despite having had an upper respiratory infection about a week prior to that. She says she was able to tolerate exercise without any significant shortness of breath which is a good sign. Oxygen saturation today however was only 95% and she says she's recently had some cough. Blood pressure is at goal. I felt like her chest pain is noncardiac and seem somewhat atypical. She does have cardio Gerald Stabs factors and a family history of premature coronary disease and I agree with aggressive primary prevention.   10/03/2017  Eileen Green returns today for follow-up.  She was seen this past summer by Jory Sims, DNP, and has been asymptomatic.  She did report that she was having some cramping in her calves.  Was thought this might be due to her pravastatin.  The dose was decreased from 40 to 20 mg and she was placed on ezetimibe.  Repeat lipid profile testing was performed 11 days ago which demonstrated a nice improvement in her  lipid profile total cholesterol 148, triglycerides 92, HDL 46 and LDL 84.  She reports she still has some possible statin side effects.  PMHx:  Past Medical History:  Diagnosis Date  . Arthritis   . Cancer ([place])    skin melanoma under left eye  . Deaf    LEFT EAR  . GERD (gastroesophageal reflux disease)   . Hyperlipidemia Deaf in Left ear    Past Surgical History:  Procedure Laterality Date  . ABDOMINAL HYSTERECTOMY  2001   Partial  . COLONOSCOPY  01/21/2003   [id number] rectum/ Long capacious colon, moderate prep made the exam more challenging.  Marland Kitchen COLONOSCOPY  05/18/2009   Dr. Gala Romney: Normal rectum/narrow mouth left sided diverticula   . COLONOSCOPY N/A 05/14/2013   Dr. Gala Romney: hemorrhoids, colonic diverticula, redudant colon  . ESOPHAGOGASTRODUODENOSCOPY  01/21/2003   RMR:  Normal esophagus, stomach, D1 and D2  . ESOPHAGOGASTRODUODENOSCOPY N/A 03/24/2014   Dr. Gala Romney :Hiatal hernia; otherwise negative EGD  . TONSILLECTOMY      FAMHx:  Family History  Problem Relation Age of Onset  . Heart disease Mother   . Dementia Mother   . Heart disease Father   . Kidney disease Father   . Thyroid nodules Father   . Colon cancer Neg Hx     SOCHx:   reports that she has never smoked. She has never used smokeless tobacco. She reports that she does not drink alcohol or use drugs.  ALLERGIES:  No Known  Allergies  ROS: Pertinent items noted in HPI and remainder of comprehensive ROS otherwise negative.  HOME MEDS: Current Outpatient Medications on File Prior to Visit  Medication Sig Dispense Refill  . Carboxymethylcellulose Sodium (THERATEARS OP) Apply to eye as needed.    . ezetimibe (ZETIA) 10 MG tablet Take 1 tablet (10 mg total) by mouth daily. 30 tablet 5  . Lactobacillus (DIGESTIVE HEALTH PROBIOTIC PO) Take 1 tablet by mouth daily.     Marland Kitchen lisinopril (PRINIVIL,ZESTRIL) 10 MG tablet Take 10 mg by mouth daily.    . Methylcellulose, Laxative, 500 MG TABS Take 2 tablets by mouth  daily.     . Misc Natural Products (OSTEO BI-FLEX TRIPLE STRENGTH PO) Take by mouth daily.      . Multiple Vitamin (MULTIVITAMIN) capsule Take 1 capsule by mouth at bedtime.     . pantoprazole (PROTONIX) 40 MG tablet TAKE 1 TABLET BY MOUTH EVERY MORNING 90 tablet 2  . polyethylene glycol (MIRALAX / GLYCOLAX) packet Take 17 g by mouth daily.    . pravastatin (PRAVACHOL) 20 MG tablet Take 1 tablet (20 mg total) by mouth daily. 30 tablet 5  . SYNTHROID 50 MCG tablet Take 1 tablet (50 mcg total) by mouth daily. 30 tablet 11   No current facility-administered medications on file prior to visit.     LABS/IMAGING: No results found for this or any previous visit (from the past 48 hour(s)). No results found.  WEIGHTS: Wt Readings from Last 3 Encounters:  10/03/17 186 lb 6.4 oz (84.6 kg)  07/02/17 186 lb 9.6 oz (84.6 kg)  12/29/16 182 lb (82.6 kg)    VITALS: BP (!) 146/62   Pulse 85   Ht 5' 5.5" (1.664 m)   Wt 186 lb 6.4 oz (84.6 kg)   BMI 30.55 kg/m   EXAM: General appearance: alert and no distress Neck: no carotid bruit, no JVD and thyroid not enlarged, symmetric, no tenderness/mass/nodules Lungs: clear to auscultation bilaterally Heart: regular rate and rhythm Abdomen: soft, non-tender; bowel sounds normal; no masses,  no organomegaly Extremities: extremities normal, atraumatic, no cyanosis or edema Pulses: 2+ and symmetric Skin: Skin color, texture, turgor normal. No rashes or lesions Neurologic: Grossly normal Psych: Pleasant  EKG: Normal sinus rhythm at 85-personally reviewed  ASSESSMENT: 1. Intermittent chest pain - negative stress echo (2018) 2. Hypertension-controlled 3. Dyslipidemia 4. Family history of coronary disease 5. Murmur  PLAN: 1.   Eileen Green may be having SAMS to pravastatin.  I would recommend she discontinue pravastatin completely to see if her symptoms resolved.  She should remain on ezetimibe.  Her cholesterol has improved and is at goal LDL less  than 100.  She is asymptomatic otherwise.  She should contact us with symptomatic improvement.  If she notices no changes then she should resume pravastatin 20 mg daily in addition to the ezetimibe.  Otherwise, we will plan a lipid profile in 3 months.  She should continue aggressive dietary modification, reduce cholesterol and saturated fat intake (she recently stopped eating daily eggs) and increase exercise.  And follow-up annually or sooner as necessary.  Pixie Casino, MD, [facility], [place] Director of the [facility]  [facility] Diplomate of the American Board of Clinical Lipidology Attending Cardiologist  Direct Dial: [phone]  Fax: [phone]  Website:  [email or web address] 10/03/2017, 1:29 PM

## 2017-10-03 NOTE — Patient Instructions (Addendum)
Medication Instructions:   STOP pravastatin  CONTINUE other medications Notify our office in a few weeks if your symptoms have improved or not  Labwork:  FASTING lab work in 3 months to reassess cholesterol  Testing/Procedures:  NONE  Follow-Up:  Your physician wants you to follow-up in: ONE YEAR with Dr. Debara Pickett. You will receive a reminder letter in the mail two months in advance. If you don't receive a letter, please call our office to schedule the follow-up appointment.   If you need a refill on your cardiac medications before your next appointment, please call your pharmacy.  Any Other Special Instructions Will Be Listed Below (If Applicable).

## 2017-12-10 ENCOUNTER — Other Ambulatory Visit (HOSPITAL_COMMUNITY): Payer: Self-pay | Admitting: Internal Medicine

## 2017-12-10 DIAGNOSIS — R51 Headache: Principal | ICD-10-CM

## 2017-12-10 DIAGNOSIS — R519 Headache, unspecified: Secondary | ICD-10-CM

## 2017-12-24 ENCOUNTER — Other Ambulatory Visit: Payer: Self-pay | Admitting: *Deleted

## 2017-12-24 MED ORDER — EZETIMIBE 10 MG PO TABS: 10.0000 mg | ORAL_TABLET | Freq: Every day | ORAL | 11 refills | Status: DC

## 2017-12-25 ENCOUNTER — Ambulatory Visit (HOSPITAL_COMMUNITY)
Admission: RE | Admit: 2017-12-25 | Discharge: 2017-12-25 | Disposition: A | Discharge status: Home / Self Care | Payer: Federal, State, Local not specified - PPO | Source: Ambulatory Visit | Attending: Internal Medicine | Admitting: Internal Medicine

## 2017-12-25 DIAGNOSIS — R51 Headache: Secondary | ICD-10-CM | POA: Insufficient documentation

## 2017-12-25 DIAGNOSIS — R519 Headache, unspecified: Secondary | ICD-10-CM

## 2017-12-25 LAB — POCT I-STAT CREATININE: Creatinine, Ser: 0.9 mg/dL (ref 0.44–1.00)

## 2017-12-25 MED ORDER — IOHEXOL 300 MG/ML  SOLN
75.0000 mL | Freq: Once | INTRAMUSCULAR | Status: AC | PRN
  Administered 2017-12-25: 75 mL via INTRAVENOUS

## 2018-01-24 ENCOUNTER — Other Ambulatory Visit: Payer: Self-pay | Admitting: Gastroenterology

## 2018-02-17 LAB — LIPID PANEL
Chol/HDL Ratio: 3.6 ratio (ref 0.0–4.4)
Cholesterol, Total: 187 mg/dL (ref 100–199)
HDL: 52 mg/dL (ref >39)
LDL Calculated: 121 mg/dL — ABNORMAL HIGH (ref 0–99)
Triglycerides: 69 mg/dL (ref 0–149)
VLDL CHOLESTEROL CAL: 14 mg/dL (ref 5–40)

## 2018-05-21 ENCOUNTER — Telehealth: Payer: Self-pay | Admitting: Internal Medicine

## 2018-05-21 DIAGNOSIS — E785 Hyperlipidemia, unspecified: Secondary | ICD-10-CM

## 2018-05-21 MED ORDER — ROSUVASTATIN CALCIUM 5 MG PO TABS: 5.0000 mg | ORAL_TABLET | Freq: Every day | ORAL | 3 refills | Status: DC

## 2018-05-21 NOTE — Telephone Encounter (Signed)
Spoke with patient and provided MD advice/recommendations. She will start crestor 5mg  once weekly in addition to zetia and was advised to notify us if she has SE. Rx(s) sent to pharmacy electronically. Lab order mailed

## 2018-05-21 NOTE — Telephone Encounter (Signed)
She was taken off statin due to side effects - may only be on Zetia. Above goal LDL <100. Would she be interested in trying something else? If so - we could try low dose crestor 5 mg once weekly and continue zetia. She can have repeat lipid testing in 6 months just before follow-up.  Dr. Lemmie Evens

## 2018-05-21 NOTE — Telephone Encounter (Signed)
Pt had blood work done on 02/15. She got a letter stating her results were abnormal. She is not due to see Dr. Debara Pickett until October. The patient would like to know if she will have to get new labs done before her October appointment, or if her labs done in February are sufficient.

## 2018-05-21 NOTE — Telephone Encounter (Signed)
Returned call to patient she wanted to ask Dr.Hilty if she needs LDL checked before Oct.Stated her last LDL was checked in Feb and was 121.Advised I will send message to Dr.Hilty for advice.

## 2018-05-23 ENCOUNTER — Telehealth: Payer: Self-pay | Admitting: Internal Medicine

## 2018-05-23 MED ORDER — ROSUVASTATIN CALCIUM 5 MG PO TABS: ORAL_TABLET | ORAL | 3 refills | Status: DC

## 2018-05-23 NOTE — Telephone Encounter (Signed)
Rx for crestor 5mg  once weekly (as denoted during last appointment) was sent in to pharmacy for the correct # of tablets but incorrect instructions of QD. Spoke with patient and clarified that she is to take once a week and that Rx has been corrected with pharmacy with instructions to hold prescription until patient needs refill.

## 2018-07-25 ENCOUNTER — Telehealth: Payer: Self-pay

## 2018-07-25 NOTE — Telephone Encounter (Signed)
Approval letter for Pantoprazole will be scanned in pts chart. Good through 08/02/2019

## 2018-07-25 NOTE — Telephone Encounter (Signed)
PA for Pantoprazole 40 mg has been approved.

## 2018-08-14 ENCOUNTER — Ambulatory Visit: Payer: Self-pay

## 2018-08-14 ENCOUNTER — Other Ambulatory Visit: Payer: Self-pay

## 2018-08-14 ENCOUNTER — Encounter: Payer: Self-pay | Admitting: Orthopaedic Surgery

## 2018-08-14 ENCOUNTER — Ambulatory Visit (INDEPENDENT_AMBULATORY_CARE_PROVIDER_SITE_OTHER): Payer: Federal, State, Local not specified - PPO | Admitting: Orthopaedic Surgery

## 2018-08-14 VITALS — Ht 65.0 in | Wt 183.0 lb

## 2018-08-14 DIAGNOSIS — M25551 Pain in right hip: Secondary | ICD-10-CM

## 2018-08-14 DIAGNOSIS — M1611 Unilateral primary osteoarthritis, right hip: Secondary | ICD-10-CM | POA: Insufficient documentation

## 2018-08-14 NOTE — Progress Notes (Signed)
Office Visit Note   Patient: Eileen Green           Date of Birth: 12/30/1950           MRN: [id number] Visit Date: 08/14/2018              Requested by: Asencion Noble, MD [address] [place],  [place] [address] PCP: Asencion Noble, MD   Assessment & Plan: Visit Diagnoses:  1. Pain in right hip   2. Unilateral primary osteoarthritis, right hip     Plan: Primary osteoarthritis right hip.  Long discussion regarding diagnosis and treatment options.  Would suggest a course of anti-inflammatory medicines and exercise.  Consider cortisone injection if no relief with the above.  Have also discussed hip replacement at some point in the future.  Eileen Green is not ready for that yet  Follow-Up Instructions: Return if symptoms worsen or fail to improve.   Orders:  Orders Placed This Encounter  Procedures  . XR Lumbar Spine 2-3 Views  . XR Pelvis 1-2 Views   No orders of the defined types were placed in this encounter.     Procedures: No procedures performed   Clinical Data: No additional findings.   Subjective: Chief Complaint  Patient presents with  . Right Hip - Pain  Patient presents today for right hip pain. She said that it has been hurting for around 5 months. She said that it hurts laterally and wakes her at night if she lays on that side. It also hurts in the groin area. She takes OTC pain medicine as needed.  Eileen Green relates that her predominant pain is localized along the lateral aspect of her right hip that radiates into the right groin.  She is having difficulty with certain positions i.e. getting in and out of a car and even sitting.  She has had some pain along the anterior thigh.  Really not having much back pain.  No numbness or tingling.  She has been taking Osteo Bi-Flex and not sure it made much of a difference.  She had many questions regarding her diagnosis and activity modifications.  HPI  Review of Systems   Objective: Vital Signs: Ht 5\' 5"   (1.651 m)   Wt 183 lb (83 kg)   BMI 30.45 kg/m   Physical Exam Constitutional:      Appearance: She is well-developed.  Eyes:     Pupils: Pupils are equal, round, and reactive to light.  Pulmonary:     Effort: Pulmonary effort is normal.  Skin:    General: Skin is warm and dry.  Neurological:     Mental Status: She is alert and oriented to person, place, and time.  Psychiatric:        Behavior: Behavior normal.     Ortho Exam awake alert and oriented x3.  Comfortable sitting.  Did have considerable loss of motion of her right hip with internal and external rotation with pain.  She only had about 10 to 15 degrees of internal and external rotation.  May be some mild thigh atrophy compared to the left.  No knee pain.  No distal edema.  Motor exam intact.  Straight leg raise negative.  No percussible tenderness of the lumbar spine  Specialty Comments:  No specialty comments available.  Imaging: Xr Lumbar Spine 2-3 Views  Result Date: 08/14/2018 Films of the lumbar spine demonstrate degenerative disc disease at L5-S1 with narrowing and anterior and posterior osteophytes.  There are degenerative  changes of the facet joints at the same level i.e. L5-S1.  No evidence of compression fracture or listhesis.  There is a mild left-sided degenerative scoliosis of probably 5 degrees.  Xr Pelvis 1-2 Views  Result Date: 08/14/2018 AP the pelvis demonstrates significant degenerative arthrosis of the right hip.  There is subchondral sclerosis on both sides of the joint and narrowing compared to the left side.  No evidence of a fracture.  Left hip looks relatively clear    PMFS History: Patient Active Problem List   Diagnosis Date Noted  . Unilateral primary osteoarthritis, right hip 08/14/2018  . Epigastric discomfort 12/29/2016  . Dyslipidemia 01/13/2016  . Murmur 01/13/2016  . Essential hypertension 01/13/2016  . Hiatal hernia   . Dyspepsia 03/18/2014  . RLQ abdominal pain 08/08/2013   . 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 HYPOTHYROIDISM 11/05/2008  . Constipation 11/04/2008  . GERD 06/18/2008  . CHEST PAIN 06/18/2008   Past Medical History:  Diagnosis Date  . Arthritis   . Cancer ([place])    skin melanoma under left eye  . Deaf    LEFT EAR  . GERD (gastroesophageal reflux disease)   . Hyperlipidemia Deaf in Left ear    Family History  Problem Relation Age of Onset  . Heart disease Mother   . Dementia Mother   . Heart disease Father   . Kidney disease Father   . Thyroid nodules Father   . Colon cancer Neg Hx     Past Surgical History:  Procedure Laterality Date  . ABDOMINAL HYSTERECTOMY  2001   Partial  . COLONOSCOPY  01/21/2003   [id number] rectum/ Long capacious colon, moderate prep made the exam more challenging.  Marland Kitchen COLONOSCOPY  05/18/2009   Dr. Gala Romney: Normal rectum/narrow mouth left sided diverticula   . COLONOSCOPY N/A 05/14/2013   Dr. Gala Romney: hemorrhoids, colonic diverticula, redudant colon  . ESOPHAGOGASTRODUODENOSCOPY  01/21/2003   RMR:  Normal esophagus, stomach, D1 and D2  . ESOPHAGOGASTRODUODENOSCOPY N/A 03/24/2014   Dr. Gala Romney :Hiatal hernia; otherwise negative EGD  . TONSILLECTOMY     Social History   Occupational History  . 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 Smoking status: Never Smoker  . Smokeless tobacco: Never Used  . Tobacco comment: Never smoked  Substance and Sexual Activity  . Alcohol use: No  . Drug use: No  . Sexual activity: Not on file

## 2018-09-19 ENCOUNTER — Other Ambulatory Visit: Payer: Self-pay

## 2018-09-19 ENCOUNTER — Ambulatory Visit: Payer: Federal, State, Local not specified - PPO | Admitting: Orthopaedic Surgery

## 2018-09-19 ENCOUNTER — Encounter: Payer: Self-pay | Admitting: Orthopaedic Surgery

## 2018-09-19 VITALS — BP 174/88 | HR 92 | Ht 65.0 in | Wt 183.0 lb

## 2018-09-19 DIAGNOSIS — M1611 Unilateral primary osteoarthritis, right hip: Secondary | ICD-10-CM

## 2018-09-19 NOTE — Progress Notes (Signed)
Office Visit Note   Patient: Eileen Green           Date of Birth: 15-Aug-1950           MRN: [id number] Visit Date: 09/19/2018              Requested by: Asencion Noble, MD [address] [place],  [place] [address] PCP: Asencion Noble, MD   Assessment & Plan: Visit Diagnoses:  1. Unilateral primary osteoarthritis, right hip     Plan: Primary osteoarthritis right hip.  Eileen Green thought she was coming to the office today to receive an intra-articular cortisone injection.  We will schedule this for her discussed the procedure and what she may expect.  Again have discussed the possibility of total hip replacement Follow-Up Instructions: Return if symptoms worsen or fail to improve.   Orders:  No orders of the defined types were placed in this encounter.  No orders of the defined types were placed in this encounter.     Procedures: No procedures performed   Clinical Data: No additional findings.   Subjective: Chief Complaint  Patient presents with  . Right Hip - Follow-up  Patient presents today for follow up on her right hip osteoarthritis. She was here five weeks ago and has been taking Aleve. She said that it has improved some, but still having difficulties with putting her shoes on. She is wanting to get a cortisone injection. Prior films demonstrate significant osteoarthritis of the right hip.  Eileen Green thought she was coming to the office to receive an intra-articular injection  HPI  Review of Systems   Objective: Vital Signs: BP (!) 174/88   Pulse 92   Ht 5\' 5"  (1.651 m)   Wt 183 lb (83 kg)   BMI 30.45 kg/m   Physical Exam  Ortho Exam right hip with painful internal and external rotation.  I can only bring her hip to neutral with no internal rotation based on her arthritis.  Neurologically intact.  Does walk with a limp  Specialty Comments:  No specialty comments available.  Imaging: No results found.   PMFS History: Patient Active Problem  List   Diagnosis Date Noted  . Unilateral primary osteoarthritis, right hip 08/14/2018  . Epigastric discomfort 12/29/2016  . Dyslipidemia 01/13/2016  . Murmur 01/13/2016  . Essential hypertension 01/13/2016  . Hiatal hernia   . Dyspepsia 03/18/2014  . RLQ abdominal pain 08/08/2013  . 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 HYPOTHYROIDISM 11/05/2008  . Constipation 11/04/2008  . GERD 06/18/2008  . CHEST PAIN 06/18/2008   Past Medical History:  Diagnosis Date  . Arthritis   . Cancer ([place])    skin melanoma under left eye  . Deaf    LEFT EAR  . GERD (gastroesophageal reflux disease)   . Hyperlipidemia Deaf in Left ear    Family History  Problem Relation Age of Onset  . Heart disease Mother   . Dementia Mother   . Heart disease Father   . Kidney disease Father   . Thyroid nodules Father   . Colon cancer Neg Hx     Past Surgical History:  Procedure Laterality Date  . ABDOMINAL HYSTERECTOMY  2001   Partial  . COLONOSCOPY  01/21/2003   [id number] rectum/ Long capacious colon, moderate prep made the exam more challenging.  Marland Kitchen COLONOSCOPY  05/18/2009   Dr. Gala Romney: Normal rectum/narrow mouth left sided diverticula   . COLONOSCOPY N/A 05/14/2013   Dr. Gala Romney: hemorrhoids, colonic diverticula,  redudant colon  . ESOPHAGOGASTRODUODENOSCOPY  01/21/2003   RMR:  Normal esophagus, stomach, D1 and D2  . ESOPHAGOGASTRODUODENOSCOPY N/A 03/24/2014   Dr. Gala Romney :Hiatal hernia; otherwise negative EGD  . TONSILLECTOMY     Social History   Occupational History  . 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 Smoking status: Never Smoker  . Smokeless tobacco: Never Used  . Tobacco comment: Never smoked  Substance and Sexual Activity  . Alcohol use: No  . Drug use: No  . Sexual activity: Not on file

## 2018-09-19 NOTE — Addendum Note (Signed)
Addended by: Lendon Collar on: 09/19/2018 04:55 PM   Modules accepted: Orders

## 2018-09-21 LAB — LIPID PANEL
Chol/HDL Ratio: 3 ratio (ref 0.0–4.4)
Cholesterol, Total: 166 mg/dL (ref 100–199)
HDL: 56 mg/dL (ref >39)
LDL Chol Calc (NIH): 97 mg/dL (ref 0–99)
Triglycerides: 70 mg/dL (ref 0–149)
VLDL Cholesterol Cal: 13 mg/dL (ref 5–40)

## 2018-10-03 ENCOUNTER — Ambulatory Visit: Payer: Federal, State, Local not specified - PPO | Admitting: Internal Medicine

## 2018-10-03 ENCOUNTER — Encounter: Payer: Self-pay | Admitting: Internal Medicine

## 2018-10-03 ENCOUNTER — Other Ambulatory Visit: Payer: Self-pay

## 2018-10-03 VITALS — BP 130/84 | HR 72 | Temp 96.8°F | Ht 65.0 in | Wt 186.0 lb

## 2018-10-03 DIAGNOSIS — E785 Hyperlipidemia, unspecified: Secondary | ICD-10-CM

## 2018-10-03 DIAGNOSIS — R011 Cardiac murmur, unspecified: Secondary | ICD-10-CM | POA: Diagnosis not present

## 2018-10-03 DIAGNOSIS — I10 Essential (primary) hypertension: Secondary | ICD-10-CM | POA: Diagnosis not present

## 2018-10-03 NOTE — Patient Instructions (Signed)
Medication Instructions:  Your physician recommends that you continue on your current medications as directed. Please refer to the Current Medication list given to you today.  If you need a refill on your cardiac medications before your next appointment, please call your pharmacy.    Follow-Up: At [facility], you and your health needs are our priority.  As part of our continuing mission to provide you with exceptional heart care, we have created designated Provider Care Teams.  These Care Teams include your primary Cardiologist (physician) and Advanced Practice Providers (APPs -  Physician Assistants and Nurse Practitioners) who all work together to provide you with the care you need, when you need it. You will need a follow up appointment in 12 months.  Please call our office 2 months in advance to schedule this appointment.  You may see Dr. Hilty or one of the following Advanced Practice Providers on your designated Care Team: Hao Meng, PA-C . Angela Duke, PA-C  Any Other Special Instructions Will Be Listed Below (If Applicable).    

## 2018-10-03 NOTE — Progress Notes (Signed)
OFFICE NOTE  Chief Complaint:  Routine follow-up  Primary Care Physician: Asencion Noble, MD  HPI:  Eileen Green is a 68 y.o. female patient referred by Dr. Willey Blade for evaluation of chest pain. She reports she has an episode of chest pain about every month. It might be related to stress. 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 She does have a family history of heart disease with her mother who had an MI in her 12s and her father who had chronic kidney disease and was on dialysis. She denies any worsening shortness of breath or increased fatigue or decreased exercise tolerance. She does have additional risk factors including hypertension and dyslipidemia which are well controlled. Her recent LDL cholesterol was 108 however she is on a low-dose of a low potency statin. She does not have a history of any side effects on that.  02/08/2016  Eileen Green underwent a stress echocardiogram which was negative for ischemia. She had good exercise tolerance despite having had an upper respiratory infection about a week prior to that. She says she was able to tolerate exercise without any significant shortness of breath which is a good sign. Oxygen saturation today however was only 95% and she says she's recently had some cough. Blood pressure is at goal. I felt like her chest pain is noncardiac and seem somewhat atypical. She does have cardio Gerald Stabs factors and a family history of premature coronary disease and I agree with aggressive primary prevention.   10/03/2017  Eileen Green returns today for follow-up.  She was seen this past summer by Jory Sims, DNP, and has been asymptomatic.  She did report that she was having some cramping in her calves.  Was thought this might be due to her pravastatin.  The dose was decreased from 40 to 20 mg and she was placed on ezetimibe.  Repeat lipid profile testing was performed 11 days ago which demonstrated a nice improvement in her  lipid profile total cholesterol 148, triglycerides 92, HDL 46 and LDL 84.  She reports she still has some possible statin side effects.  10/03/2018  Eileen Green returns today for follow-up.  Overall she says she is feeling fairly well.  She is having some issues with possible anxiety.  She is feeling jittery when she does some of her classes to [employer].  She had possible side effects and pravastatin and ultimately was changed over to rosuvastatin 5 mg once weekly.  She also takes ezetimibe.  Repeat labs were performed recently which showed total cholesterol 166, triglycerides 70, HDL 56 and LDL of 97.  This is her target LDL less than 100.  Blood pressures well controlled today 130/84.  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  PMHx:  Past Medical History:  Diagnosis Date  . Arthritis   . Cancer ([place])    skin melanoma under left eye  . Deaf    LEFT EAR  . GERD (gastroesophageal reflux disease)   . Hyperlipidemia Deaf in Left ear    Past Surgical History:  Procedure Laterality Date  . ABDOMINAL HYSTERECTOMY  2001   Partial  . COLONOSCOPY  01/21/2003   [id number] rectum/ Long capacious colon, moderate prep made the exam more challenging.  Marland Kitchen COLONOSCOPY  05/18/2009   Dr. Gala Romney: Normal rectum/narrow mouth left sided diverticula   . COLONOSCOPY N/A 05/14/2013   Dr. Gala Romney: hemorrhoids, colonic diverticula, redudant colon  .  ESOPHAGOGASTRODUODENOSCOPY  01/21/2003   RMR:  Normal esophagus, stomach, D1 and D2  . ESOPHAGOGASTRODUODENOSCOPY N/A 03/24/2014   Dr. Gala Romney :Hiatal hernia; otherwise negative EGD  . TONSILLECTOMY      FAMHx:  Family History  Problem Relation Age of Onset  . Heart disease Mother   . Dementia Mother   . Heart disease Father   . Kidney disease Father   . Thyroid nodules Father   . Colon cancer Neg Hx     SOCHx:   reports that she has never smoked. She has never used smokeless tobacco. She reports that she does not  drink alcohol or use drugs.  ALLERGIES:  No Known Allergies  ROS: Pertinent items noted in HPI and remainder of comprehensive ROS otherwise negative.  HOME MEDS: Current Outpatient Medications on File Prior to Visit  Medication Sig Dispense Refill  . Carboxymethylcellulose Sodium (THERATEARS OP) Apply to eye as needed.    . ezetimibe (ZETIA) 10 MG tablet Take 1 tablet (10 mg total) by mouth daily. 30 tablet 11  . Glucosamine-Chondroitin (OSTEO BI-FLEX REGULAR STRENGTH PO) Take by mouth.    . Lactobacillus (DIGESTIVE HEALTH PROBIOTIC PO) Take 1 tablet by mouth daily.     . Methylcellulose, Laxative, 500 MG TABS Take 2 tablets by mouth daily.     . Misc Natural Products (OSTEO BI-FLEX TRIPLE STRENGTH PO) Take by mouth daily.      . Multiple Vitamin (MULTIVITAMIN) capsule Take 1 capsule by mouth 4 (four) times a week.     . pantoprazole (PROTONIX) 40 MG tablet TAKE 1 TABLET BY MOUTH EVERY MORNING 90 tablet 3  . polyethylene glycol (MIRALAX / GLYCOLAX) packet Take 17 g by mouth daily.    . rosuvastatin (CRESTOR) 5 MG tablet Take 1 tablet by mouth once weekly. 15 tablet 3  . SYNTHROID 50 MCG tablet Take 1 tablet (50 mcg total) by mouth daily. 30 tablet 11  . valsartan (DIOVAN) 320 MG tablet Take 1 tablet by mouth daily.     No current facility-administered medications on file prior to visit.     LABS/IMAGING: No results found for this or any previous visit (from the past 48 hour(s)). No results found.  WEIGHTS: Wt Readings from Last 3 Encounters:  10/03/18 186 lb (84.4 kg)  09/19/18 183 lb (83 kg)  08/14/18 183 lb (83 kg)    VITALS: BP 130/84   Pulse 72   Temp (!) 96.8 F (36 C) (Temporal)   Ht 5\' 5"  (1.651 m)   Wt 186 lb (84.4 kg)   SpO2 97%   BMI 30.95 kg/m   EXAM: General appearance: alert and no distress Neck: no carotid bruit, no JVD and thyroid not enlarged, symmetric, no tenderness/mass/nodules Lungs: clear to auscultation bilaterally Heart: regular rate and  rhythm Abdomen: soft, non-tender; bowel sounds normal; no masses,  no organomegaly Extremities: extremities normal, atraumatic, no cyanosis or edema Pulses: 2+ and symmetric Skin: Skin color, texture, turgor normal. No rashes or lesions Neurologic: Grossly normal Psych: Pleasant  EKG: Normal sinus rhythm at 72-personally reviewed  ASSESSMENT: 1. Intermittent chest pain - negative stress echo (2018) 2. Hypertension-controlled 3. Dyslipidemia, at goal LDL less than 100 4. Family history of coronary disease 5. Murmur 6. Hypothyroidism  PLAN: 1.   Mrs. Brunot continues to do well.  She is tolerating rosuvastatin and ezetimibe and now is at goal LDL less than 100.  Her blood pressures well controlled.  She denies any chest pain or worsening shortness of breath.  Her main complaint is with arthritis and feeling jittery.  She may want to check with her primary care provider to make sure she is on the appropriate dose of thyroid medication.  Follow-up annually or sooner as necessary.  Pixie Casino, MD, [facility], [place] Director of the [facility] Lipid [facility] &  Card[facility] Diplomate of the American Board of Clinical Lipidology Attending Cardiologist  Direct Dial: [phone]  Fax: [phone]  Website:  [email or web address] 10/03/2018, 11:35 AM

## 2018-10-04 ENCOUNTER — Ambulatory Visit: Payer: Self-pay

## 2018-10-04 ENCOUNTER — Ambulatory Visit (INDEPENDENT_AMBULATORY_CARE_PROVIDER_SITE_OTHER): Payer: Federal, State, Local not specified - PPO | Admitting: Physical Medicine and Rehabilitation

## 2018-10-04 ENCOUNTER — Encounter: Payer: Self-pay | Admitting: Physical Medicine and Rehabilitation

## 2018-10-04 DIAGNOSIS — M25551 Pain in right hip: Secondary | ICD-10-CM | POA: Diagnosis not present

## 2018-10-04 NOTE — Progress Notes (Signed)
 .  Numeric Pain Rating Scale and Functional Assessment Average Pain 5   In the last MONTH (on 0-10 scale) has pain interfered with the following?  1. General activity like being  able to carry out your everyday physical activities such as walking, climbing stairs, carrying groceries, or moving a chair?  Rating(7)   -Dye Allergies.  

## 2018-10-04 NOTE — Progress Notes (Signed)
   Eileen Green - 68 y.o. female MRN [id number]  Date of birth: [birth year]  Office Visit Note: Visit Date: 10/04/2018 PCP: Asencion Noble, MD Referred by: Asencion Noble, MD  Subjective: Chief Complaint  Patient presents with  . Right Hip - Pain   HPI:  Eileen Green is a 68 y.o. female who comes in today At the request of Dr. Joni Fears for diagnostic hopefully therapeutic right hip anesthetic arthrogram.  Patient's been having pain since March of this year with right hip and groin pain worse with putting on socks and getting out of the car.  She is failed conservative care with anti-inflammatories and therapy.  ROS Otherwise per HPI.  Assessment & Plan: Visit Diagnoses: No diagnosis found.  Plan: No additional findings.   Meds & Orders: No orders of the defined types were placed in this encounter.  No orders of the defined types were placed in this encounter.   Follow-up: No follow-ups on file.   Procedures: Large Joint Inj: R hip joint on 10/04/2018 8:52 AM Indications: pain and diagnostic evaluation Details: 22 G needle, anterior approach  Arthrogram: Yes  Medications: 4 mL bupivacaine 0.25 %; 60 mg triamcinolone acetonide 40 MG/ML Outcome: tolerated well, no immediate complications  Arthrogram demonstrated excellent flow of contrast throughout the joint surface without extravasation or obvious defect.  The patient had relief of symptoms during the anesthetic phase of the injection.  Procedure, treatment alternatives, risks and benefits explained, specific risks discussed. Consent was given by the patient. Immediately prior to procedure a time out was called to verify the correct patient, procedure, equipment, support staff and site/side marked as required. Patient was prepped and draped in the usual sterile fashion.      No notes on file   Clinical History: No specialty comments available.     Objective:  VS:  HT:    WT:   BMI:     BP:   HR: bpm  TEMP: ( )   RESP:  Physical Exam  Ortho Exam Imaging: No results found.

## 2018-10-08 MED ORDER — BUPIVACAINE HCL 0.25 % IJ SOLN
4.0000 mL | INTRAMUSCULAR | Status: AC | PRN
  Administered 2018-10-04: 09:00:00 4 mL via INTRA_ARTICULAR

## 2018-10-08 MED ORDER — TRIAMCINOLONE ACETONIDE 40 MG/ML IJ SUSP
60.0000 mg | INTRAMUSCULAR | Status: AC | PRN
  Administered 2018-10-04: 60 mg via INTRA_ARTICULAR

## 2018-12-12 ENCOUNTER — Other Ambulatory Visit: Payer: Self-pay | Admitting: Internal Medicine

## 2018-12-20 ENCOUNTER — Telehealth: Payer: Self-pay | Admitting: *Deleted

## 2018-12-23 NOTE — Telephone Encounter (Signed)
She would need follow up with Dr. Durward Fortes prior to second injection that fast especially if not much relief. Usually do not do multiples unless they are beneficial for 3 to 4 months

## 2018-12-24 NOTE — Telephone Encounter (Signed)
Called pt lvm #1 on home and mobile.

## 2018-12-25 ENCOUNTER — Ambulatory Visit: Payer: Federal, State, Local not specified - PPO | Admitting: Orthopaedic Surgery

## 2018-12-25 ENCOUNTER — Other Ambulatory Visit: Payer: Self-pay

## 2018-12-25 ENCOUNTER — Encounter: Payer: Self-pay | Admitting: Orthopaedic Surgery

## 2018-12-25 VITALS — Ht 65.0 in | Wt 186.0 lb

## 2018-12-25 DIAGNOSIS — M25551 Pain in right hip: Secondary | ICD-10-CM

## 2018-12-25 DIAGNOSIS — M1611 Unilateral primary osteoarthritis, right hip: Secondary | ICD-10-CM | POA: Diagnosis not present

## 2018-12-25 NOTE — Progress Notes (Signed)
Office Visit Note   Patient: Eileen Green           Date of Birth: 17-Jan-1950           MRN: [id number] Visit Date: 12/25/2018              Requested by: Asencion Noble, MD [address] [place],  [place] [address] PCP: Asencion Noble, MD   Assessment & Plan: Visit Diagnoses:  1. Pain in right hip   2. Unilateral primary osteoarthritis, right hip     Plan: Osteoarthritis right hip with significant compromise of her activities and ambulation.  Long discussion again regarding treatment options.  Mrs. Demler would like to go "slowly" and avoid hip replacement if possible.  She would like to try a course of physical therapy at [facility] and another intra-articular cortisone injection with Dr. Ernestina Patches.  We will make the referrals and see her back in 3 months or sooner.  Have discussed hip replacement in some detail.  Talked nearly 30 minutes regarding all of the above 50% of the time in counseling Follow-Up Instructions: Return in about 3 months (around 03/25/2019).   Orders:  Orders Placed This Encounter  Procedures  . Ambulatory referral to Physical Medicine Rehab  . Ambulatory referral to Physical Therapy   No orders of the defined types were placed in this encounter.     Procedures: No procedures performed   Clinical Data: No additional findings.   Subjective: Chief Complaint  Patient presents with  . Right Hip - Pain  Patient presents today for right hip pain. She was last evaluated in September and then referred to Dr.Newton for a right hip injection. She had her right hip injected on 10/04/2018. She said that she does not recall the injection helping. She said that her hip has worsened and now walks with a limp. She is not taking anything for pain. Her pain is located in her groin, and occasionally wraps around to her buttock. Her pain increases with walking.  HPI  Review of Systems   Objective: Vital Signs: Ht 5\' 5"  (1.651 m)   Wt 186 lb (84.4 kg)   BMI 30.95  kg/m   Physical Exam Constitutional:      Appearance: She is well-developed.  Eyes:     Pupils: Pupils are equal, round, and reactive to light.  Pulmonary:     Effort: Pulmonary effort is normal.  Skin:    General: Skin is warm and dry.  Neurological:     Mental Status: She is alert and oriented to person, place, and time.  Psychiatric:        Behavior: Behavior normal.     Ortho Exam awake alert and oriented x3.  Walks with an obvious limp referable to her right hip.  Right lower extremity is externally rotated.  Has significant decrease range of motion right hip I could bring her hip to neutral but no further with internal rotation has about 15 degrees of external rotation at which point she has pain.  Leg lengths appear to be symmetrical.  Neurologically intact  Specialty Comments:  No specialty comments available.  Imaging: No results found.   PMFS History: Patient Active Problem List   Diagnosis Date Noted  . Unilateral primary osteoarthritis, right hip 08/14/2018  . Epigastric discomfort 12/29/2016  . Dyslipidemia 01/13/2016  . Murmur 01/13/2016  . Essential hypertension 01/13/2016  . Hiatal hernia   . Dyspepsia 03/18/2014  . RLQ abdominal pain 08/08/2013  . Multinodular  goiter (nontoxic) 10/06/2010  . HYPOTHYROIDISM 11/05/2008  . Constipation 11/04/2008  . GERD 06/18/2008  . CHEST PAIN 06/18/2008   Past Medical History:  Diagnosis Date  . Arthritis   . Cancer ([place])    skin melanoma under left eye  . Deaf    LEFT EAR  . GERD (gastroesophageal reflux disease)   . Hyperlipidemia Deaf in Left ear    Family History  Problem Relation Age of Onset  . Heart disease Mother   . Dementia Mother   . Heart disease Father   . Kidney disease Father   . Thyroid nodules Father   . Colon cancer Neg Hx     Past Surgical History:  Procedure Laterality Date  . ABDOMINAL HYSTERECTOMY  2001   Partial  . COLONOSCOPY  01/21/2003   [id number] rectum/ Long capacious  colon, moderate prep made the exam more challenging.  Marland Kitchen COLONOSCOPY  05/18/2009   Dr. Gala Romney: Normal rectum/narrow mouth left sided diverticula   . COLONOSCOPY N/A 05/14/2013   Dr. Gala Romney: hemorrhoids, colonic diverticula, redudant colon  . ESOPHAGOGASTRODUODENOSCOPY  01/21/2003   RMR:  Normal esophagus, stomach, D1 and D2  . ESOPHAGOGASTRODUODENOSCOPY N/A 03/24/2014   Dr. Gala Romney :Hiatal hernia; otherwise negative EGD  . TONSILLECTOMY     Social History   Occupational History  . 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 Smoking status: Never Smoker  . Smokeless tobacco: Never Used  . Tobacco comment: Never smoked  Substance and Sexual Activity  . Alcohol use: No  . Drug use: No  . Sexual activity: Not on file

## 2018-12-25 NOTE — Telephone Encounter (Signed)
Pt is scheduled for 12/31/2018 for hip injection.

## 2018-12-30 ENCOUNTER — Other Ambulatory Visit: Payer: Self-pay

## 2018-12-30 ENCOUNTER — Ambulatory Visit (HOSPITAL_COMMUNITY): Payer: Federal, State, Local not specified - PPO | Attending: Orthopaedic Surgery | Admitting: Physical Therapy

## 2018-12-30 ENCOUNTER — Encounter (HOSPITAL_COMMUNITY): Payer: Self-pay | Admitting: Physical Therapy

## 2018-12-30 DIAGNOSIS — M25651 Stiffness of right hip, not elsewhere classified: Secondary | ICD-10-CM

## 2018-12-30 DIAGNOSIS — M6281 Muscle weakness (generalized): Secondary | ICD-10-CM | POA: Insufficient documentation

## 2018-12-30 DIAGNOSIS — R2689 Other abnormalities of gait and mobility: Secondary | ICD-10-CM

## 2018-12-30 DIAGNOSIS — M25551 Pain in right hip: Secondary | ICD-10-CM | POA: Diagnosis present

## 2018-12-30 NOTE — Therapy (Signed)
[place], [place], [address] Phone: [phone]   Fax:  [phone]  Physical Therapy Evaluation  Patient Details  Name: Eileen Green MRN: [id number] Date of Birth: January 16, 1950 Referring Provider (PT): Joni Fears MD   Encounter Date: 12/30/2018  PT End of Session - 12/30/18 1057    Visit Number  1    Number of Visits  12    Date for PT Re-Evaluation  02/10/19    Authorization Type  BCBS/Federal emp PPO (no auth required- 75 visit limit pt/ot/slp combined)    Authorization Time Period  12/30/18-02/10/19    Authorization - Visit Number  1    Authorization - Number of Visits  10    PT Start Time  [phone]    PT Stop Time  1038    PT Time Calculation (min)  51 min    Activity Tolerance  Patient tolerated treatment well    Behavior During Therapy  [facility] for tasks assessed/performed       Past Medical History:  Diagnosis Date  . Arthritis   . Cancer ([place])    skin melanoma under left eye  . Deaf    LEFT EAR  . GERD (gastroesophageal reflux disease)   . Hyperlipidemia Deaf in Left ear    Past Surgical History:  Procedure Laterality Date  . ABDOMINAL HYSTERECTOMY  2001   Partial  . COLONOSCOPY  01/21/2003   [id number] rectum/ Long capacious colon, moderate prep made the exam more challenging.  Marland Kitchen COLONOSCOPY  05/18/2009   Dr. Gala Romney: Normal rectum/narrow mouth left sided diverticula   . COLONOSCOPY N/A 05/14/2013   Dr. Gala Romney: hemorrhoids, colonic diverticula, redudant colon  . ESOPHAGOGASTRODUODENOSCOPY  01/21/2003   RMR:  Normal esophagus, stomach, D1 and D2  . ESOPHAGOGASTRODUODENOSCOPY N/A 03/24/2014   Dr. Gala Romney :Hiatal hernia; otherwise negative EGD  . TONSILLECTOMY      There were no vitals filed for this visit.   Subjective Assessment - 12/30/18 0950    Subjective  Patient is a 68 y.o. female who presents to physical therapy with c/o hip pain beginning about 6 months. She is having difficulty with  walking, bending her hip, drying her leg after showering, sleeping, and stairs. She has a lot of stiffness after resting for extended periods of time. She has difficulty transferring from sit to standing, occasional "giving way" when walking, and limping with walking. She has been trying to move as much as she is able. Pain is worse with standing and performing chores. She notices decrease in symptoms with medication. Her main goal for therapy is to be able to put her sock on without pain and not limp with walking.    Pertinent History  low back pain    How long can you sit comfortably?  unrestricted    How long can you stand comfortably?  10 minutes    How long can you walk comfortably?  5 minutes    Patient Stated Goals  to be able to put her sock on without pain and not limp with walking.    Currently in Pain?  Yes    Pain Score  3    worst 4.5/10   Pain Location  Hip    Pain Orientation  Right    Pain Descriptors / Indicators  Aching;Numbness    Pain Onset  More than a month ago         [facility] PT Assessment - 12/30/18 0001  Assessment   Medical Diagnosis  R Hip Pain    Referring Provider (PT)  Joni Fears MD    Onset Date/Surgical Date  06/30/18    Next MD Visit  none scheduled    Prior Therapy  none      Precautions   Precautions  None      Restrictions   Weight Bearing Restrictions  No      Balance Screen   Has the patient fallen in the past 6 months  No    Has the patient had a decrease in activity level because of a fear of falling?   No    Is the patient reluctant to leave their home because of a fear of falling?   No      Prior Function   Level of Independence  Independent      Cognition   Overall Cognitive Status  Within Functional Limits for tasks assessed      Observation/Other Assessments   Observations  Patient ambulates without AD, moderate antalgic gait, decreased hip extension during toe R toe off, decreased stance time on RLE, trendelenberg RLE     Focus on Therapeutic Outcomes (FOTO)   43% limited      ROM / Strength   AROM / PROM / Strength  AROM;PROM;Strength      AROM   Overall AROM   Deficits;Due to pain    Overall AROM Comments  decreased R hip ROM with pain in all planes    AROM Assessment Site  Hip    Right/Left Hip  Right;Left    Right Hip Flexion  90    Right Hip External Rotation   30    Right Hip Internal Rotation   10   in seated     PROM   Overall PROM   Deficits;Due to pain    PROM Assessment Site  Hip    Right/Left Hip  Right    Right Hip Flexion  92    Right Hip External Rotation   35    Right Hip Internal Rotation   --   lacking 10 in supine with hip at 90 degrees flexion     Strength   Strength Assessment Site  Hip;Knee;Ankle    Right/Left Hip  Right;Left    Right Hip Flexion  3+/5    Right Hip Extension  3+/5    Right Hip ABduction  4-/5    Left Hip Flexion  4+/5    Left Hip Extension  4/5    Left Hip ABduction  4/5    Right/Left Knee  Right;Left    Right Knee Flexion  5/5    Right Knee Extension  5/5    Left Knee Flexion  5/5    Left Knee Extension  5/5    Right/Left Ankle  Right;Left    Right Ankle Dorsiflexion  5/5    Left Ankle Dorsiflexion  5/5      Palpation   Palpation comment  TTP in R glutes      Transfers   Comments  Uses hands for sit to stand transfers      Ambulation/Gait   Ambulation/Gait  Yes    Ambulation/Gait Assistance  7: Independent    Ambulation Distance (Feet)  500 Feet    Gait Pattern  Step-through pattern;Decreased stance time - right;Trendelenburg;Antalgic   decreased hip extension in stance to toe off, RLE is ER   Ambulation Surface  Level;Indoor    Gait Comments  2 MWT  Objective measurements completed on examination: See above findings.              PT Education - 12/30/18 1056    Education Details  Patient educated on exam findings, POC, hip pathology, scope of PT    Person(s) Educated  Patient    Methods   Explanation    Comprehension  Verbalized understanding       PT Short Term Goals - 12/30/18 1107      PT SHORT TERM GOAL #1   Title  Patient will be independent with HEP in order to optimize functional outcomes.    Time  3    Period  Weeks    Status  New    Target Date  01/20/19      PT SHORT TERM GOAL #2   Title  Patient will report 25% improvement in symptoms for improved function with ADL.    Time  3    Period  Weeks    Status  New    Target Date  01/20/19        PT Long Term Goals - 12/30/18 1107      PT LONG TERM GOAL #1   Title  Patient will report at least 75% improvement in symptoms in order for improved quality of life.    Time  6    Period  Weeks    Status  New    Target Date  02/10/19      PT LONG TERM GOAL #2   Title  Patient will improve FOTO score by at least 10 points in order to demonstrate improved activity tolerance.    Time  6    Period  Weeks    Status  New    Target Date  02/10/19      PT LONG TERM GOAL #3   Title  Patient will be able to ambulate at least 450 feet during 2 MWT with improved gait pattern with minimally antalgic gait and improved hip extension ROM for ambulating in the community.    Time  6    Period  Weeks    Status  New    Target Date  02/10/19             Plan - 12/30/18 1102    Clinical Impression Statement  Patient is a 68 y.o. female who presents to physical therapy with c/o hip pain. She presents with pain limited deficits in hip strength, ROM, endurance, postural impairments, gait, and functional mobility with ADL. She is having to modify and restrict ADL as indicated by FOTO score as well as subjective information and objective measures which is affecting overall participation. Patient will benefit from skilled physical therapy in order to improve function and reduce impairment.    Personal Factors and Comorbidities  Age;Past/Current Experience    Examination-Activity Limitations  Bathing;Bed Mobility;Bend;Caring  for Others;Carry;Dressing;Locomotion Level;Squat;Stairs;Stand;Transfers    Examination-Participation Restrictions  Church;Cleaning;Community Activity;Driving;Laundry;Meal Prep;Shop;Volunteer;Yard Work    Stability/Clinical Decision Making  Stable/Uncomplicated    Designer, jewellery  Low    Rehab Potential  Good    PT Frequency  2x / week    PT Duration  6 weeks    PT Treatment/Interventions  ADLs/Self Care Home Management;Aquatic Therapy;Biofeedback;Electrical Stimulation;Cryotherapy;Iontophoresis 4mg /ml Dexamethasone;Moist Heat;Traction;Ultrasound;DME Instruction;Gait training;Stair training;Functional mobility training;Therapeutic activities;Therapeutic exercise;Balance training;Neuromuscular re-education;Patient/family education;Orthotic Fit/Training;Manual techniques;Compression bandaging;Passive range of motion;Dry needling;Energy conservation;Splinting;Taping;Vasopneumatic Device;Spinal Manipulations;Joint Manipulations    PT Next Visit Plan  begin hip mobility exercises, begin hip and core strengthening and progress as  tolerated    PT Home Exercise Plan  initiate next session    Consulted and Agree with Plan of Care  Patient       Patient will benefit from skilled therapeutic intervention in order to improve the following deficits and impairments:  Abnormal gait, Decreased activity tolerance, Decreased balance, Decreased endurance, Decreased mobility, Decreased range of motion, Decreased strength, Difficulty walking, Increased muscle spasms, Impaired flexibility, Impaired tone, Improper body mechanics, Pain  Visit Diagnosis: Pain in right hip  Other abnormalities of gait and mobility  Muscle weakness (generalized)  Stiffness of right hip, not elsewhere classified     Problem List Patient Active Problem List   Diagnosis Date Noted  . Unilateral primary osteoarthritis, right hip 08/14/2018  . Epigastric discomfort 12/29/2016  . Dyslipidemia 01/13/2016  . Murmur 01/13/2016   . Essential hypertension 01/13/2016  . Hiatal hernia   . Dyspepsia 03/18/2014  . RLQ abdominal pain 08/08/2013  . 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 HYPOTHYROIDISM 11/05/2008  . Constipation 11/04/2008  . GERD 06/18/2008  . CHEST PAIN 06/18/2008    12:51 PM, 12/30/18 Mearl Latin PT, DPT Physical Therapist at [place], [place], [address] Phone: [phone]   Fax:  [phone]  Name: Eileen Green MRN: [id number] Date of Birth: 07-28-1950

## 2018-12-31 ENCOUNTER — Ambulatory Visit (INDEPENDENT_AMBULATORY_CARE_PROVIDER_SITE_OTHER): Payer: Federal, State, Local not specified - PPO | Admitting: Physical Medicine and Rehabilitation

## 2018-12-31 ENCOUNTER — Encounter: Payer: Self-pay | Admitting: Physical Medicine and Rehabilitation

## 2018-12-31 ENCOUNTER — Ambulatory Visit: Payer: Self-pay

## 2018-12-31 DIAGNOSIS — M25551 Pain in right hip: Secondary | ICD-10-CM | POA: Diagnosis not present

## 2018-12-31 NOTE — Progress Notes (Signed)
.  Numeric Pain Rating Scale and Functional Assessment Average Pain 4   In the last MONTH (on 0-10 scale) has pain interfered with the following?  1. General activity like being  able to carry out your everyday physical activities such as walking, climbing stairs, carrying groceries, or moving a chair?  Rating(5)    -Dye Allergies.

## 2018-12-31 NOTE — Progress Notes (Signed)
   Eileen Green - 68 y.o. female MRN [id number]  Date of birth: May 09, 1950  Office Visit Note: Visit Date: 12/31/2018 PCP: Asencion Noble, MD Referred by: Asencion Noble, MD  Subjective: Chief Complaint  Patient presents with  . Right Hip - Pain   HPI:  Eileen Green is a 68 y.o. female who comes in today At the request of Dr. Joni Fears for diagnostic hopefully therapeutic anesthetic hip arthrogram on the right.  Patient does have right hip and groin pain with ambulation and movement.  She has had no recent trauma or radicular pain.  She does have osteoarthritis of the right hip.  She is asking a lot of questions today about total hip replacement which we tried her best to answer those questions.  I have asked her to follow-up with Dr. Durward Fortes per his request in 2 to 3 months.  She can asked those questions of him.  She is undergoing physical therapy now for her hip as well.  ROS Otherwise per HPI.  Assessment & Plan: Visit Diagnoses:  1. Pain in right hip     Plan: Findings:  Patient did have some increased range of motion and decreased pain during the anesthetic phase of the injection.    Meds & Orders: No orders of the defined types were placed in this encounter.   Orders Placed This Encounter  Procedures  . Large Joint Inj: R hip joint  . C-ARM NO Order    Follow-up: Return in about 3 months (around 03/31/2019) for Joni Fears, MD.   Procedures: Large Joint Inj: R hip joint on 12/31/2018 1:47 PM Indications: pain and diagnostic evaluation Details: 22 G needle, anterior approach  Arthrogram: Yes  Medications: 4 mL bupivacaine 0.25 %; 40 mg triamcinolone acetonide 40 MG/ML Outcome: tolerated well, no immediate complications  Arthrogram demonstrated excellent flow of contrast throughout the joint surface without extravasation or obvious defect.  The patient had relief of symptoms during the anesthetic phase of the injection.  Procedure, treatment alternatives, risks  and benefits explained, specific risks discussed. Consent was given by the patient. Immediately prior to procedure a time out was called to verify the correct patient, procedure, equipment, support staff and site/side marked as required. Patient was prepped and draped in the usual sterile fashion.      No notes on file   Clinical History: No specialty comments available.     Objective:  VS:  HT:    WT:   BMI:     BP:   HR: bpm  TEMP: ( )  RESP:  Physical Exam  Ortho Exam Imaging: C-ARM NO Order  Result Date: 12/31/2018 Please see Notes tab for imaging impression.

## 2019-01-01 ENCOUNTER — Ambulatory Visit (HOSPITAL_COMMUNITY): Payer: Federal, State, Local not specified - PPO | Admitting: Physical Therapy

## 2019-01-01 ENCOUNTER — Other Ambulatory Visit: Payer: Self-pay

## 2019-01-01 ENCOUNTER — Encounter (HOSPITAL_COMMUNITY): Payer: Self-pay | Admitting: Physical Therapy

## 2019-01-01 DIAGNOSIS — R2689 Other abnormalities of gait and mobility: Secondary | ICD-10-CM

## 2019-01-01 DIAGNOSIS — M25551 Pain in right hip: Secondary | ICD-10-CM | POA: Diagnosis not present

## 2019-01-01 DIAGNOSIS — M6281 Muscle weakness (generalized): Secondary | ICD-10-CM

## 2019-01-01 DIAGNOSIS — M25651 Stiffness of right hip, not elsewhere classified: Secondary | ICD-10-CM

## 2019-01-01 MED ORDER — BUPIVACAINE HCL 0.25 % IJ SOLN
4.0000 mL | INTRAMUSCULAR | Status: AC | PRN
  Administered 2018-12-31: 14:00:00 4 mL via INTRA_ARTICULAR

## 2019-01-01 MED ORDER — TRIAMCINOLONE ACETONIDE 40 MG/ML IJ SUSP
40.0000 mg | INTRAMUSCULAR | Status: AC | PRN
  Administered 2018-12-31: 40 mg via INTRA_ARTICULAR

## 2019-01-01 NOTE — Patient Instructions (Signed)
Access Code: [email or web address]  URL: [email or web address]  Date: 01/01/2019  Prepared by: [employer] Princella Jaskiewicz   Exercises Supine Bridge - 10 reps - 2 sets - 1x daily - 7x weekly Supine March - 10 reps - 2 sets - 1x daily - 7x weekly

## 2019-01-01 NOTE — Therapy (Signed)
[place], [place], [address] Phone: [phone]   Fax:  [phone]  Physical Therapy Treatment  Patient Details  Name: Eileen Green MRN: [id number] Date of Birth: 1950/06/28 Referring Provider (PT): Joni Fears MD   Encounter Date: 01/01/2019  PT End of Session - 01/01/19 1305    Visit Number  2    Number of Visits  12    Date for PT Re-Evaluation  02/10/19    Authorization Type  BCBS/Federal emp PPO (no auth required- 75 visit limit pt/ot/slp combined)    Authorization Time Period  12/30/18-02/10/19    Authorization - Visit Number  2    Authorization - Number of Visits  10    PT Start Time  0945    PT Stop Time  1030    PT Time Calculation (min)  45 min    Activity Tolerance  Patient tolerated treatment well    Behavior During Therapy  [facility] for tasks assessed/performed       Past Medical History:  Diagnosis Date  . Arthritis   . Cancer ([place])    skin melanoma under left eye  . Deaf    LEFT EAR  . GERD (gastroesophageal reflux disease)   . Hyperlipidemia Deaf in Left ear    Past Surgical History:  Procedure Laterality Date  . ABDOMINAL HYSTERECTOMY  2001   Partial  . COLONOSCOPY  01/21/2003   [id number] rectum/ Long capacious colon, moderate prep made the exam more challenging.  Marland Kitchen COLONOSCOPY  05/18/2009   Dr. Gala Romney: Normal rectum/narrow mouth left sided diverticula   . COLONOSCOPY N/A 05/14/2013   Dr. Gala Romney: hemorrhoids, colonic diverticula, redudant colon  . ESOPHAGOGASTRODUODENOSCOPY  01/21/2003   RMR:  Normal esophagus, stomach, D1 and D2  . ESOPHAGOGASTRODUODENOSCOPY N/A 03/24/2014   Dr. Gala Romney :Hiatal hernia; otherwise negative EGD  . TONSILLECTOMY      There were no vitals filed for this visit.  Subjective Assessment - 01/01/19 0945    Subjective  Patient states she is feeling better after her injection yesterday. She thought the numbness from her injection was supposed to wear off  last night. She is concerned about the long term if she will need a hip replacement. She has questions about hip replacements, anterior vs. posterior approach, and hip surgeons.    Pertinent History  low back pain    How long can you sit comfortably?  unrestricted    How long can you stand comfortably?  10 minutes    How long can you walk comfortably?  5 minutes    Patient Stated Goals  to be able to put her sock on without pain and not limp with walking.    Currently in Pain?  No/denies    Pain Onset  More than a month ago                       [facility] : [facility] Adult PT Treatment/Exercise - 01/01/19 0001      Ambulation/Gait   Ambulation/Gait  Yes    Ambulation/Gait Assistance  7: Independent    Ambulation Distance (Feet)  100 Feet    Gait Pattern  Step-through pattern;Decreased stance time - right;Trendelenburg;Antalgic   decreased hip extension in stance to toe off, RLE is ER   Ambulation Surface  Level;Indoor    Gait Comments  minimal improvement following manual therapy       Exercises   Exercises  Knee/Hip  Knee/Hip Exercises: Supine   Bridges  2 sets;10 reps;AROM    Other Supine Knee/Hip Exercises  supine marches 2 x10       Manual Therapy   Manual Therapy  Soft tissue mobilization    Manual therapy comments  completed separately from all other aspects of treatment    Soft tissue mobilization  R hip flexor in hooklying             PT Education - 01/01/19 1303    Education Details  Patient educated on HEP, scope of PT, hip replacements, her POC and rehab, and potential outcomes.    Person(s) Educated  Patient    Methods  Explanation;Handout    Comprehension  Verbalized understanding       PT Short Term Goals - 01/01/19 1316      PT SHORT TERM GOAL #1   Title  Patient will be independent with HEP in order to optimize functional outcomes.    Time  3    Period  Weeks    Status  On-going    Target Date  01/20/19      PT SHORT TERM GOAL #2   Title   Patient will report 25% improvement in symptoms for improved function with ADL.    Time  3    Period  Weeks    Status  On-going    Target Date  01/20/19        PT Long Term Goals - 01/01/19 1316      PT LONG TERM GOAL #1   Title  Patient will report at least 75% improvement in symptoms in order for improved quality of life.    Time  6    Period  Weeks    Status  On-going      PT LONG TERM GOAL #2   Title  Patient will improve FOTO score by at least 10 points in order to demonstrate improved activity tolerance.    Time  6    Period  Weeks    Status  On-going      PT LONG TERM GOAL #3   Title  Patient will be able to ambulate at least 450 feet during 2 MWT with improved gait pattern with minimally antalgic gait and improved hip extension ROM for ambulating in the community.    Time  6    Period  Weeks    Status  On-going            Plan - 01/01/19 1306    Clinical Impression Statement  Patient has questions about potential outcomes for her hip as well as hip replacements and she is educated on the topics. She experiences hip pain with end range extension during bridge but pain is reduced following verbal cueing and demonstration to limit motion and intensity of muscle contraction. She has a hyperactive R hip flexor which is tender to palpation and is concordant to patient's symptoms. There is a decrease in tissue tension following STM which decreases symptoms minimally. She requires frequent verbal cueing for mechanics and sequencing of supine marching. Patient will continue to benefit from skilled physical therapy in order to reduce symptoms and improve function.    Personal Factors and Comorbidities  Age;Past/Current Experience    Examination-Activity Limitations  Bathing;Bed Mobility;Bend;Caring for Others;Carry;Dressing;Locomotion Level;Squat;Stairs;Stand;Transfers    Examination-Participation Restrictions  Church;Cleaning;Community Activity;Driving;Laundry;Meal  Prep;Shop;Volunteer;Yard Work    Stability/Clinical Decision Making  Stable/Uncomplicated    Rehab Potential  Good    PT Frequency  2x / week  PT Duration  6 weeks    PT Treatment/Interventions  ADLs/Self Care Home Management;Aquatic Therapy;Biofeedback;Electrical Stimulation;Cryotherapy;Iontophoresis 4mg /ml Dexamethasone;Moist Heat;Traction;Ultrasound;DME Instruction;Gait training;Stair training;Functional mobility training;Therapeutic activities;Therapeutic exercise;Balance training;Neuromuscular re-education;Patient/family education;Orthotic Fit/Training;Manual techniques;Compression bandaging;Passive range of motion;Dry needling;Energy conservation;Splinting;Taping;Vasopneumatic Device;Spinal Manipulations;Joint Manipulations    PT Next Visit Plan  continue hip mobility exercises, continue hip and core strengthening and progress as tolerated; ask abuse and advanced directives sections    PT Home Exercise Plan  01/01/19 bridges 2x10, supine marches 2x10    Consulted and Agree with Plan of Care  Patient       Patient will benefit from skilled therapeutic intervention in order to improve the following deficits and impairments:  Abnormal gait, Decreased activity tolerance, Decreased balance, Decreased endurance, Decreased mobility, Decreased range of motion, Decreased strength, Difficulty walking, Increased muscle spasms, Impaired flexibility, Impaired tone, Improper body mechanics, Pain  Visit Diagnosis: Pain in right hip  Other abnormalities of gait and mobility  Muscle weakness (generalized)  Stiffness of right hip, not elsewhere classified     Problem List Patient Active Problem List   Diagnosis Date Noted  . Unilateral primary osteoarthritis, right hip 08/14/2018  . Epigastric discomfort 12/29/2016  . Dyslipidemia 01/13/2016  . Murmur 01/13/2016  . Essential hypertension 01/13/2016  . Hiatal hernia   . Dyspepsia 03/18/2014  . RLQ abdominal pain 08/08/2013  . Multinodular  goiter (nontoxic) 10/06/2010  . HYPOTHYROIDISM 11/05/2008  . Constipation 11/04/2008  . GERD 06/18/2008  . CHEST PAIN 06/18/2008    1:20 PM, 01/01/19 Mearl Latin PT, DPT Physical Therapist at [place], [place], [address] Phone: [phone]   Fax:  [phone]  Name: Eileen Green MRN: [id number] Date of Birth: 1950/05/09

## 2019-01-06 ENCOUNTER — Encounter (HOSPITAL_COMMUNITY): Payer: Self-pay | Admitting: Physical Therapy

## 2019-01-06 ENCOUNTER — Ambulatory Visit (HOSPITAL_COMMUNITY): Payer: Federal, State, Local not specified - PPO | Attending: Orthopaedic Surgery | Admitting: Physical Therapy

## 2019-01-06 ENCOUNTER — Other Ambulatory Visit: Payer: Self-pay

## 2019-01-06 DIAGNOSIS — M25651 Stiffness of right hip, not elsewhere classified: Secondary | ICD-10-CM | POA: Diagnosis present

## 2019-01-06 DIAGNOSIS — M6281 Muscle weakness (generalized): Secondary | ICD-10-CM

## 2019-01-06 DIAGNOSIS — R2689 Other abnormalities of gait and mobility: Secondary | ICD-10-CM | POA: Insufficient documentation

## 2019-01-06 DIAGNOSIS — M25551 Pain in right hip: Secondary | ICD-10-CM

## 2019-01-06 NOTE — Patient Instructions (Signed)
Access Code: [email or web address]  URL: [email or web address]  Date: 01/06/2019  Prepared by: Mitzi Hansen Dariush Mcnellis   Exercises Sit to Stand - 10 reps - 2 sets - 1x daily - 7x weekly

## 2019-01-06 NOTE — Therapy (Signed)
[facility] [place], [place], [address] Phone: [phone]   Fax:  [phone]  Physical Therapy Treatment  Patient Details  Name: Eileen Green MRN: [id number] Date of Birth: 10-04-1950 Referring Provider (PT): Joni Fears MD   Encounter Date: 01/06/2019  PT End of Session - 01/06/19 1143    Visit Number  3    Number of Visits  12    Date for PT Re-Evaluation  02/10/19    Authorization Type  BCBS/Federal emp PPO (no auth required- 75 visit limit pt/ot/slp combined)    Authorization Time Period  12/30/18-02/10/19    Authorization - Visit Number  3    Authorization - Number of Visits  10    PT Start Time  1038    PT Stop Time  1127    PT Time Calculation (min)  49 min    Activity Tolerance  Patient tolerated treatment well    Behavior During Therapy  [facility] for tasks assessed/performed       Past Medical History:  Diagnosis Date  . Arthritis   . Cancer ([place])    skin melanoma under left eye  . Deaf    LEFT EAR  . GERD (gastroesophageal reflux disease)   . Hyperlipidemia Deaf in Left ear    Past Surgical History:  Procedure Laterality Date  . ABDOMINAL HYSTERECTOMY  2001   Partial  . COLONOSCOPY  01/21/2003   [id number] rectum/ Long capacious colon, moderate prep made the exam more challenging.  Marland Kitchen COLONOSCOPY  05/18/2009   Dr. Gala Romney: Normal rectum/narrow mouth left sided diverticula   . COLONOSCOPY N/A 05/14/2013   Dr. Gala Romney: hemorrhoids, colonic diverticula, redudant colon  . ESOPHAGOGASTRODUODENOSCOPY  01/21/2003   RMR:  Normal esophagus, stomach, D1 and D2  . ESOPHAGOGASTRODUODENOSCOPY N/A 03/24/2014   Dr. Gala Romney :Hiatal hernia; otherwise negative EGD  . TONSILLECTOMY      There were no vitals filed for this visit.  Subjective Assessment - 01/06/19 1041    Subjective  Patient reports she has been trying not to limp. She feels so much better since 2 weeks ago. It has been easier to get her socks on but it's  still difficult. She continues to modify how she does things do to pain. She has been getting up out of chair easier and walking without as long to get moving. She felt fine after last session, no soreness. She has been able to perform HEP 3x but she hasn't found a good place to do them.    Pertinent History  low back pain    How long can you sit comfortably?  unrestricted    How long can you stand comfortably?  10 minutes    How long can you walk comfortably?  5 minutes    Patient Stated Goals  to be able to put her sock on without pain and not limp with walking.    Currently in Pain?  No/denies    Pain Onset  More than a month ago                       [facility] Adult PT Treatment/Exercise - 01/06/19 0001      Ambulation/Gait   Ambulation/Gait  Yes    Ambulation/Gait Assistance  7: Independent    Ambulation Distance (Feet)  100 Feet    Gait Pattern  Step-through pattern;Decreased stance time - right;Trendelenburg;Antalgic   decreased hip extension in stance to toe off, RLE is ER  Ambulation Surface  Level;Indoor    Gait Comments  Patient ambulates with decreased antalgic gait when being aware of gait mechanics      Knee/Hip Exercises: Standing   Other Standing Knee Exercises  standing glute contraction with verbal cueing for corkscrewing LE 2x10      Knee/Hip Exercises: Supine   Bridges  2 sets;10 reps;AROM    Other Supine Knee/Hip Exercises  supine marches 2 x10       Manual Therapy   Manual Therapy  Soft tissue mobilization    Manual therapy comments  completed separately from all other aspects of treatment    Soft tissue mobilization  R hip flexor in hooklying             PT Education - 01/06/19 1141    Education Details  Patient educated on HEP, anterior vs. posterior hip replacements, review of HEP, hip anatomy    Person(s) Educated  Patient    Methods  Explanation;Handout    Comprehension  Verbalized understanding       PT Short Term Goals -  01/01/19 1316      PT SHORT TERM GOAL #1   Title  Patient will be independent with HEP in order to optimize functional outcomes.    Time  3    Period  Weeks    Status  On-going    Target Date  01/20/19      PT SHORT TERM GOAL #2   Title  Patient will report 25% improvement in symptoms for improved function with ADL.    Time  3    Period  Weeks    Status  On-going    Target Date  01/20/19        PT Long Term Goals - 01/01/19 1316      PT LONG TERM GOAL #1   Title  Patient will report at least 75% improvement in symptoms in order for improved quality of life.    Time  6    Period  Weeks    Status  On-going      PT LONG TERM GOAL #2   Title  Patient will improve FOTO score by at least 10 points in order to demonstrate improved activity tolerance.    Time  6    Period  Weeks    Status  On-going      PT LONG TERM GOAL #3   Title  Patient will be able to ambulate at least 450 feet during 2 MWT with improved gait pattern with minimally antalgic gait and improved hip extension ROM for ambulating in the community.    Time  6    Period  Weeks    Status  On-going            Plan - 01/06/19 1147    Clinical Impression Statement  Patient has difficulty with activating glutes in standing and hook lying despite frequent verbal and tactile cues to perform. She is able to activate in standing with cueing to corkscrew her feet into the ground. Patient is limited with STS due to L knee pain but is able to complete after elevating LLE with towel for emphasis on RLE to complete. She tends to weight shift off RLE during decent of STS. She continues to be tender to palpation of R hip flexors and they are overactive which is likely contributing to anterior hip symptoms. Tension decreases minimally following manual therapy. Patient educated on different approaches of hip replacements and risks/benefits of the procedure.  Patient will continue to benefit from skilled physical therapy in order to  improve function and reduce impairment.    Personal Factors and Comorbidities  Age;Past/Current Experience    Examination-Activity Limitations  Bathing;Bed Mobility;Bend;Caring for Others;Carry;Dressing;Locomotion Level;Squat;Stairs;Stand;Transfers    Examination-Participation Restrictions  Church;Cleaning;Community Activity;Driving;Laundry;Meal Prep;Shop;Volunteer;Yard Work    Stability/Clinical Decision Making  Stable/Uncomplicated    Rehab Potential  Good    PT Frequency  2x / week    PT Duration  6 weeks    PT Treatment/Interventions  ADLs/Self Care Home Management;Aquatic Therapy;Biofeedback;Electrical Stimulation;Cryotherapy;Iontophoresis 4mg /ml Dexamethasone;Moist Heat;Traction;Ultrasound;DME Instruction;Gait training;Stair training;Functional mobility training;Therapeutic activities;Therapeutic exercise;Balance training;Neuromuscular re-education;Patient/family education;Orthotic Fit/Training;Manual techniques;Compression bandaging;Passive range of motion;Dry needling;Energy conservation;Splinting;Taping;Vasopneumatic Device;Spinal Manipulations;Joint Manipulations    PT Next Visit Plan  continue hip mobility exercises, continue hip and core strengthening and progress as tolerated    PT Home Exercise Plan  01/01/19 bridges 2x10, supine marches 2x10 01/06/19 sit to stand    Consulted and Agree with Plan of Care  Patient       Patient will benefit from skilled therapeutic intervention in order to improve the following deficits and impairments:  Abnormal gait, Decreased activity tolerance, Decreased balance, Decreased endurance, Decreased mobility, Decreased range of motion, Decreased strength, Difficulty walking, Increased muscle spasms, Impaired flexibility, Impaired tone, Improper body mechanics, Pain  Visit Diagnosis: Pain in right hip  Other abnormalities of gait and mobility  Muscle weakness (generalized)  Stiffness of right hip, not elsewhere classified     Problem  List Patient Active Problem List   Diagnosis Date Noted  . Unilateral primary osteoarthritis, right hip 08/14/2018  . Epigastric discomfort 12/29/2016  . Dyslipidemia 01/13/2016  . Murmur 01/13/2016  . Essential hypertension 01/13/2016  . Hiatal hernia   . Dyspepsia 03/18/2014  . RLQ abdominal pain 08/08/2013  . 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 HYPOTHYROIDISM 11/05/2008  . Constipation 11/04/2008  . GERD 06/18/2008  . CHEST PAIN 06/18/2008    11:55 AM, 01/06/19 Mearl Latin PT, DPT Physical Therapist at [facility] [place], [place], [address] Phone: [phone]   Fax:  [phone]  Name: Eileen Green MRN: [id number] Date of Birth: July 11, 1950

## 2019-01-08 ENCOUNTER — Ambulatory Visit (HOSPITAL_COMMUNITY): Payer: Federal, State, Local not specified - PPO | Admitting: Physical Therapy

## 2019-01-08 ENCOUNTER — Other Ambulatory Visit: Payer: Self-pay

## 2019-01-08 ENCOUNTER — Encounter (HOSPITAL_COMMUNITY): Payer: Self-pay | Admitting: Physical Therapy

## 2019-01-08 DIAGNOSIS — M6281 Muscle weakness (generalized): Secondary | ICD-10-CM

## 2019-01-08 DIAGNOSIS — M25551 Pain in right hip: Secondary | ICD-10-CM | POA: Diagnosis not present

## 2019-01-08 DIAGNOSIS — M25651 Stiffness of right hip, not elsewhere classified: Secondary | ICD-10-CM

## 2019-01-08 DIAGNOSIS — R2689 Other abnormalities of gait and mobility: Secondary | ICD-10-CM

## 2019-01-08 NOTE — Therapy (Signed)
[facility] [place], [place], [address] Phone: [phone]   Fax:  [phone]  Physical Therapy Treatment  Patient Details  Name: Eileen Green MRN: [id number] Date of Birth: 02-19-1950 Referring Provider (PT): Joni Fears MD   Encounter Date: 01/08/2019  PT End of Session - 01/08/19 1235    Visit Number  4    Number of Visits  12    Date for PT Re-Evaluation  02/10/19    Authorization Type  BCBS/Federal emp PPO (no auth required- 75 visit limit pt/ot/slp combined)    Authorization Time Period  12/30/18-02/10/19    Authorization - Visit Number  4    Authorization - Number of Visits  10    PT Start Time  [id number]    PT Stop Time  1113    PT Time Calculation (min)  41 min    Activity Tolerance  Patient tolerated treatment well    Behavior During Therapy  [facility] for tasks assessed/performed       Past Medical History:  Diagnosis Date  . Arthritis   . Cancer ([place])    skin melanoma under left eye  . Deaf    LEFT EAR  . GERD (gastroesophageal reflux disease)   . Hyperlipidemia Deaf in Left ear    Past Surgical History:  Procedure Laterality Date  . ABDOMINAL HYSTERECTOMY  2001   Partial  . COLONOSCOPY  01/21/2003   [id number] rectum/ Long capacious colon, moderate prep made the exam more challenging.  Marland Kitchen COLONOSCOPY  05/18/2009   Dr. Gala Romney: Normal rectum/narrow mouth left sided diverticula   . COLONOSCOPY N/A 05/14/2013   Dr. Gala Romney: hemorrhoids, colonic diverticula, redudant colon  . ESOPHAGOGASTRODUODENOSCOPY  01/21/2003   RMR:  Normal esophagus, stomach, D1 and D2  . ESOPHAGOGASTRODUODENOSCOPY N/A 03/24/2014   Dr. Gala Romney :Hiatal hernia; otherwise negative EGD  . TONSILLECTOMY      There were no vitals filed for this visit.  Subjective Assessment - 01/08/19 1032    Subjective  Patient states she did the sit to stand exercises but not the others yesterday. She had some soreness in her knees when completing them.  Yesterday she was sore and she was questioning if the injection she had was wearing off. She was sore in the front of both hips when lying on back this morning but it decreased when she got up.    Pertinent History  low back pain    How long can you sit comfortably?  unrestricted    How long can you stand comfortably?  10 minutes    How long can you walk comfortably?  5 minutes    Patient Stated Goals  to be able to put her sock on without pain and not limp with walking.    Currently in Pain?  No/denies    Pain Onset  More than a month ago                       [facility] Adult PT Treatment/Exercise - 01/08/19 0001      Ambulation/Gait   Ambulation/Gait  Yes    Ambulation/Gait Assistance  7: Independent    Ambulation Distance (Feet)  100 Feet    Gait Pattern  Step-through pattern;Decreased stance time - right;Trendelenburg;Antalgic   decreased hip extension in stance to toe off, RLE is ER   Ambulation Surface  Level;Indoor    Gait Comments  Patient ambulates with decreased antalgic gait when being aware of gait  mechanics, improving since intial evaluation      Knee/Hip Exercises: Sidelying   Clams  3x10 with verbal cueing to limit trunk rotation      Knee/Hip Exercises: Prone   Other Prone Exercises  prone isometric hip external rotation for glute activation 10x10 second holds with verbal and tactile cueing      Manual Therapy   Manual Therapy  Soft tissue mobilization    Manual therapy comments  completed separately from all other aspects of treatment    Soft tissue mobilization  R hip flexor and hip adductors in hooklying             PT Education - 01/08/19 1042    Education Details  Review of HEP, patient educated on importance of consistency with HEP, educated on when to push herself and when not to    [employer]) Educated  Patient    Methods  Explanation    Comprehension  Verbalized understanding       PT Short Term Goals - 01/01/19 1316      PT SHORT  TERM GOAL #1   Title  Patient will be independent with HEP in order to optimize functional outcomes.    Time  3    Period  Weeks    Status  On-going    Target Date  01/20/19      PT SHORT TERM GOAL #2   Title  Patient will report 25% improvement in symptoms for improved function with ADL.    Time  3    Period  Weeks    Status  On-going    Target Date  01/20/19        PT Long Term Goals - 01/01/19 1316      PT LONG TERM GOAL #1   Title  Patient will report at least 75% improvement in symptoms in order for improved quality of life.    Time  6    Period  Weeks    Status  On-going      PT LONG TERM GOAL #2   Title  Patient will improve FOTO score by at least 10 points in order to demonstrate improved activity tolerance.    Time  6    Period  Weeks    Status  On-going      PT LONG TERM GOAL #3   Title  Patient will be able to ambulate at least 450 feet during 2 MWT with improved gait pattern with minimally antalgic gait and improved hip extension ROM for ambulating in the community.    Time  6    Period  Weeks    Status  On-going            Plan - 01/08/19 1236    Clinical Impression Statement  Patient would benefit from completing HEP more consistently for further gains. She ambulates with improving gait compared to initial evaluation when she is aware of her gait mechanics but continues to demonstrate antalgic gait with limited hip extension ROM. There is a decrease in tissue tension with STM to R hip adductors but hip flexors continue to remain hypertonic. She requires frequent verbal cueing and reassurance for what she should be feeling during exercise and what is acceptable levels of discomfort. Patient will continue to benefit from skilled physical therapy in order to improve function and reduce impairment.    Personal Factors and Comorbidities  Age;Past/Current Experience    Examination-Activity Limitations  Bathing;Bed Mobility;Bend;Caring for  Others;Carry;Dressing;Locomotion Level;Squat;Stairs;Stand;Transfers    Examination-Participation  Restrictions  Church;Cleaning;Community Activity;Driving;Laundry;Meal Prep;Shop;Volunteer;Yard Work    Stability/Clinical Decision Making  Stable/Uncomplicated    Rehab Potential  Good    PT Frequency  2x / week    PT Duration  6 weeks    PT Treatment/Interventions  ADLs/Self Care Home Management;Aquatic Therapy;Biofeedback;Electrical Stimulation;Cryotherapy;Iontophoresis 4mg /ml Dexamethasone;Moist Heat;Traction;Ultrasound;DME Instruction;Gait training;Stair training;Functional mobility training;Therapeutic activities;Therapeutic exercise;Balance training;Neuromuscular re-education;Patient/family education;Orthotic Fit/Training;Manual techniques;Compression bandaging;Passive range of motion;Dry needling;Energy conservation;Splinting;Taping;Vasopneumatic Device;Spinal Manipulations;Joint Manipulations    PT Next Visit Plan  continue hip mobility exercises, continue hip and core strengthening and progress as tolerated    PT Home Exercise Plan  01/01/19 bridges 2x10, supine marches 2x10 01/06/19 sit to stand 01/08/19 clams 3x10, prone heel press together for hip external rotation 10x10 second holds    Consulted and Agree with Plan of Care  Patient       Patient will benefit from skilled therapeutic intervention in order to improve the following deficits and impairments:  Abnormal gait, Decreased activity tolerance, Decreased balance, Decreased endurance, Decreased mobility, Decreased range of motion, Decreased strength, Difficulty walking, Increased muscle spasms, Impaired flexibility, Impaired tone, Improper body mechanics, Pain  Visit Diagnosis: Pain in right hip  Other abnormalities of gait and mobility  Muscle weakness (generalized)  Stiffness of right hip, not elsewhere classified     Problem List Patient Active Problem List   Diagnosis Date Noted  . Unilateral primary osteoarthritis, right  hip 08/14/2018  . Epigastric discomfort 12/29/2016  . Dyslipidemia 01/13/2016  . Murmur 01/13/2016  . Essential hypertension 01/13/2016  . Hiatal hernia   . Dyspepsia 03/18/2014  . RLQ abdominal pain 08/08/2013  . 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 HYPOTHYROIDISM 11/05/2008  . Constipation 11/04/2008  . GERD 06/18/2008  . CHEST PAIN 06/18/2008    12:43 PM, 01/08/19 Mearl Latin PT, DPT Physical Therapist at [place], [place], [address] Phone: [phone]   Fax:  [phone]  Name: Eileen Green MRN: [id number] Date of Birth: Mar 03, 1950

## 2019-01-08 NOTE — Patient Instructions (Signed)
Access Code: [email or web address]  URL: [email or web address]  Date: 01/08/2019  Prepared by: Mitzi Hansen Calob Baskette   Exercises Clamshell - 10 reps - 3 sets - 1x daily - 7x weekly Prone Heel Squeeze - 10 reps - 10 second holds hold - 1x daily - 7x weekly

## 2019-01-13 ENCOUNTER — Other Ambulatory Visit: Payer: Self-pay

## 2019-01-13 ENCOUNTER — Ambulatory Visit (HOSPITAL_COMMUNITY): Payer: Federal, State, Local not specified - PPO | Admitting: Physical Therapy

## 2019-01-13 DIAGNOSIS — M25551 Pain in right hip: Secondary | ICD-10-CM | POA: Diagnosis not present

## 2019-01-13 DIAGNOSIS — M6281 Muscle weakness (generalized): Secondary | ICD-10-CM

## 2019-01-13 DIAGNOSIS — M25651 Stiffness of right hip, not elsewhere classified: Secondary | ICD-10-CM

## 2019-01-13 DIAGNOSIS — R2689 Other abnormalities of gait and mobility: Secondary | ICD-10-CM

## 2019-01-13 NOTE — Patient Instructions (Signed)
Access Code: [facility]  URL: [email or web address]  Date: 01/13/2019  Prepared by: Mitzi Hansen Mava Suares   Exercises Supine Figure 4 Piriformis Stretch - 3 reps - 30 seconds hold - 1x daily - 7x weekly Standing Hip Adductor Stretch - 3 reps - 30 second hold - 1x daily                            - 7x weekly

## 2019-01-13 NOTE — Therapy (Signed)
[place], [place], [address] Phone: [phone]   Fax:  [phone]  Physical Therapy Treatment  Patient Details  Name: Eileen Green MRN: [id number] Date of Birth: 1950/06/04 Referring Provider (PT): Joni Fears MD   Encounter Date: 01/13/2019  PT End of Session - 01/13/19 1043    Visit Number  5    Number of Visits  12    Date for PT Re-Evaluation  02/10/19    Authorization Type  BCBS/Federal emp PPO (no auth required- 75 visit limit pt/ot/slp combined)    Authorization Time Period  12/30/18-02/10/19    Authorization - Visit Number  5    Authorization - Number of Visits  10    PT Start Time  1037   patient checking in   PT Stop Time  1115    PT Time Calculation (min)  38 min    Activity Tolerance  Patient tolerated treatment well    Behavior During Therapy  [facility] for tasks assessed/performed       Past Medical History:  Diagnosis Date  . Arthritis   . Cancer ([place])    skin melanoma under left eye  . Deaf    LEFT EAR  . GERD (gastroesophageal reflux disease)   . Hyperlipidemia Deaf in Left ear    Past Surgical History:  Procedure Laterality Date  . ABDOMINAL HYSTERECTOMY  2001   Partial  . COLONOSCOPY  01/21/2003   [id number] rectum/ Long capacious colon, moderate prep made the exam more challenging.  Marland Kitchen COLONOSCOPY  05/18/2009   Dr. Gala Romney: Normal rectum/narrow mouth left sided diverticula   . COLONOSCOPY N/A 05/14/2013   Dr. Gala Romney: hemorrhoids, colonic diverticula, redudant colon  . ESOPHAGOGASTRODUODENOSCOPY  01/21/2003   RMR:  Normal esophagus, stomach, D1 and D2  . ESOPHAGOGASTRODUODENOSCOPY N/A 03/24/2014   Dr. Gala Romney :Hiatal hernia; otherwise negative EGD  . TONSILLECTOMY      There were no vitals filed for this visit.  Subjective Assessment - 01/13/19 1035    Subjective  Patient states that she is still having discomfort but her function has improved. She does not feel as limited. She  has been able to perform home exercises. She notes improvement with ADLs. Sleeping has improved as well.    Pertinent History  low back pain    How long can you sit comfortably?  unrestricted    How long can you stand comfortably?  10 minutes    How long can you walk comfortably?  5 minutes    Patient Stated Goals  to be able to put her sock on without pain and not limp with walking.    Currently in Pain?  No/denies    Pain Score  2    at worst   Pain Onset  More than a month ago                       [facility] Adult PT Treatment/Exercise - 01/13/19 0001      Knee/Hip Exercises: Standing   Other Standing Knee Exercises  standing hip adductor stretch 3x30 seconds      Knee/Hip Exercises: Supine   Other Supine Knee/Hip Exercises  figure 4 stretch 3x 30 second hold      Manual Therapy   Manual Therapy  Soft tissue mobilization    Manual therapy comments  completed separately from all other aspects of treatment    Soft tissue mobilization  R hip flexor and hip adductors  in [place]             PT Education - 01/13/19 1041    Education Details  Review of HEP, patient educated on importance of consistency with HEP, education on goals of PT and how progressions will be made   Person(s) Educated  Patient    Methods  Explanation    Comprehension  Verbalized understanding       PT Short Term Goals - 01/01/19 1316      PT SHORT TERM GOAL #1   Title  Patient will be independent with HEP in order to optimize functional outcomes.    Time  3    Period  Weeks    Status  On-going    Target Date  01/20/19      PT SHORT TERM GOAL #2   Title  Patient will report 25% improvement in symptoms for improved function with ADL.    Time  3    Period  Weeks    Status  On-going    Target Date  01/20/19        PT Long Term Goals - 01/01/19 1316      PT LONG TERM GOAL #1   Title  Patient will report at least 75% improvement in symptoms in order for improved quality of  life.    Time  6    Period  Weeks    Status  On-going      PT LONG TERM GOAL #2   Title  Patient will improve FOTO score by at least 10 points in order to demonstrate improved activity tolerance.    Time  6    Period  Weeks    Status  On-going      PT LONG TERM GOAL #3   Title  Patient will be able to ambulate at least 450 feet during 2 MWT with improved gait pattern with minimally antalgic gait and improved hip extension ROM for ambulating in the community.    Time  6    Period  Weeks    Status  On-going            Plan - 01/13/19 1044    Clinical Impression Statement  Patient is overall improving with decreased pain and improving function. Patient has hypertonic adductors which are tender to palpation. Decrease in tissue tension following manual therapy. Reviewed HEP with patient and educated importance on completing all exercises given. Patient tolerates hip stretches well with only minor complaints of discomfort and she is educated on not needing to feel pain for it to be effective exercise. Patient will continue to benefit from skilled physical therapy in order to reduce impairment and improve function.    Personal Factors and Comorbidities  Age;Past/Current Experience    Examination-Activity Limitations  Bathing;Bed Mobility;Bend;Caring for Others;Carry;Dressing;Locomotion Level;Squat;Stairs;Stand;Transfers    Examination-Participation Restrictions  Church;Cleaning;Community Activity;Driving;Laundry;Meal Prep;Shop;Volunteer;Yard Work    Stability/Clinical Decision Making  Stable/Uncomplicated    Rehab Potential  Good    PT Frequency  2x / week    PT Duration  6 weeks    PT Treatment/Interventions  ADLs/Self Care Home Management;Aquatic Therapy;Biofeedback;Electrical Stimulation;Cryotherapy;Iontophoresis 4mg /ml Dexamethasone;Moist Heat;Traction;Ultrasound;DME Instruction;Gait training;Stair training;Functional mobility training;Therapeutic activities;Therapeutic exercise;Balance  training;Neuromuscular re-education;Patient/family education;Orthotic Fit/Training;Manual techniques;Compression bandaging;Passive range of motion;Dry needling;Energy conservation;Splinting;Taping;Vasopneumatic Device;Spinal Manipulations;Joint Manipulations    PT Next Visit Plan  continue hip mobility exercises, continue hip and core strengthening and progress as tolerated    PT Home Exercise Plan  01/01/19 bridges 2x10, supine marches 2x10 01/06/19 sit to stand 01/08/19  clams 3x10, prone heel press together for hip external rotation 10x10 second holds    Consulted and Agree with Plan of Care  Patient       Patient will benefit from skilled therapeutic intervention in order to improve the following deficits and impairments:  Abnormal gait, Decreased activity tolerance, Decreased balance, Decreased endurance, Decreased mobility, Decreased range of motion, Decreased strength, Difficulty walking, Increased muscle spasms, Impaired flexibility, Impaired tone, Improper body mechanics, Pain  Visit Diagnosis: Pain in right hip  Other abnormalities of gait and mobility  Muscle weakness (generalized)  Stiffness of right hip, not elsewhere classified     Problem List Patient Active Problem List   Diagnosis Date Noted  . Unilateral primary osteoarthritis, right hip 08/14/2018  . Epigastric discomfort 12/29/2016  . Dyslipidemia 01/13/2016  . Murmur 01/13/2016  . Essential hypertension 01/13/2016  . Hiatal hernia   . Dyspepsia 03/18/2014  . RLQ abdominal pain 08/08/2013  . 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 HYPOTHYROIDISM 11/05/2008  . Constipation 11/04/2008  . GERD 06/18/2008  . CHEST PAIN 06/18/2008    12:46 PM, 01/13/19 Mearl Latin PT, DPT Physical Therapist at [place], [place], [address] Phone: [phone]   Fax:  [phone]  Name: Eileen Green MRN:  [id number] Date of Birth: 20-Nov-1950

## 2019-01-15 ENCOUNTER — Encounter (HOSPITAL_COMMUNITY): Payer: Self-pay | Admitting: Physical Therapy

## 2019-01-15 ENCOUNTER — Ambulatory Visit (HOSPITAL_COMMUNITY): Payer: Federal, State, Local not specified - PPO | Admitting: Physical Therapy

## 2019-01-15 ENCOUNTER — Other Ambulatory Visit: Payer: Self-pay

## 2019-01-15 DIAGNOSIS — M25651 Stiffness of right hip, not elsewhere classified: Secondary | ICD-10-CM

## 2019-01-15 DIAGNOSIS — R2689 Other abnormalities of gait and mobility: Secondary | ICD-10-CM

## 2019-01-15 DIAGNOSIS — M25551 Pain in right hip: Secondary | ICD-10-CM | POA: Diagnosis not present

## 2019-01-15 DIAGNOSIS — M6281 Muscle weakness (generalized): Secondary | ICD-10-CM

## 2019-01-15 NOTE — Therapy (Signed)
[place], [place], [address] Phone: [phone]   Fax:  [phone]  Physical Therapy Treatment  Patient Details  Name: Eileen Green MRN: [id number] Date of Birth: [birth year] Referring Provider (PT): Joni Fears MD   Encounter Date: 01/15/2019  PT End of Session - 01/15/19 0912    Visit Number  6    Number of Visits  12    Date for PT Re-Evaluation  02/10/19    Authorization Type  BCBS/Federal emp PPO (no auth required- 75 visit limit pt/ot/slp combined)    Authorization Time Period  12/30/18-02/10/19    Authorization - Visit Number  6    Authorization - Number of Visits  10    PT Start Time  0903    PT Stop Time  0945    PT Time Calculation (min)  42 min    Activity Tolerance  Patient tolerated treatment well    Behavior During Therapy  [facility] for tasks assessed/performed       Past Medical History:  Diagnosis Date  . Arthritis   . Cancer ([place])    skin melanoma under left eye  . Deaf    LEFT EAR  . GERD (gastroesophageal reflux disease)   . Hyperlipidemia Deaf in Left ear    Past Surgical History:  Procedure Laterality Date  . ABDOMINAL HYSTERECTOMY  2001   Partial  . COLONOSCOPY  01/21/2003   [id number] rectum/ Long capacious colon, moderate prep made the exam more challenging.  Marland Kitchen COLONOSCOPY  05/18/2009   Dr. Gala Romney: Normal rectum/narrow mouth left sided diverticula   . COLONOSCOPY N/A 05/14/2013   Dr. Gala Romney: hemorrhoids, colonic diverticula, redudant colon  . ESOPHAGOGASTRODUODENOSCOPY  01/21/2003   RMR:  Normal esophagus, stomach, D1 and D2  . ESOPHAGOGASTRODUODENOSCOPY N/A 03/24/2014   Dr. Gala Romney :Hiatal hernia; otherwise negative EGD  . TONSILLECTOMY      There were no vitals filed for this visit.  Subjective Assessment - 01/15/19 0904    Subjective  Patient reports that she woke up a few days ago and she had restless legs over the weekend. Yesterday she woke up with a warm ache when she  woke up. She has maintained progress since the injection but there are still things she is limited with. She fatigues quickly with walking even in her house after 2 minutes. She still has achy feeling right now.    Pertinent History  low back pain    How long can you sit comfortably?  unrestricted    How long can you stand comfortably?  10 minutes    How long can you walk comfortably?  5 minutes    Patient Stated Goals  to be able to put her sock on without pain and not limp with walking.    Pain Score  1    0.75/10   Pain Location  Hip    Pain Onset  More than a month ago                       [facility] Adult PT Treatment/Exercise - 01/15/19 0001      Knee/Hip Exercises: Standing   Forward Step Up  2 sets;10 reps;Hand Hold: 1;Step Height: 6"    Other Standing Knee Exercises  standing hip abduction 2x10 bilateral    Other Standing Knee Exercises  lateral stepping 4x20 feet bilateral      Knee/Hip Exercises: Sidelying   Hip ABduction  Right;2 sets;10 reps  Hip ABduction Limitations  verbal and tactile cueing for limiting trunk movement      Manual Therapy   Manual Therapy  Soft tissue mobilization    Manual therapy comments  completed separately from all other aspects of treatment    Soft tissue mobilization  R hip flexor and hip adductors in [place]             PT Education - 01/15/19 0911    Education Details  Patient educated on HEP    Person(s) Educated  Patient    Methods  Explanation    Comprehension  Verbalized understanding       PT Short Term Goals - 01/01/19 1316      PT SHORT TERM GOAL #1   Title  Patient will be independent with HEP in order to optimize functional outcomes.    Time  3    Period  Weeks    Status  On-going    Target Date  01/20/19      PT SHORT TERM GOAL #2   Title  Patient will report 25% improvement in symptoms for improved function with ADL.    Time  3    Period  Weeks    Status  On-going    Target Date  01/20/19         PT Long Term Goals - 01/01/19 1316      PT LONG TERM GOAL #1   Title  Patient will report at least 75% improvement in symptoms in order for improved quality of life.    Time  6    Period  Weeks    Status  On-going      PT LONG TERM GOAL #2   Title  Patient will improve FOTO score by at least 10 points in order to demonstrate improved activity tolerance.    Time  6    Period  Weeks    Status  On-going      PT LONG TERM GOAL #3   Title  Patient will be able to ambulate at least 450 feet during 2 MWT with improved gait pattern with minimally antalgic gait and improved hip extension ROM for ambulating in the community.    Time  6    Period  Weeks    Status  On-going            Plan - 01/15/19 1332    Clinical Impression Statement  Patient ambulating with decreased antalgic gait but continues to demonstrate slight crouched gait with limited hip extension. Her hip adductors are not as tender today to palpation and she does not have any benefit from hip distraction. She is able to complete standing exercises today with focus on hip abductor strengthening. She does well with step ups with increased reps but requires cueing for quad/hip abductor activation. Patient will continue to benefit from skilled physical therapy in order to improve function and reduce impairment.    Personal Factors and Comorbidities  Age;Past/Current Experience    Examination-Activity Limitations  Bathing;Bed Mobility;Bend;Caring for Others;Carry;Dressing;Locomotion Level;Squat;Stairs;Stand;Transfers    Examination-Participation Restrictions  Church;Cleaning;Community Activity;Driving;Laundry;Meal Prep;Shop;Volunteer;Yard Work    Stability/Clinical Decision Making  Stable/Uncomplicated    Rehab Potential  Good    PT Frequency  2x / week    PT Duration  6 weeks    PT Treatment/Interventions  ADLs/Self Care Home Management;Aquatic Therapy;Biofeedback;Electrical Stimulation;Cryotherapy;Iontophoresis 4mg /ml  Dexamethasone;Moist Heat;Traction;Ultrasound;DME Instruction;Gait training;Stair training;Functional mobility training;Therapeutic activities;Therapeutic exercise;Balance training;Neuromuscular re-education;Patient/family education;Orthotic Fit/Training;Manual techniques;Compression bandaging;Passive range of motion;Dry needling;Energy conservation;Splinting;Taping;Vasopneumatic Device;Spinal Manipulations;Joint Manipulations  PT Next Visit Plan  continue hip mobility exercises, continue hip and core strengthening and progress as tolerated    PT Home Exercise Plan  01/01/19 bridges 2x10, supine marches 2x10 01/06/19 sit to stand 01/08/19 clams 3x10, prone heel press together for hip external rotation 10x10 second holds 01/15/19 lateral stepping, step up 2x10    Consulted and Agree with Plan of Care  Patient       Patient will benefit from skilled therapeutic intervention in order to improve the following deficits and impairments:  Abnormal gait, Decreased activity tolerance, Decreased balance, Decreased endurance, Decreased mobility, Decreased range of motion, Decreased strength, Difficulty walking, Increased muscle spasms, Impaired flexibility, Impaired tone, Improper body mechanics, Pain  Visit Diagnosis: Pain in right hip  Other abnormalities of gait and mobility  Muscle weakness (generalized)  Stiffness of right hip, not elsewhere classified     Problem List Patient Active Problem List   Diagnosis Date Noted  . Unilateral primary osteoarthritis, right hip 08/14/2018  . Epigastric discomfort 12/29/2016  . Dyslipidemia 01/13/2016  . Murmur 01/13/2016  . Essential hypertension 01/13/2016  . Hiatal hernia   . Dyspepsia 03/18/2014  . RLQ abdominal pain 08/08/2013  . 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 HYPOTHYROIDISM 11/05/2008  . Constipation 11/04/2008  . GERD 06/18/2008  . CHEST PAIN 06/18/2008    1:50 PM, 01/15/19 Mearl Latin PT, DPT Physical Therapist at [place], [place], [address] Phone: [phone]   Fax:  [phone]  Name: Eileen Green MRN: [id number] Date of Birth: Jun 17, 1950

## 2019-01-15 NOTE — Patient Instructions (Signed)
Access Code: [email or web address]  URL: [email or web address]  Date: 01/15/2019  Prepared by: Mitzi Hansen Tevan Marian   Exercises Step Up - 10 reps - 2 sets - 1x daily - 7x weekly Side Stepping with Counter Support - 20 reps - 4 sets - 1x daily - 7x weekly Standing Hip Abduction with Counter Support - 10 reps - 2 sets - 1x daily - 7x weekly

## 2019-01-20 ENCOUNTER — Ambulatory Visit (HOSPITAL_COMMUNITY): Payer: Federal, State, Local not specified - PPO | Admitting: Physical Therapy

## 2019-01-20 ENCOUNTER — Other Ambulatory Visit: Payer: Self-pay

## 2019-01-20 ENCOUNTER — Encounter (HOSPITAL_COMMUNITY): Payer: Self-pay | Admitting: Physical Therapy

## 2019-01-20 DIAGNOSIS — R2689 Other abnormalities of gait and mobility: Secondary | ICD-10-CM

## 2019-01-20 DIAGNOSIS — M25651 Stiffness of right hip, not elsewhere classified: Secondary | ICD-10-CM

## 2019-01-20 DIAGNOSIS — M25551 Pain in right hip: Secondary | ICD-10-CM | POA: Diagnosis not present

## 2019-01-20 DIAGNOSIS — M6281 Muscle weakness (generalized): Secondary | ICD-10-CM

## 2019-01-20 NOTE — Therapy (Signed)
[place], [place], [address] Phone: [phone]   Fax:  [phone]  Physical Therapy Treatment  Patient Details  Name: Eileen Green MRN: [id number] Date of Birth: Oct 22, 1950 Referring Provider (PT): Joni Fears MD   Encounter Date: 01/20/2019  PT End of Session - 01/20/19 1402    Visit Number  7    Number of Visits  12    Date for PT Re-Evaluation  02/10/19    Authorization Type  BCBS/Federal emp PPO (no auth required- 75 visit limit pt/ot/slp combined)    Authorization Time Period  12/30/18-02/10/19    Authorization - Visit Number  7    Authorization - Number of Visits  10    PT Start Time  1301    PT Stop Time  1354    PT Time Calculation (min)  53 min    Activity Tolerance  Patient tolerated treatment well    Behavior During Therapy  [facility] for tasks assessed/performed       Past Medical History:  Diagnosis Date  . Arthritis   . Cancer ([place])    skin melanoma under left eye  . Deaf    LEFT EAR  . GERD (gastroesophageal reflux disease)   . Hyperlipidemia Deaf in Left ear    Past Surgical History:  Procedure Laterality Date  . ABDOMINAL HYSTERECTOMY  2001   Partial  . COLONOSCOPY  01/21/2003   [id number] rectum/ Long capacious colon, moderate prep made the exam more challenging.  Marland Kitchen COLONOSCOPY  05/18/2009   Dr. Gala Romney: Normal rectum/narrow mouth left sided diverticula   . COLONOSCOPY N/A 05/14/2013   Dr. Gala Romney: hemorrhoids, colonic diverticula, redudant colon  . ESOPHAGOGASTRODUODENOSCOPY  01/21/2003   RMR:  Normal esophagus, stomach, D1 and D2  . ESOPHAGOGASTRODUODENOSCOPY N/A 03/24/2014   Dr. Gala Romney :Hiatal hernia; otherwise negative EGD  . TONSILLECTOMY      There were no vitals filed for this visit.  Subjective Assessment - 01/20/19 1300    Subjective  Patient states that she is still having difficulty with things. She feels aching in her hip which has been increasing more. She feels that  her cortisone shot may be wearing off. She does not do exercises every day, she splits them in half. She has done each exercise about once.    Pertinent History  low back pain    How long can you sit comfortably?  unrestricted    How long can you stand comfortably?  10 minutes    How long can you walk comfortably?  5 minutes    Patient Stated Goals  to be able to put her sock on without pain and not limp with walking.    Currently in Pain?  No/denies    Pain Location  Hip    Pain Onset  More than a month ago                       [facility] Adult PT Treatment/Exercise - 01/20/19 0001      Knee/Hip Exercises: Standing   Forward Step Up  2 sets;10 reps;Hand Hold: 1;Step Height: 6"    Forward Step Up Limitations  verbal cueing and demonstration for mechanics/positioning    Other Standing Knee Exercises  standing hip abduction 2x10 bilateral    Other Standing Knee Exercises  lateral stepping 4x20 feet bilateral         PT Education - 01/20/19 1403    Education Details  Patient  educated extensively on HEP, conistency of completing HEP, hip anatomy, hip abductor strength for functioning, acceptable levels of pain/discomfort, and reviewed complete HEP.    Person(s) Educated  Patient    Methods  Explanation    Comprehension  Verbalized understanding       PT Short Term Goals - 01/01/19 1316      PT SHORT TERM GOAL #1   Title  Patient will be independent with HEP in order to optimize functional outcomes.    Time  3    Period  Weeks    Status  On-going    Target Date  01/20/19      PT SHORT TERM GOAL #2   Title  Patient will report 25% improvement in symptoms for improved function with ADL.    Time  3    Period  Weeks    Status  On-going    Target Date  01/20/19        PT Long Term Goals - 01/01/19 1316      PT LONG TERM GOAL #1   Title  Patient will report at least 75% improvement in symptoms in order for improved quality of life.    Time  6    Period  Weeks     Status  On-going      PT LONG TERM GOAL #2   Title  Patient will improve FOTO score by at least 10 points in order to demonstrate improved activity tolerance.    Time  6    Period  Weeks    Status  On-going      PT LONG TERM GOAL #3   Title  Patient will be able to ambulate at least 450 feet during 2 MWT with improved gait pattern with minimally antalgic gait and improved hip extension ROM for ambulating in the community.    Time  6    Period  Weeks    Status  On-going            Plan - 01/20/19 1405    Clinical Impression Statement  Patient progress is likely being limited secondary to inconsistent performance of HEP. Review of HEP with patient today with education on importance of completing for positive results. Patient is educated extensively and PT discussed with on consistency of HEP, appropriate levels of pain and discomfort with exercise, hip anatomy, hip replacements, her impairments and possible reasons for them, hip abductor strength and correlation to function/mechanics, potential outcomes, and proper mechanics and positioning of exercises. Patient requires prior demonstration for completing step up exercise for mechanics. She states verbal understanding to all education provided today. Patient will continue to benefit from skilled physical therapy in order to improve function and reduce impairment.    Personal Factors and Comorbidities  Age;Past/Current Experience    Examination-Activity Limitations  Bathing;Bed Mobility;Bend;Caring for Others;Carry;Dressing;Locomotion Level;Squat;Stairs;Stand;Transfers    Examination-Participation Restrictions  Church;Cleaning;Community Activity;Driving;Laundry;Meal Prep;Shop;Volunteer;Yard Work    Stability/Clinical Decision Making  Stable/Uncomplicated    Rehab Potential  Good    PT Frequency  2x / week    PT Duration  6 weeks    PT Treatment/Interventions  ADLs/Self Care Home Management;Aquatic Therapy;Biofeedback;Electrical  Stimulation;Cryotherapy;Iontophoresis 4mg /ml Dexamethasone;Moist Heat;Traction;Ultrasound;DME Instruction;Gait training;Stair training;Functional mobility training;Therapeutic activities;Therapeutic exercise;Balance training;Neuromuscular re-education;Patient/family education;Orthotic Fit/Training;Manual techniques;Compression bandaging;Passive range of motion;Dry needling;Energy conservation;Splinting;Taping;Vasopneumatic Device;Spinal Manipulations;Joint Manipulations    PT Next Visit Plan  continue hip mobility exercises, continue hip and core strengthening and progress as tolerated    PT Home Exercise Plan  01/01/19 bridges 2x10, supine marches  2x10 01/06/19 sit to stand 01/08/19 clams 3x10, prone heel press together for hip external rotation 10x10 second holds 01/15/19 lateral stepping, step up 2x10    Consulted and Agree with Plan of Care  Patient       Patient will benefit from skilled therapeutic intervention in order to improve the following deficits and impairments:  Abnormal gait, Decreased activity tolerance, Decreased balance, Decreased endurance, Decreased mobility, Decreased range of motion, Decreased strength, Difficulty walking, Increased muscle spasms, Impaired flexibility, Impaired tone, Improper body mechanics, Pain  Visit Diagnosis: Pain in right hip  Other abnormalities of gait and mobility  Muscle weakness (generalized)  Stiffness of right hip, not elsewhere classified     Problem List Patient Active Problem List   Diagnosis Date Noted  . Unilateral primary osteoarthritis, right hip 08/14/2018  . Epigastric discomfort 12/29/2016  . Dyslipidemia 01/13/2016  . Murmur 01/13/2016  . Essential hypertension 01/13/2016  . Hiatal hernia   . Dyspepsia 03/18/2014  . RLQ abdominal pain 08/08/2013  . 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 HYPOTHYROIDISM 11/05/2008  . Constipation 11/04/2008  . GERD 06/18/2008  . CHEST PAIN 06/18/2008    2:23 PM, 01/20/19 Mearl Latin PT, DPT Physical Therapist at [place], [place], [address] Phone: [phone]   Fax:  [phone]  Name: Eileen Green MRN: [id number] Date of Birth: 26-Jan-1950

## 2019-01-22 ENCOUNTER — Encounter (HOSPITAL_COMMUNITY): Payer: Federal, State, Local not specified - PPO | Admitting: Physical Therapy

## 2019-01-22 ENCOUNTER — Other Ambulatory Visit: Payer: Self-pay

## 2019-01-22 ENCOUNTER — Encounter (HOSPITAL_COMMUNITY): Payer: Self-pay | Admitting: Physical Therapy

## 2019-01-22 ENCOUNTER — Ambulatory Visit (HOSPITAL_COMMUNITY): Payer: Federal, State, Local not specified - PPO | Admitting: Physical Therapy

## 2019-01-22 DIAGNOSIS — M25651 Stiffness of right hip, not elsewhere classified: Secondary | ICD-10-CM

## 2019-01-22 DIAGNOSIS — M6281 Muscle weakness (generalized): Secondary | ICD-10-CM

## 2019-01-22 DIAGNOSIS — R2689 Other abnormalities of gait and mobility: Secondary | ICD-10-CM

## 2019-01-22 DIAGNOSIS — M25551 Pain in right hip: Secondary | ICD-10-CM

## 2019-01-22 NOTE — Therapy (Signed)
[place], [place], [address] Phone: [phone]   Fax:  [phone]  Physical Therapy Treatment  Patient Details  Name: [name] MRN: [id number] Date of Birth: [birth year] Referring Provider (PT): Joni Fears MD   Encounter Date: 01/22/2019  PT End of Session - 01/22/19 1437    Visit Number  8    Number of Visits  12    Date for PT Re-Evaluation  02/10/19    Authorization Type  BCBS/Federal emp PPO (no auth required- 75 visit limit pt/ot/slp combined)    Authorization Time Period  12/30/18-02/10/19    Authorization - Visit Number  8    Authorization - Number of Visits  10    PT Start Time  [id number]    PT Stop Time  1432    PT Time Calculation (min)  45 min    Activity Tolerance  Patient tolerated treatment well    Behavior During Therapy  [facility] for tasks assessed/performed       Past Medical History:  Diagnosis Date  . Arthritis   . Cancer ([place])    skin melanoma under left eye  . Deaf    LEFT EAR  . GERD (gastroesophageal reflux disease)   . Hyperlipidemia Deaf in Left ear    Past Surgical History:  Procedure Laterality Date  . ABDOMINAL HYSTERECTOMY  2001   Partial  . COLONOSCOPY  01/21/2003   [id number] rectum/ Long capacious colon, moderate prep made the exam more challenging.  Marland Kitchen COLONOSCOPY  05/18/2009   Dr. Gala Romney: Normal rectum/narrow mouth left sided diverticula   . COLONOSCOPY N/A 05/14/2013   Dr. Gala Romney: hemorrhoids, colonic diverticula, redudant colon  . ESOPHAGOGASTRODUODENOSCOPY  01/21/2003   RMR:  Normal esophagus, stomach, D1 and D2  . ESOPHAGOGASTRODUODENOSCOPY N/A 03/24/2014   Dr. Gala Romney :Hiatal hernia; otherwise negative EGD  . TONSILLECTOMY      There were no vitals filed for this visit.  Subjective Assessment - 01/22/19 1349    Subjective  Patient continues pinching pain and she notices her big toe is stiff which she thinks is contributing. She gets very tired when walking.  She was able to do most of her exercises yesterday. She still is having pain during the night that is achy. She feels that her shot may be wearing off. She is not sure if her pain is from exercise, arthritis or both. She continues to have difficulty with putting on socks and bending her leg up toward her.    Pertinent History  low back pain    How long can you sit comfortably?  unrestricted    How long can you stand comfortably?  10 minutes    How long can you walk comfortably?  5 minutes    Patient Stated Goals  to be able to put her sock on without pain and not limp with walking.    Currently in Pain?  Yes    Pain Score  1    between 0 and 1   Pain Onset  More than a month ago                       [facility] Adult PT Treatment/Exercise - 01/22/19 0001      Knee/Hip Exercises: Stretches   Other Knee/Hip Stretches  seated figure 4 stretch      Knee/Hip Exercises: Standing   Lateral Step Up  2 sets;10 reps;Hand Hold: 2;Step Height: 6"    Lateral  Step Up Limitations  focus on eccentric control    Forward Step Up  2 sets;10 reps;Hand Hold: 1;Step Height: 6"    Forward Step Up Limitations  verbal cueing and demonstration for mechanics/positioning    Other Standing Knee Exercises  standing hip abduction 2x10 bilateral; tandem stance 2x30 seconds; SLS 3x30 seconds             PT Education - 01/22/19 1437    Education Details  Review of HEP, patient educated on completing HEP    Person(s) Educated  Patient    Methods  Explanation    Comprehension  Verbalized understanding       PT Short Term Goals - 01/01/19 1316      PT SHORT TERM GOAL #1   Title  Patient will be independent with HEP in order to optimize functional outcomes.    Time  3    Period  Weeks    Status  On-going    Target Date  01/20/19      PT SHORT TERM GOAL #2   Title  Patient will report 25% improvement in symptoms for improved function with ADL.    Time  3    Period  Weeks    Status  On-going     Target Date  01/20/19        PT Long Term Goals - 01/01/19 1316      PT LONG TERM GOAL #1   Title  Patient will report at least 75% improvement in symptoms in order for improved quality of life.    Time  6    Period  Weeks    Status  On-going      PT LONG TERM GOAL #2   Title  Patient will improve FOTO score by at least 10 points in order to demonstrate improved activity tolerance.    Time  6    Period  Weeks    Status  On-going      PT LONG TERM GOAL #3   Title  Patient will be able to ambulate at least 450 feet during 2 MWT with improved gait pattern with minimally antalgic gait and improved hip extension ROM for ambulating in the community.    Time  6    Period  Weeks    Status  On-going            Plan - 01/22/19 1440    Clinical Impression Statement  Patient educated on sitting positions and ways that she sits may be contributing to stiffness she experiences. She is able to perform step up with cueing for full available hip extension ROM. She does very well with lateral step down with focus on eccentric component. She is educated on discomfort with exercise is normal and that she is feeling appropriate muscles working. Patient demonstrates impaired static balance with balance exercises but is able to complete with intermittent UE support. Patient will continue to benefit from skilled physical therapy in order to improve function and reduce impairment.    Personal Factors and Comorbidities  Age;Past/Current Experience    Examination-Activity Limitations  Bathing;Bed Mobility;Bend;Caring for Others;Carry;Dressing;Locomotion Level;Squat;Stairs;Stand;Transfers    Examination-Participation Restrictions  Church;Cleaning;Community Activity;Driving;Laundry;Meal Prep;Shop;Volunteer;Yard Work    Stability/Clinical Decision Making  Stable/Uncomplicated    Rehab Potential  Good    PT Frequency  2x / week    PT Duration  6 weeks    PT Treatment/Interventions  ADLs/Self Care Home  Management;Aquatic Therapy;Biofeedback;Electrical Stimulation;Cryotherapy;Iontophoresis 4mg /ml Dexamethasone;Moist Heat;Traction;Ultrasound;DME Instruction;Gait training;Stair training;Functional mobility training;Therapeutic activities;Therapeutic  exercise;Balance training;Neuromuscular re-education;Patient/family education;Orthotic Fit/Training;Manual techniques;Compression bandaging;Passive range of motion;Dry needling;Energy conservation;Splinting;Taping;Vasopneumatic Device;Spinal Manipulations;Joint Manipulations    PT Next Visit Plan  continue hip mobility exercises, continue hip and core strengthening and progress as tolerated    PT Home Exercise Plan  01/01/19 bridges 2x10, supine marches 2x10 01/06/19 sit to stand 01/08/19 clams 3x10, prone heel press together for hip external rotation 10x10 second holds 01/15/19 lateral stepping, step up 2x10 01/22/19: Lateral step up 2x10 RLE, SLS 3 x 30 seconds    Consulted and Agree with Plan of Care  Patient       Patient will benefit from skilled therapeutic intervention in order to improve the following deficits and impairments:  Abnormal gait, Decreased activity tolerance, Decreased balance, Decreased endurance, Decreased mobility, Decreased range of motion, Decreased strength, Difficulty walking, Increased muscle spasms, Impaired flexibility, Impaired tone, Improper body mechanics, Pain  Visit Diagnosis: Pain in right hip  Other abnormalities of gait and mobility  Muscle weakness (generalized)  Stiffness of right hip, not elsewhere classified     Problem List Patient Active Problem List   Diagnosis Date Noted  . Unilateral primary osteoarthritis, right hip 08/14/2018  . Epigastric discomfort 12/29/2016  . Dyslipidemia 01/13/2016  . Murmur 01/13/2016  . Essential hypertension 01/13/2016  . Hiatal hernia   . Dyspepsia 03/18/2014  . RLQ abdominal pain 08/08/2013  . 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 HYPOTHYROIDISM 11/05/2008  .  Constipation 11/04/2008  . GERD 06/18/2008  . CHEST PAIN 06/18/2008   2:49 PM, 01/22/19 Mearl Latin PT, DPT Physical Therapist at [place], [place], [address] Phone: [phone]   Fax:  [phone]  Name: KRISTALYN BISPING MRN: [id number] Date of Birth: February 01, 1950

## 2019-01-22 NOTE — Patient Instructions (Signed)
Access Code: [email or web address]  URL: [email or web address]  Date: 01/22/2019  Prepared by: Mitzi Hansen Macee Venables   Exercises Step Up - 10 reps - 2 sets - 1x daily - 7x weekly Lateral Step Down - 10 reps - 2 sets - 1x daily - 7x weekly Standing Single Leg Stance with Unilateral Counter Support - 3 reps - 30 second hold - 1x daily - 7x weekly Standing Hip Abduction with Counter Support - 10 reps - 2 sets - 1x daily - 7x weekly Side Stepping with Counter Support - 10 reps - 4 sets - 1x daily - 7x weekly

## 2019-01-26 ENCOUNTER — Other Ambulatory Visit: Payer: Self-pay | Admitting: Gastroenterology

## 2019-01-26 DIAGNOSIS — K219 Gastro-esophageal reflux disease without esophagitis: Secondary | ICD-10-CM

## 2019-01-27 ENCOUNTER — Ambulatory Visit (HOSPITAL_COMMUNITY): Payer: Federal, State, Local not specified - PPO | Admitting: Physical Therapy

## 2019-01-27 ENCOUNTER — Encounter (HOSPITAL_COMMUNITY): Payer: Self-pay | Admitting: Physical Therapy

## 2019-01-27 ENCOUNTER — Other Ambulatory Visit: Payer: Self-pay

## 2019-01-27 DIAGNOSIS — M25651 Stiffness of right hip, not elsewhere classified: Secondary | ICD-10-CM

## 2019-01-27 DIAGNOSIS — M25551 Pain in right hip: Secondary | ICD-10-CM | POA: Diagnosis not present

## 2019-01-27 DIAGNOSIS — R2689 Other abnormalities of gait and mobility: Secondary | ICD-10-CM

## 2019-01-27 DIAGNOSIS — M6281 Muscle weakness (generalized): Secondary | ICD-10-CM

## 2019-01-27 NOTE — Therapy (Signed)
[place], [place], [address] Phone: [phone]   Fax:  [phone]  Physical Therapy Treatment  Patient Details  Name: Eileen Green MRN: [id number] Date of Birth: [birth year] Referring Provider (PT): Joni Fears MD   Encounter Date: 01/27/2019  PT End of Session - 01/27/19 1356    Visit Number  9    Number of Visits  12    Date for PT Re-Evaluation  02/10/19    Authorization Type  BCBS/Federal emp PPO (no auth required- 75 visit limit pt/ot/slp combined)    Authorization Time Period  12/30/18-02/10/19    Authorization - Visit Number  9    Authorization - Number of Visits  10    PT Start Time  1300    PT Stop Time  1341    PT Time Calculation (min)  41 min    Activity Tolerance  Patient tolerated treatment well    Behavior During Therapy  [facility] for tasks assessed/performed       Past Medical History:  Diagnosis Date  . Arthritis   . Cancer ([place])    skin melanoma under left eye  . Deaf    LEFT EAR  . GERD (gastroesophageal reflux disease)   . Hyperlipidemia Deaf in Left ear    Past Surgical History:  Procedure Laterality Date  . ABDOMINAL HYSTERECTOMY  2001   Partial  . COLONOSCOPY  01/21/2003   [id number] rectum/ Long capacious colon, moderate prep made the exam more challenging.  Marland Kitchen COLONOSCOPY  05/18/2009   Dr. Gala Romney: Normal rectum/narrow mouth left sided diverticula   . COLONOSCOPY N/A 05/14/2013   Dr. Gala Romney: hemorrhoids, colonic diverticula, redudant colon  . ESOPHAGOGASTRODUODENOSCOPY  01/21/2003   RMR:  Normal esophagus, stomach, D1 and D2  . ESOPHAGOGASTRODUODENOSCOPY N/A 03/24/2014   Dr. Gala Romney :Hiatal hernia; otherwise negative EGD  . TONSILLECTOMY      There were no vitals filed for this visit.  Subjective Assessment - 01/27/19 1259    Subjective  Patient reports she doesn't think her hip is getting any better. She is trying to make more of an effort to do things like sit easily and  not fall.    Pertinent History  low back pain    How long can you sit comfortably?  unrestricted    How long can you stand comfortably?  10 minutes    How long can you walk comfortably?  5 minutes    Patient Stated Goals  to be able to put her sock on without pain and not limp with walking.    Currently in Pain?  No/denies    Pain Onset  More than a month ago                       [facility] Adult PT Treatment/Exercise - 01/27/19 0001      Knee/Hip Exercises: Standing   Lateral Step Up  2 sets;10 reps;Hand Hold: 2;Step Height: 6"    Lateral Step Up Limitations  focus on eccentric control    Forward Step Up  2 sets;10 reps;Hand Hold: 1;Step Height: 6"    Forward Step Up Limitations  verbal cueing and demonstration for mechanics/positioning    Other Standing Knee Exercises  SLS 3x30 seconds: SLS with vectors 5x3; Hip hike 2x10 on step    Other Standing Knee Exercises  lateral stepping 6x20 feet bilateral with red band at knees  PT Education - 01/27/19 1355    Education Details  Review of HEP and mechanics/positioning of HEP    Person(s) Educated  Patient    Methods  Explanation;Handout    Comprehension  Verbalized understanding       PT Short Term Goals - 01/01/19 1316      PT SHORT TERM GOAL #1   Title  Patient will be independent with HEP in order to optimize functional outcomes.    Time  3    Period  Weeks    Status  On-going    Target Date  01/20/19      PT SHORT TERM GOAL #2   Title  Patient will report 25% improvement in symptoms for improved function with ADL.    Time  3    Period  Weeks    Status  On-going    Target Date  01/20/19        PT Long Term Goals - 01/01/19 1316      PT LONG TERM GOAL #1   Title  Patient will report at least 75% improvement in symptoms in order for improved quality of life.    Time  6    Period  Weeks    Status  On-going      PT LONG TERM GOAL #2   Title  Patient will improve FOTO score by at least  10 points in order to demonstrate improved activity tolerance.    Time  6    Period  Weeks    Status  On-going      PT LONG TERM GOAL #3   Title  Patient will be able to ambulate at least 450 feet during 2 MWT with improved gait pattern with minimally antalgic gait and improved hip extension ROM for ambulating in the community.    Time  6    Period  Weeks    Status  On-going            Plan - 01/27/19 1356    Clinical Impression Statement  Patient requires verbal cueing and demonstration for proper mechanics of previously completed stair exercises especially lateral step down with curing for slow controlled decent. She is able to complete lateral stepping with band at knee with increase in hip symptoms. Patient is hyperaware of hip symptoms and overall pain/discomfort experienced during exercise. SLS with vectors and hip hike added for hip abductor strengthening are tolerated well and require min verbal cueing to complete. Patient will continue to benefit from skilled physical therapy in order to improve function and reduce impairment.    Personal Factors and Comorbidities  Age;Past/Current Experience    Examination-Activity Limitations  Bathing;Bed Mobility;Bend;Caring for Others;Carry;Dressing;Locomotion Level;Squat;Stairs;Stand;Transfers    Examination-Participation Restrictions  Church;Cleaning;Community Activity;Driving;Laundry;Meal Prep;Shop;Volunteer;Yard Work    Stability/Clinical Decision Making  Stable/Uncomplicated    Rehab Potential  Good    PT Frequency  2x / week    PT Duration  6 weeks    PT Treatment/Interventions  ADLs/Self Care Home Management;Aquatic Therapy;Biofeedback;Electrical Stimulation;Cryotherapy;Iontophoresis 4mg /ml Dexamethasone;Moist Heat;Traction;Ultrasound;DME Instruction;Gait training;Stair training;Functional mobility training;Therapeutic activities;Therapeutic exercise;Balance training;Neuromuscular re-education;Patient/family education;Orthotic  Fit/Training;Manual techniques;Compression bandaging;Passive range of motion;Dry needling;Energy conservation;Splinting;Taping;Vasopneumatic Device;Spinal Manipulations;Joint Manipulations    PT Next Visit Plan  continue hip mobility exercises, continue hip and core strengthening and progress as tolerated    PT Home Exercise Plan  01/01/19 bridges 2x10, supine marches 2x10 01/06/19 sit to stand 01/08/19 clams 3x10, prone heel press together for hip external rotation 10x10 second holds 01/15/19 lateral stepping, step up 2x10 01/22/19:  Lateral step up 2x10 RLE, SLS 3 x 30 seconds1/25/21: SLS with vectors 3x5    Consulted and Agree with Plan of Care  Patient       Patient will benefit from skilled therapeutic intervention in order to improve the following deficits and impairments:  Abnormal gait, Decreased activity tolerance, Decreased balance, Decreased endurance, Decreased mobility, Decreased range of motion, Decreased strength, Difficulty walking, Increased muscle spasms, Impaired flexibility, Impaired tone, Improper body mechanics, Pain  Visit Diagnosis: Pain in right hip  Other abnormalities of gait and mobility  Muscle weakness (generalized)  Stiffness of right hip, not elsewhere classified     Problem List Patient Active Problem List   Diagnosis Date Noted  . Unilateral primary osteoarthritis, right hip 08/14/2018  . Epigastric discomfort 12/29/2016  . Dyslipidemia 01/13/2016  . Murmur 01/13/2016  . Essential hypertension 01/13/2016  . Hiatal hernia   . Dyspepsia 03/18/2014  . RLQ abdominal pain 08/08/2013  . 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 HYPOTHYROIDISM 11/05/2008  . Constipation 11/04/2008  . GERD 06/18/2008  . CHEST PAIN 06/18/2008    2:08 PM, 01/27/19 Mearl Latin PT, DPT Physical Therapist at [place], [place], [address] Phone: [phone]   Fax:   [phone]  Name: Eileen Green MRN: [id number] Date of Birth: 1950-07-19

## 2019-01-29 ENCOUNTER — Ambulatory Visit (HOSPITAL_COMMUNITY): Payer: Federal, State, Local not specified - PPO | Admitting: Physical Therapy

## 2019-01-29 ENCOUNTER — Other Ambulatory Visit: Payer: Self-pay

## 2019-01-29 ENCOUNTER — Other Ambulatory Visit: Payer: Self-pay | Admitting: Nurse Practitioner

## 2019-01-29 ENCOUNTER — Encounter (HOSPITAL_COMMUNITY): Payer: Self-pay | Admitting: Physical Therapy

## 2019-01-29 DIAGNOSIS — R2689 Other abnormalities of gait and mobility: Secondary | ICD-10-CM

## 2019-01-29 DIAGNOSIS — M25551 Pain in right hip: Secondary | ICD-10-CM | POA: Diagnosis not present

## 2019-01-29 DIAGNOSIS — M25651 Stiffness of right hip, not elsewhere classified: Secondary | ICD-10-CM

## 2019-01-29 DIAGNOSIS — M6281 Muscle weakness (generalized): Secondary | ICD-10-CM

## 2019-01-29 NOTE — Therapy (Signed)
[facility] [address] [place], [place], [address] Phone: [phone]   Fax:  [phone]  Physical Therapy Treatment  Patient Details  Name: Eileen Green MRN: [id number] Date of Birth: [birth year] Referring Provider (PT): Joni Fears MD  Progress Note Reporting Period 12/30/2018  to 01/29/2019  See note below for Objective Data and Assessment of Progress/Goals.      Encounter Date: 01/29/2019  PT End of Session - 01/29/19 1416    Visit Number  10    Number of Visits  14    Date for PT Re-Evaluation  02/10/19    Authorization Type  BCBS/Federal emp PPO (no auth required- 75 visit limit pt/ot/slp combined)    Authorization Time Period  12/30/18-02/10/19    Authorization - Visit Number  1    Authorization - Number of Visits  4    PT Start Time  1050    PT Stop Time  1137    PT Time Calculation (min)  47 min    Activity Tolerance  Patient tolerated treatment well    Behavior During Therapy  WFL for tasks assessed/performed       Past Medical History:  Diagnosis Date  . Arthritis   . Cancer ([facility])    skin melanoma under left eye  . Deaf    LEFT EAR  . GERD (gastroesophageal reflux disease)   . Hyperlipidemia Deaf in Left ear    Past Surgical History:  Procedure Laterality Date  . ABDOMINAL HYSTERECTOMY  2001   Partial  . COLONOSCOPY  01/21/2003   TRR:NHAFBX rectum/ Long capacious colon, moderate prep made the exam more challenging.  Marland Kitchen COLONOSCOPY  05/18/2009   Dr. Gala Romney: Normal rectum/narrow mouth left sided diverticula   . COLONOSCOPY N/A 05/14/2013   Dr. Gala Romney: hemorrhoids, colonic diverticula, redudant colon  . ESOPHAGOGASTRODUODENOSCOPY  01/21/2003   RMR:  Normal esophagus, stomach, D1 and D2  . ESOPHAGOGASTRODUODENOSCOPY N/A 03/24/2014   Dr. Gala Romney :Hiatal hernia; otherwise negative EGD  . TONSILLECTOMY      There were no vitals filed for this visit.  Subjective Assessment - 01/29/19 1423    Subjective  Pt  states that she does her exercises but feels that they are irritating more than helping.  PT is still having difficulty getting off the commode, putting shoes and socks on and washing between her toes.    Pertinent History  low back pain    How long can you sit comfortably?  unrestricted    How long can you stand comfortably?  30 minutes was  10 minutes    How long can you walk comfortably?  30 was 5 minutes    Patient Stated Goals  to be able to put her sock on without pain and not limp with walking.    Pain Onset  More than a month ago         [facility] PT Assessment - 01/29/19 0001      Assessment   Medical Diagnosis  R Hip Pain    Referring Provider (PT)  Joni Fears MD    Onset Date/Surgical Date  06/30/18    Next MD Visit  none scheduled    Prior Therapy  none      Precautions   Precautions  None      Restrictions   Weight Bearing Restrictions  No      Prior Function   Level of Independence  Independent      Cognition   Overall Cognitive  Status  Within Functional Limits for tasks assessed      Observation/Other Assessments   Observations  Patient ambulates without AD, moderate antalgic gait, decreased hip extension during toe R toe off, decreased stance time on RLE, trendelenberg RLE    Focus on Therapeutic Outcomes (FOTO)   41% limited now 43% limited      AROM   Overall AROM   Deficits;Due to pain    Overall AROM Comments  decreased R hip ROM with pain in all planes    Right Hip Flexion  95   was 90    Right Hip External Rotation   35   30   Right Hip Internal Rotation   -15   supine      PROM   Overall PROM   Deficits;Due to pain    Right Hip Flexion  105   was 92    Right Hip External Rotation   35    Right Hip Internal Rotation   --   lacking 10 in supine with hip at 90 degrees flexion     Strength   Right Hip Flexion  2/5   tested supine was tested sitting    Right Hip Extension  2/5    Right Hip ABduction  4-/5    Left Hip Flexion  4+/5    Left  Hip Extension  4/5    Left Hip ABduction  4/5    Right Knee Flexion  5/5    Right Knee Extension  5/5    Left Knee Flexion  5/5    Left Knee Extension  5/5    Right Ankle Dorsiflexion  5/5    Left Ankle Dorsiflexion  5/5      Palpation   Palpation comment  TTP in R glutes      Transfers   Comments  Uses hands for sit to stand transfers      Ambulation/Gait   Ambulation/Gait  Yes    Ambulation/Gait Assistance  7: Independent    Ambulation Distance (Feet)  500 Feet    Gait Pattern  Step-through pattern;Decreased stance time - right;Trendelenburg;Antalgic   decreased hip extension in stance to toe off, RLE is ER   Gait Comments  2 MWT                   [facility] Adult PT Treatment/Exercise - 01/29/19 0001      Exercises   Exercises  Lumbar      Lumbar Exercises: Stretches   Single Knee to Chest Stretch  Right;5 reps;10 seconds    Hip Flexor Stretch Limitations  modified thomas 2 x 20"  on RT     Other Lumbar Stretch Exercise  Legs straight toe touch then out as far as possible to improve motion       Lumbar Exercises: Supine   Clam  5 reps    Bent Knee Raise  5 reps    Bridge  5 reps      Lumbar Exercises: Sidelying   Clam  5 reps      Lumbar Exercises: Quadruped   Straight Leg Raise  --   attempted but unable due to lack of strength.              PT Short Term Goals - 01/29/19 1120      PT SHORT TERM GOAL #1   Title  Patient will be independent with HEP in order to optimize functional outcomes.    Time  3  Period  Weeks    Status  On-going    Target Date  01/20/19      PT SHORT TERM GOAL #2   Title  Patient will report 25% improvement in symptoms for improved function with ADL.    Time  3    Period  Weeks    Status  Not Met    Target Date  01/20/19        PT Long Term Goals - 01/29/19 1121      PT LONG TERM GOAL #1   Title  Patient will report at least 75% improvement in symptoms in order for improved quality of life.    Time  6     Period  Weeks    Status  Not Met      PT LONG TERM GOAL #2   Title  Patient will improve FOTO score by at least 10 points in order to demonstrate improved activity tolerance.    Time  6    Period  Weeks    Status  Not Met      PT LONG TERM GOAL #3   Title  Patient will be able to ambulate at least 450 feet during 2 MWT with improved gait pattern with minimally antalgic gait and improved hip extension ROM for ambulating in the community.    Time  6    Period  Weeks    Status  Achieved            Plan - 01/29/19 1424    Personal Factors and Comorbidities  Age;Past/Current Experience    Examination-Activity Limitations  Bathing;Bed Mobility;Bend;Caring for Others;Carry;Dressing;Locomotion Level;Squat;Stairs;Stand;Transfers    Examination-Participation Restrictions  Church;Cleaning;Community Activity;Driving;Laundry;Meal Prep;Shop;Volunteer;Yard Work    Stability/Clinical Decision Making  Stable/Uncomplicated    Rehab Potential  Good    PT Frequency  2x / week    PT Duration  6 weeks    PT Treatment/Interventions  ADLs/Self Care Home Management;Aquatic Therapy;Biofeedback;Electrical Stimulation;Cryotherapy;Iontophoresis 28m/ml Dexamethasone;Moist Heat;Traction;Ultrasound;DME Instruction;Gait training;Stair training;Functional mobility training;Therapeutic activities;Therapeutic exercise;Balance training;Neuromuscular re-education;Patient/family education;Orthotic Fit/Training;Manual techniques;Compression bandaging;Passive range of motion;Dry needling;Energy conservation;Splinting;Taping;Vasopneumatic Device;Spinal Manipulations;Joint Manipulations    PT Next Visit Plan  continue hip mobility exercises, continue hip and core strengthening and progress as tolerated    PT Home Exercise Plan  01/01/19 bridges 2x10, supine marches 2x10 01/06/19 sit to stand 01/08/19 clams 3x10, prone heel press together for hip external rotation 10x10 second holds 01/15/19 lateral stepping, step up 2x10  01/22/19: Lateral step up 2x10 RLE, SLS 3 x 30 seconds1/25/21: SLS with vectors 3x5    Consulted and Agree with Plan of Care  Patient       Patient will benefit from skilled therapeutic intervention in order to improve the following deficits and impairments:  Abnormal gait, Decreased activity tolerance, Decreased balance, Decreased endurance, Decreased mobility, Decreased range of motion, Decreased strength, Difficulty walking, Increased muscle spasms, Impaired flexibility, Impaired tone, Improper body mechanics, Pain  Visit Diagnosis: Pain in right hip  Other abnormalities of gait and mobility  Muscle weakness (generalized)  Stiffness of right hip, not elsewhere classified     Problem List Patient Active Problem List   Diagnosis Date Noted  . Unilateral primary osteoarthritis, right hip 08/14/2018  . Epigastric discomfort 12/29/2016  . Dyslipidemia 01/13/2016  . Murmur 01/13/2016  . Essential hypertension 01/13/2016  . Hiatal hernia   . Dyspepsia 03/18/2014  . RLQ abdominal pain 08/08/2013  . 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 HYPOTHYROIDISM 11/05/2008  . Constipation 11/04/2008  . GERD 06/18/2008  .  CHEST PAIN 06/18/2008   Rayetta Humphrey, PT CLT [phone] 01/29/2019, 2:25 PM  [facility] [address] [place], [place], [address] Phone: [phone]   Fax:  [phone]  Name: DILYNN MUNROE MRN: [id number] Date of Birth: January 11, 1950

## 2019-01-29 NOTE — Progress Notes (Signed)
Opened in errror 

## 2019-01-29 NOTE — Telephone Encounter (Signed)
Please tell the patient she has not been seen in over 2 years. I will send in a limited refill but will need a f/u appt for further refills.

## 2019-01-29 NOTE — Telephone Encounter (Signed)
Spoke with pt. Pt is aware that she needs an appointment for additional refills. Pt will call back to schedule.

## 2019-02-03 ENCOUNTER — Encounter (HOSPITAL_COMMUNITY): Payer: Self-pay | Admitting: Physical Therapy

## 2019-02-03 ENCOUNTER — Ambulatory Visit (HOSPITAL_COMMUNITY): Payer: Federal, State, Local not specified - PPO | Attending: Orthopaedic Surgery | Admitting: Physical Therapy

## 2019-02-03 ENCOUNTER — Other Ambulatory Visit: Payer: Self-pay

## 2019-02-03 DIAGNOSIS — M25551 Pain in right hip: Secondary | ICD-10-CM

## 2019-02-03 DIAGNOSIS — M6281 Muscle weakness (generalized): Secondary | ICD-10-CM | POA: Diagnosis present

## 2019-02-03 DIAGNOSIS — R2689 Other abnormalities of gait and mobility: Secondary | ICD-10-CM | POA: Insufficient documentation

## 2019-02-03 DIAGNOSIS — M25651 Stiffness of right hip, not elsewhere classified: Secondary | ICD-10-CM | POA: Diagnosis present

## 2019-02-03 NOTE — Therapy (Signed)
[facility] [place], [place], [address] Phone: [phone]   Fax:  [phone]  Physical Therapy Treatment  Patient Details  Name: Eileen Green MRN: [id number] Date of Birth: [birth year] Referring Provider (PT): Joni Fears MD   Encounter Date: 02/03/2019  PT End of Session - 02/03/19 1455    Visit Number  11    Number of Visits  14    Date for PT Re-Evaluation  02/10/19    Authorization Type  BCBS/Federal emp PPO (no auth required- 75 visit limit pt/ot/slp combined)    Authorization Time Period  12/30/18-02/10/19    Authorization - Visit Number  2    Authorization - Number of Visits  4    PT Start Time  1301    PT Stop Time  1351    PT Time Calculation (min)  50 min    Activity Tolerance  Patient tolerated treatment well    Behavior During Therapy  [facility] for tasks assessed/performed       Past Medical History:  Diagnosis Date  . Arthritis   . Cancer ([place])    skin melanoma under left eye  . Deaf    LEFT EAR  . GERD (gastroesophageal reflux disease)   . Hyperlipidemia Deaf in Left ear    Past Surgical History:  Procedure Laterality Date  . ABDOMINAL HYSTERECTOMY  2001   Partial  . COLONOSCOPY  01/21/2003   DJS:HFWYOV rectum/ Long capacious colon, moderate prep made the exam more challenging.  Marland Kitchen COLONOSCOPY  05/18/2009   Dr. Gala Romney: Normal rectum/narrow mouth left sided diverticula   . COLONOSCOPY N/A 05/14/2013   Dr. Gala Romney: hemorrhoids, colonic diverticula, redudant colon  . ESOPHAGOGASTRODUODENOSCOPY  01/21/2003   RMR:  Normal esophagus, stomach, D1 and D2  . ESOPHAGOGASTRODUODENOSCOPY N/A 03/24/2014   Dr. Gala Romney :Hiatal hernia; otherwise negative EGD  . TONSILLECTOMY      There were no vitals filed for this visit.  Subjective Assessment - 02/03/19 1302    Subjective  Patient reports that she believes her cortisone shot has definitely warn off. She sometimes feels that she is doing better but not always.  She continues to have difficulty with drying her toes.  She feels that she may need a hip replacement. She was upset that she had to wait to begin last session at a different time than was scheduled originally. She has been trying to do some of her exercises at home more.    Pertinent History  low back pain    How long can you sit comfortably?  unrestricted    How long can you stand comfortably?  30 minutes was  10 minutes    How long can you walk comfortably?  30 was 5 minutes    Patient Stated Goals  to be able to put her sock on without pain and not limp with walking.    Currently in Pain?  No/denies    Pain Onset  More than a month ago                               PT Education - 02/03/19 1448    Education Details  Review of HEP; Patient educated extensively on why her appointment was changed last week, progress made since beginning therapy, performance of HEP, proper mechanics and form of exercises, hip replacements, function and pain following hip replacements vs. conservative care, possible ROM/functional limitations that may not  improve, strength building process/time period, importance of completing home exercises consistently.    Person(s) Educated  Patient    Methods  Explanation    Comprehension  Verbalized understanding       PT Short Term Goals - 01/29/19 1120      PT SHORT TERM GOAL #1   Title  Patient will be independent with HEP in order to optimize functional outcomes.    Time  3    Period  Weeks    Status  On-going    Target Date  01/20/19      PT SHORT TERM GOAL #2   Title  Patient will report 25% improvement in symptoms for improved function with ADL.    Time  3    Period  Weeks    Status  Not Met    Target Date  01/20/19        PT Long Term Goals - 01/29/19 1121      PT LONG TERM GOAL #1   Title  Patient will report at least 75% improvement in symptoms in order for improved quality of life.    Time  6    Period  Weeks    Status   Not Met      PT LONG TERM GOAL #2   Title  Patient will improve FOTO score by at least 10 points in order to demonstrate improved activity tolerance.    Time  6    Period  Weeks    Status  Not Met      PT LONG TERM GOAL #3   Title  Patient will be able to ambulate at least 450 feet during 2 MWT with improved gait pattern with minimally antalgic gait and improved hip extension ROM for ambulating in the community.    Time  6    Period  Weeks    Status  Achieved            Plan - 02/03/19 1456    Clinical Impression Statement  Patient expresses her frustrations at beginning of session about appointment time being changed last week. Patient does not feel like she has made much progress because she has difficulty putting her socks on and she is educated on progress that she has made since beginning therapy. She has improved ability to ambulate, stand, and perform ADL since beginning therapy. She has many questions today about progress made, hip replacements, function and pain following hip replacement vs. conservative care, ability to improve hip strength, and hip arthritis. All of her questions are answered as best as able. She is also educated on performance of HEP, proper mechanics and form of exercises, hip replacements, function and pain following hip replacements vs. conservative care, possible ROM/functional limitations that may not improve, strength building process/time period, and importance of completing home exercises consistently. She states today's session was very helpful for her. She is going to consider if she thinks therapy is helpful for her or if she wants to stop coming and we will discuss that next week. Patient continues to show room for improvement and will continue to benefit from skilled physical therapy at this time.    Personal Factors and Comorbidities  Age;Past/Current Experience    Examination-Activity Limitations  Bathing;Bed Mobility;Bend;Caring for  Others;Carry;Dressing;Locomotion Level;Squat;Stairs;Stand;Transfers    Examination-Participation Restrictions  Church;Cleaning;Community Activity;Driving;Laundry;Meal Prep;Shop;Volunteer;Yard Work    Stability/Clinical Decision Making  Stable/Uncomplicated    Rehab Potential  Good    PT Frequency  2x / week    PT  Duration  6 weeks    PT Treatment/Interventions  ADLs/Self Care Home Management;Aquatic Therapy;Biofeedback;Electrical Stimulation;Cryotherapy;Iontophoresis 51m/ml Dexamethasone;Moist Heat;Traction;Ultrasound;DME Instruction;Gait training;Stair training;Functional mobility training;Therapeutic activities;Therapeutic exercise;Balance training;Neuromuscular re-education;Patient/family education;Orthotic Fit/Training;Manual techniques;Compression bandaging;Passive range of motion;Dry needling;Energy conservation;Splinting;Taping;Vasopneumatic Device;Spinal Manipulations;Joint Manipulations    PT Next Visit Plan  continue hip mobility exercises, continue hip and core strengthening and progress as tolerated    PT Home Exercise Plan  01/01/19 bridges 2x10, supine marches 2x10 01/06/19 sit to stand 01/08/19 clams 3x10, prone heel press together for hip external rotation 10x10 second holds 01/15/19 lateral stepping, step up 2x10 01/22/19: Lateral step up 2x10 RLE, SLS 3 x 30 seconds1/25/21: SLS with vectors 3x5    Consulted and Agree with Plan of Care  Patient       Patient will benefit from skilled therapeutic intervention in order to improve the following deficits and impairments:  Abnormal gait, Decreased activity tolerance, Decreased balance, Decreased endurance, Decreased mobility, Decreased range of motion, Decreased strength, Difficulty walking, Increased muscle spasms, Impaired flexibility, Impaired tone, Improper body mechanics, Pain  Visit Diagnosis: Pain in right hip  Other abnormalities of gait and mobility  Muscle weakness (generalized)  Stiffness of right hip, not elsewhere  classified     Problem List Patient Active Problem List   Diagnosis Date Noted  . Unilateral primary osteoarthritis, right hip 08/14/2018  . Epigastric discomfort 12/29/2016  . Dyslipidemia 01/13/2016  . Murmur 01/13/2016  . Essential hypertension 01/13/2016  . Hiatal hernia   . Dyspepsia 03/18/2014  . RLQ abdominal pain 08/08/2013  . 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 HYPOTHYROIDISM 11/05/2008  . Constipation 11/04/2008  . GERD 06/18/2008  . CHEST PAIN 06/18/2008    3:05 PM, 02/03/19 AMearl LatinPT, DPT [facility] [place] [address]Phone: [phone]  Fax:  [phone] Name: INATASH BERMANMRN: [id number] of Birth: 402-Oct-1952

## 2019-02-05 ENCOUNTER — Ambulatory Visit (HOSPITAL_COMMUNITY): Payer: Federal, State, Local not specified - PPO | Admitting: Physical Therapy

## 2019-02-10 ENCOUNTER — Other Ambulatory Visit: Payer: Self-pay

## 2019-02-10 ENCOUNTER — Ambulatory Visit (HOSPITAL_COMMUNITY): Payer: Federal, State, Local not specified - PPO | Admitting: Physical Therapy

## 2019-02-10 ENCOUNTER — Encounter (HOSPITAL_COMMUNITY): Payer: Self-pay | Admitting: Physical Therapy

## 2019-02-10 DIAGNOSIS — M6281 Muscle weakness (generalized): Secondary | ICD-10-CM

## 2019-02-10 DIAGNOSIS — M25551 Pain in right hip: Secondary | ICD-10-CM

## 2019-02-10 DIAGNOSIS — R2689 Other abnormalities of gait and mobility: Secondary | ICD-10-CM

## 2019-02-10 DIAGNOSIS — M25651 Stiffness of right hip, not elsewhere classified: Secondary | ICD-10-CM

## 2019-02-10 NOTE — Therapy (Signed)
[place], [place], [address] Phone: [phone]   Fax:  [phone]  Physical Therapy Treatment/ progress note/ Discharge summary  Patient Details  Name: Eileen Green MRN: [id number] Date of Birth: [birth year] Referring Provider (PT): Joni Fears MD   Encounter Date: 02/10/2019  Progress Note   Reporting Period 12/30/18 to 02/10/19   See note below for Objective Data and Assessment of Progress/Goals  PHYSICAL THERAPY DISCHARGE SUMMARY  Visits from Start of Care: 12  Current functional level related to goals / functional outcomes: See below   Remaining deficits: See below   Education / Equipment: See below  Plan: Patient agrees to discharge.  Patient goals were partially met. Patient is being discharged due to the patient's request.  ?????having extensive HEP       PT End of Session - 02/10/19 1329    Visit Number  12    Number of Visits  14    Date for PT Re-Evaluation  02/10/19    Authorization Type  BCBS/Federal emp PPO (no auth required- 75 visit limit pt/ot/slp combined)    Authorization Time Period  12/30/18-02/10/19    Authorization - Visit Number  3    Authorization - Number of Visits  4    PT Start Time  9371    PT Stop Time  1345    PT Time Calculation (min)  42 min    Activity Tolerance  Patient tolerated treatment well    Behavior During Therapy  WFL for tasks assessed/performed       Past Medical History:  Diagnosis Date  . Arthritis   . Cancer ([place])    skin melanoma under left eye  . Deaf    LEFT EAR  . GERD (gastroesophageal reflux disease)   . Hyperlipidemia Deaf in Left ear    Past Surgical History:  Procedure Laterality Date  . ABDOMINAL HYSTERECTOMY  2001   Partial  . COLONOSCOPY  01/21/2003   IRC:VELFYB rectum/ Long capacious colon, moderate prep made the exam more challenging.  Marland Kitchen COLONOSCOPY  05/18/2009   Dr. Gala Romney: Normal rectum/narrow mouth left sided  diverticula   . COLONOSCOPY N/A 05/14/2013   Dr. Gala Romney: hemorrhoids, colonic diverticula, redudant colon  . ESOPHAGOGASTRODUODENOSCOPY  01/21/2003   RMR:  Normal esophagus, stomach, D1 and D2  . ESOPHAGOGASTRODUODENOSCOPY N/A 03/24/2014   Dr. Gala Romney :Hiatal hernia; otherwise negative EGD  . TONSILLECTOMY      There were no vitals filed for this visit.  Subjective Assessment - 02/10/19 1303    Subjective  Patient reports she is not walking as well today. She got really sore on Saturday from being sedentary. She has been completing exercises. Sometimes she feels that she has improved but other days it's not as good. She has some questions about clarifying her exercises that she is doing at home. She feels about 15% better and she is more aware of how she is doing things.    Pertinent History  low back pain    How long can you sit comfortably?  unrestricted    How long can you stand comfortably?  30 minutes was  10 minutes    How long can you walk comfortably?  30 was 5 minutes    Patient Stated Goals  to be able to put her sock on without pain and not limp with walking.    Currently in Pain?  Yes    Pain Score  1     Pain  Onset  More than a month ago         [facility] PT Assessment - 02/10/19 0001      Assessment   Medical Diagnosis  R Hip Pain    Referring Provider (PT)  Joni Fears MD    Onset Date/Surgical Date  06/30/18    Next MD Visit  End of March    Prior Therapy  none      Precautions   Precautions  None      Restrictions   Weight Bearing Restrictions  No      Prior Function   Level of Independence  Independent      Cognition   Overall Cognitive Status  Within Functional Limits for tasks assessed      Observation/Other Assessments   Observations  Patient ambulates without AD, moderate antalgic gait, decreased hip extension during toe R toe off, decreased stance time on RLE, trendelenberg RLE    Focus on Therapeutic Outcomes (FOTO)   41% limited now 43% limited       AROM   Overall AROM   Deficits;Due to pain    Overall AROM Comments  decreased R hip ROM with pain in all planes    Right Hip Flexion  95    Right Hip External Rotation   35    Right Hip Internal Rotation   -15      PROM   Overall PROM   Deficits;Due to pain    Right Hip Flexion  105    Right Hip External Rotation   35    Right Hip Internal Rotation   --   lacking 10 in supine with hip at 90 degrees flexion     Strength   Right Hip Flexion  2/5    Right Hip Extension  2/5    Right Hip ABduction  4-/5    Left Hip Flexion  4+/5    Left Hip Extension  4/5    Left Hip ABduction  4/5    Right Knee Flexion  5/5    Right Knee Extension  5/5    Left Knee Flexion  5/5    Left Knee Extension  5/5    Right Ankle Dorsiflexion  5/5    Left Ankle Dorsiflexion  5/5      Transfers   Comments  Uses hands for sit to stand transfers                           PT Education - 02/10/19 1458    Education Details  Review of HEP, Educated on performance of HEP, progressing HEP, returning if needed to therapy, proper mechanics with exercise, increasing activity.    Person(s) Educated  Patient    Methods  Explanation    Comprehension  Verbalized understanding       PT Short Term Goals - 02/10/19 1338      PT SHORT TERM GOAL #1   Title  Patient will be independent with HEP in order to optimize functional outcomes.    Time  3    Period  Weeks    Status  Achieved    Target Date  01/20/19      PT SHORT TERM GOAL #2   Title  Patient will report 25% improvement in symptoms for improved function with ADL.    Baseline  15% today    Time  3    Period  Weeks    Status  Not Met  Target Date  01/20/19        PT Long Term Goals - 01/29/19 1121      PT LONG TERM GOAL #1   Title  Patient will report at least 75% improvement in symptoms in order for improved quality of life.    Time  6    Period  Weeks    Status  Not Met      PT LONG TERM GOAL #2   Title  Patient  will improve FOTO score by at least 10 points in order to demonstrate improved activity tolerance.    Time  6    Period  Weeks    Status  Not Met      PT LONG TERM GOAL #3   Title  Patient will be able to ambulate at least 450 feet during 2 MWT with improved gait pattern with minimally antalgic gait and improved hip extension ROM for ambulating in the community.    Time  6    Period  Weeks    Status  Achieved            Plan - 02/10/19 1500    Clinical Impression Statement  Patient has met 1/2 short term goals and 1/3 long term goals at this time due to improved walking and performance of HEP. She demonstrates improving ROM, strength, activity tolerance, endurance, and functional mobility. She continues to have deficits in strength, ROM, functional mobility, activity tolerance, and pain. She is educated extensively on completion of HEP and discussed all exercises with her as well as proper form and mechanics with ADL/exercise. Patient educated on returning to therapy in the future if needed. Patient discharged from therapy at this time.    Personal Factors and Comorbidities  Age;Past/Current Experience    Examination-Activity Limitations  Bathing;Bed Mobility;Bend;Caring for Others;Carry;Dressing;Locomotion Level;Squat;Stairs;Stand;Transfers    Examination-Participation Restrictions  Church;Cleaning;Community Activity;Driving;Laundry;Meal Prep;Shop;Volunteer;Yard Work    Stability/Clinical Decision Making  --    Rehab Potential  Good    PT Frequency  --    PT Duration  --    PT Treatment/Interventions  ADLs/Self Care Home Management;Aquatic Therapy;Biofeedback;Electrical Stimulation;Cryotherapy;Iontophoresis 85m/ml Dexamethasone;Moist Heat;Traction;Ultrasound;DME Instruction;Gait training;Stair training;Functional mobility training;Therapeutic activities;Therapeutic exercise;Balance training;Neuromuscular re-education;Patient/family education;Orthotic Fit/Training;Manual  techniques;Compression bandaging;Passive range of motion;Dry needling;Energy conservation;Splinting;Taping;Vasopneumatic Device;Spinal Manipulations;Joint Manipulations    PT Next Visit Plan  n/a    PT Home Exercise Plan  01/01/19 bridges 2x10, supine marches 2x10 01/06/19 sit to stand 01/08/19 clams 3x10, prone heel press together for hip external rotation 10x10 second holds 01/15/19 lateral stepping, step up 2x10 01/22/19: Lateral step up 2x10 RLE, SLS 3 x 30 seconds1/25/21: SLS with vectors 3x5    Consulted and Agree with Plan of Care  Patient       Patient will benefit from skilled therapeutic intervention in order to improve the following deficits and impairments:  Abnormal gait, Decreased activity tolerance, Decreased balance, Decreased endurance, Decreased mobility, Decreased range of motion, Decreased strength, Difficulty walking, Increased muscle spasms, Impaired flexibility, Impaired tone, Improper body mechanics, Pain  Visit Diagnosis: Pain in right hip  Other abnormalities of gait and mobility  Muscle weakness (generalized)  Stiffness of right hip, not elsewhere classified     Problem List Patient Active Problem List   Diagnosis Date Noted  . Unilateral primary osteoarthritis, right hip 08/14/2018  . Epigastric discomfort 12/29/2016  . Dyslipidemia 01/13/2016  . Murmur 01/13/2016  . Essential hypertension 01/13/2016  . Hiatal hernia   . Dyspepsia 03/18/2014  . RLQ abdominal pain 08/08/2013  .  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 HYPOTHYROIDISM 11/05/2008  . Constipation 11/04/2008  . GERD 06/18/2008  . CHEST PAIN 06/18/2008    3:11 PM, 02/10/19 Mearl Latin PT, DPT Physical Therapist at [place], [place], [address] Phone: [phone]   Fax:  [phone]  Name: Eileen Green MRN: [id number] Date of Birth: 1951/01/02

## 2019-02-12 ENCOUNTER — Ambulatory Visit (HOSPITAL_COMMUNITY): Payer: Federal, State, Local not specified - PPO | Admitting: Physical Therapy

## 2019-03-05 ENCOUNTER — Encounter: Payer: Self-pay | Admitting: Gastroenterology

## 2019-03-05 ENCOUNTER — Ambulatory Visit: Payer: Federal, State, Local not specified - PPO | Admitting: Gastroenterology

## 2019-03-05 ENCOUNTER — Other Ambulatory Visit: Payer: Self-pay

## 2019-03-05 VITALS — BP 132/78 | HR 84 | Temp 97.3°F | Ht 65.0 in | Wt 187.0 lb

## 2019-03-05 DIAGNOSIS — K219 Gastro-esophageal reflux disease without esophagitis: Secondary | ICD-10-CM | POA: Diagnosis not present

## 2019-03-05 DIAGNOSIS — K449 Diaphragmatic hernia without obstruction or gangrene: Secondary | ICD-10-CM | POA: Diagnosis not present

## 2019-03-05 DIAGNOSIS — R1013 Epigastric pain: Secondary | ICD-10-CM | POA: Diagnosis not present

## 2019-03-05 DIAGNOSIS — R1319 Other dysphagia: Secondary | ICD-10-CM

## 2019-03-05 DIAGNOSIS — R1310 Dysphagia, unspecified: Secondary | ICD-10-CM

## 2019-03-05 MED ORDER — PANTOPRAZOLE SODIUM 40 MG PO TBEC: 40.0000 mg | DELAYED_RELEASE_TABLET | Freq: Every morning | ORAL | 3 refills | Status: DC

## 2019-03-05 NOTE — Assessment & Plan Note (Signed)
>>  ASSESSMENT AND PLAN FOR GERD WRITTEN ON 03/05/2019 11:52 AM BY LEWIS, LESLIE S, PA-C  Patient presenting for 2-year follow-up of chronic GERD.  Typical symptoms well controlled on pantoprazole 40.  In the past couple of months she has had several episodes while eating, sensation of food sticking in the chest.  Sometimes she notes that after a meal.  Usually eating again will make it go away.  Happen 1 time after missing pantoprazole for single day.  She has a history of small hiatal hernia.  Last EGD 2016 fairly unremarkable.  We discussed options of repeating EGD versus barium study.  Advantages of barium study allowing for noninvasive test to evaluate hiatal hernia, grossly evaluate esophageal motility, as well as for esophageal stricture.  If any abnormalities, next step would be EGD.  She will continue pantoprazole 40 mg daily.  We will plan to see her back for routine follow-up in 2 years or sooner if needed.  Next colonoscopy due in May 2025.

## 2019-03-05 NOTE — Progress Notes (Signed)
Primary Care Physician: Asencion Noble, MD  Primary Gastroenterologist:  Garfield Cornea, MD   Chief Complaint  Patient presents with  . Gastroesophageal Reflux    will have an episode about once a month where she has a "knot" in her chest and food feels like it won't go down    HPI: Eileen Green is a 69 y.o. female here for follow-up of reflux.  Last seen in December 2018.  Last EGD in 2016 due to early satiety dyspepsia.  Overall unremarkable.  Colonoscopy completed in 2015.  Next colonoscopy due in May 2025.  Overall patient has been doing fairly well.  Over the past several months she has noted episodes 1-2 times a month where she feels like food is sitting in the lower part of her chest creating discomfort.  Eventually it goes away.  Notices it after meals, not any particular foods.  First episode occurred after missing a single dose of pantoprazole.  She also has some discomfort in the epigastric region.  Generally associated with meals.  No nausea or vomiting.  Movements are regular.  No blood in stool or melena.  Appetite good.  No heartburn.  No unintentional weight loss.   Current Outpatient Medications  Medication Sig Dispense Refill  . Carboxymethylcellulose Sodium (THERATEARS OP) Apply to eye as needed.    . ezetimibe (ZETIA) 10 MG tablet TAKE 1 TABLET(10 MG) BY MOUTH DAILY 30 tablet 11  . Glucosamine-Chondroitin (OSTEO BI-FLEX REGULAR STRENGTH PO) Take by mouth.    . Lactobacillus (DIGESTIVE HEALTH PROBIOTIC PO) Take 1 tablet by mouth daily.     . Methylcellulose, Laxative, 500 MG TABS Take 2 tablets by mouth daily.     . Misc Natural Products (OSTEO BI-FLEX TRIPLE STRENGTH PO) Take by mouth daily.      . Multiple Vitamin (MULTIVITAMIN) capsule Take 0.5 capsules by mouth every other day.     . pantoprazole (PROTONIX) 40 MG tablet TAKE 1 TABLET BY MOUTH EVERY MORNING 90 tablet 0  . polyethylene glycol (MIRALAX / GLYCOLAX) packet Take 17 g by mouth daily.    .  rosuvastatin (CRESTOR) 5 MG tablet Take 1 tablet by mouth once weekly. 15 tablet 3  . SYNTHROID 50 MCG tablet Take 1 tablet (50 mcg total) by mouth daily. 30 tablet 11  . valsartan (DIOVAN) 320 MG tablet Take 1 tablet by mouth daily.     No current facility-administered medications for this visit.    Allergies as of 03/05/2019  . (No Known Allergies)    ROS:  General: Negative for anorexia, weight loss, fever, chills, fatigue, weakness. ENT: Negative for hoarseness, nasal congestion.  See HPI CV: Negative for chest pain, angina, palpitations, dyspnea on exertion, peripheral edema.  Respiratory: Negative for dyspnea at rest, dyspnea on exertion, cough, sputum, wheezing.  GI: See history of present illness. GU:  Negative for dysuria, hematuria, urinary incontinence, urinary frequency, nocturnal urination.  Endo: Negative for unusual weight change.    Physical Examination:   BP 132/78   Pulse 84   Temp (!) 97.3 F (36.3 C) (Oral)   Ht 5\' 5"  (1.651 m)   Wt 187 lb (84.8 kg)   BMI 31.12 kg/m   General: Well-nourished, well-developed in no acute distress.  Eyes: No icterus. Mouth: masked Lungs: Clear to auscultation bilaterally.  Heart: Regular rate and rhythm, no murmurs rubs or gallops.  Abdomen: Bowel sounds are normal, mild epigastric discomfort, nondistended, no hepatosplenomegaly or masses, no abdominal bruits or hernia ,  no rebound or guarding.   Extremities: No lower extremity edema. No clubbing or deformities. Neuro: Alert and oriented x 4   Skin: Warm and dry, no jaundice.   Psych: Alert and cooperative, normal mood and affect.   Imaging Studies: No results found.

## 2019-03-05 NOTE — Patient Instructions (Signed)
1. Continue pantoprazole 40mg  daily before breakfast.  2. We will call you with results of your barium study once available. 3. Return to the office for follow up of GERD in two years.  4. Next colonoscopy due in 2025.

## 2019-03-05 NOTE — Assessment & Plan Note (Signed)
Patient presenting for 2-year follow-up of chronic GERD.  Typical symptoms well controlled on pantoprazole 40.  In the past couple of months she has had several episodes while eating, sensation of food sticking in the chest.  Sometimes she notes that after a meal.  Usually eating again will make it go away.  Happen 1 time after missing pantoprazole for single day.  She has a history of small hiatal hernia.  Last EGD 2016 fairly unremarkable.  We discussed options of repeating EGD versus barium study.  Advantages of barium study allowing for noninvasive test to evaluate hiatal hernia, grossly evaluate esophageal motility, as well as for esophageal stricture.  If any abnormalities, next step would be EGD.  She will continue pantoprazole 40 mg daily.  We will plan to see her back for routine follow-up in 2 years or sooner if needed.  Next colonoscopy due in May 2025.

## 2019-03-12 ENCOUNTER — Other Ambulatory Visit (HOSPITAL_COMMUNITY): Payer: Federal, State, Local not specified - PPO

## 2019-03-12 ENCOUNTER — Other Ambulatory Visit: Payer: Self-pay

## 2019-03-12 ENCOUNTER — Telehealth: Payer: Self-pay | Admitting: Internal Medicine

## 2019-03-12 ENCOUNTER — Ambulatory Visit (HOSPITAL_COMMUNITY)
Admission: RE | Admit: 2019-03-12 | Discharge: 2019-03-12 | Disposition: A | Discharge status: Home / Self Care | Payer: Federal, State, Local not specified - PPO | Source: Ambulatory Visit | Attending: Gastroenterology | Admitting: Gastroenterology

## 2019-03-12 DIAGNOSIS — R1310 Dysphagia, unspecified: Secondary | ICD-10-CM | POA: Insufficient documentation

## 2019-03-12 DIAGNOSIS — K219 Gastro-esophageal reflux disease without esophagitis: Secondary | ICD-10-CM | POA: Diagnosis not present

## 2019-03-12 DIAGNOSIS — R1013 Epigastric pain: Secondary | ICD-10-CM | POA: Insufficient documentation

## 2019-03-12 DIAGNOSIS — K449 Diaphragmatic hernia without obstruction or gangrene: Secondary | ICD-10-CM | POA: Insufficient documentation

## 2019-03-12 DIAGNOSIS — R1319 Other dysphagia: Secondary | ICD-10-CM

## 2019-03-12 NOTE — Telephone Encounter (Signed)
Spoke with pt. Pt said after having her procedure this morning, she felt like she needed to have a BM. Pt went home and had a hug BM. This afternoon, pt started having a little rt side abdominal pain and states it felt a little hard to get up out of her chair. Pain has eased up prior to me calling pt. Pt was advised to monitor her symptoms. If pt starts vomiting uncontrollable, severe pain ect, she is to go to the EG. Discussed recommendations with Eg. Pt will call back if needed. Pt had no nausea, no vomiting, no sob when speaking with her.

## 2019-03-12 NOTE — Telephone Encounter (Signed)
Pt had a barium pill this morning and wanted to speak with the nurse about how is she suppose to be feeling. [phone]

## 2019-03-17 ENCOUNTER — Telehealth: Payer: Self-pay | Admitting: *Deleted

## 2019-03-17 NOTE — Telephone Encounter (Signed)
Pt called for DG UGI results.  Informed pt that I would be in contact with her after LSL's review.  Pt voiced understanding.

## 2019-03-18 NOTE — Telephone Encounter (Signed)
See result note.  

## 2019-03-25 ENCOUNTER — Telehealth: Payer: Self-pay | Admitting: Internal Medicine

## 2019-03-25 ENCOUNTER — Ambulatory Visit: Payer: Federal, State, Local not specified - PPO | Admitting: Orthopaedic Surgery

## 2019-03-25 ENCOUNTER — Encounter: Payer: Self-pay | Admitting: Orthopaedic Surgery

## 2019-03-25 ENCOUNTER — Other Ambulatory Visit: Payer: Self-pay

## 2019-03-25 VITALS — Ht 65.5 in | Wt 186.0 lb

## 2019-03-25 DIAGNOSIS — M1611 Unilateral primary osteoarthritis, right hip: Secondary | ICD-10-CM

## 2019-03-25 NOTE — Telephone Encounter (Signed)
Pt woke up with pain in her chest around 2:20 AM and it didn't go away until pt ate a piece of toast this morning. Pt is taking Pantoprazole and chews tums as needed.  Pt had fried food for lunch and repeated fried foods with left overs last night. Pt isn't wasn't sure if she was having a heart attack or Gerd symptoms. Pt is aware that fired foods can flare pts Gerd symptoms. Pt reported no neck pain, no arm pain, no sweating, no n/v, no heart palpatations. Pt is aware that if she is ever in doubt and feels any of the symptoms listed above, she should go to the ED

## 2019-03-25 NOTE — Progress Notes (Signed)
Office Visit Note   Patient: Eileen Green           Date of Birth: [birth year]           MRN: [id number] Visit Date: 03/25/2019              Requested by: Asencion Noble, MD [address] [place],   [address] PCP: Asencion Noble, MD   Assessment & Plan: Visit Diagnoses:  1. Unilateral primary osteoarthritis, right hip     Plan: Long discussion with [name] regarding the osteoarthritis of her right hip.  She has had the issue for a long time and has been through a full course of physical therapy and had intra-articular cortisone by Dr. Ernestina Patches.  Still having quite a bit of trouble and compromise of her activities.  I think she is a good candidate for right total hip replacement.  Furthermore, I think she be better off approach from an anterior incision.  I will refer her to Dr. Ninfa Linden and in the meantime obtain medical clearance from Dr. Ria Comment office.  Office visit over 30 minutes 50% of time in counseling  Follow-Up Instructions: Return Refer to Dr. Ninfa Linden for anterior approach hip replacement.   Orders:  Orders Placed This Encounter  Procedures  . Ambulatory referral to Orthopedic Surgery   No orders of the defined types were placed in this encounter.     Procedures: No procedures performed   Clinical Data: No additional findings.   Subjective: Chief Complaint  Patient presents with  . Right Hip - Follow-up, Pain  Patient presents today for a three month follow up on her right hip. She had an injection with Dr.Newton on 12/31/2018 and has been going to physical therapy. She said that she is slow at walking and limps. She has a difficult time getting up and down. She said that therapy helped with her muscles, but irritated her joint. She injection helped, but states that she worked the injection out the following day in therapy. She is not taking anything for pain. She saw a chiropractor and was told she should go ahead with replacement sooner, rather than  later.  HPI  Review of Systems  Constitutional: Negative for fatigue.  HENT: Negative for ear pain.   Eyes: Negative for pain.  Respiratory: Negative for shortness of breath.   Cardiovascular: Negative for leg swelling.  Gastrointestinal: Negative for constipation and diarrhea.  Endocrine: Negative for cold intolerance and heat intolerance.  Genitourinary: Negative for difficulty urinating.  Musculoskeletal: Negative for joint swelling.  Skin: Negative for rash.  Allergic/Immunologic: Negative for food allergies.  Neurological: Negative for weakness.  Hematological: Does not bruise/bleed easily.  Psychiatric/Behavioral: Positive for sleep disturbance.     Objective: Vital Signs: Ht 5' 5.5" (1.664 m)   Wt 186 lb (84.4 kg)   BMI 30.48 kg/m   Physical Exam Constitutional:      Appearance: She is well-developed.  Eyes:     Pupils: Pupils are equal, round, and reactive to light.  Pulmonary:     Effort: Pulmonary effort is normal.  Skin:    General: Skin is warm and dry.  Neurological:     Mental Status: She is alert and oriented to person, place, and time.  Psychiatric:        Behavior: Behavior normal.     Ortho Exam awake alert and oriented x3.  Comfortable sitting.  Does have a limp when she first gets up from a sitting position referable to her  right hip.  Very minimal motion in her right hip from neutral position with only about 5 degrees of internal and external rotation.  Left hip had at least 30 to 40 degrees of both internal and external rotation.  No distal edema.  Motor and sensory exam intact  Specialty Comments:  No specialty comments available.  Imaging: No results found.   PMFS History: Patient Active Problem List   Diagnosis Date Noted  . Dysphagia 03/05/2019  . Abdominal pain, epigastric 03/05/2019  . Unilateral primary osteoarthritis, right hip 08/14/2018  . Epigastric discomfort 12/29/2016  . Dyslipidemia 01/13/2016  . Murmur 01/13/2016  .  Essential hypertension 01/13/2016  . Hiatal hernia   . Dyspepsia 03/18/2014  . RLQ abdominal pain 08/08/2013  . 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 HYPOTHYROIDISM 11/05/2008  . Constipation 11/04/2008  . GERD 06/18/2008  . CHEST PAIN 06/18/2008   Past Medical History:  Diagnosis Date  . Arthritis   . Cancer ([place])    skin melanoma under left eye  . Deaf    LEFT EAR  . GERD (gastroesophageal reflux disease)   . Hyperlipidemia Deaf in Left ear    Family History  Problem Relation Age of Onset  . Heart disease Mother   . Dementia Mother   . Heart disease Father   . Kidney disease Father   . Thyroid nodules Father   . Colon cancer Neg Hx     Past Surgical History:  Procedure Laterality Date  . ABDOMINAL HYSTERECTOMY  2001   Partial  . COLONOSCOPY  01/21/2003   [id number] rectum/ Long capacious colon, moderate prep made the exam more challenging.  Marland Kitchen COLONOSCOPY  05/18/2009   Dr. Gala Romney: Normal rectum/narrow mouth left sided diverticula   . COLONOSCOPY N/A 05/14/2013   Dr. Gala Romney: hemorrhoids, colonic diverticula, redudant colon  . ESOPHAGOGASTRODUODENOSCOPY  01/21/2003   RMR:  Normal esophagus, stomach, D1 and D2  . ESOPHAGOGASTRODUODENOSCOPY N/A 03/24/2014   Dr. Gala Romney :Hiatal hernia; otherwise negative EGD  . TONSILLECTOMY     Social History   Occupational History  . 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 Smoking status: Never Smoker  . Smokeless tobacco: Never Used  . Tobacco comment: Never smoked  Substance and Sexual Activity  . Alcohol use: No  . Drug use: No  . Sexual activity: Not on file

## 2019-03-25 NOTE — Telephone Encounter (Signed)
Pt called with questions about how do you know the difference between acid reflux and having a heart attack. Please advise. [phone]

## 2019-03-26 ENCOUNTER — Encounter: Payer: Self-pay | Admitting: Physician Assistant

## 2019-03-26 ENCOUNTER — Ambulatory Visit: Payer: Self-pay

## 2019-03-26 ENCOUNTER — Ambulatory Visit: Payer: Federal, State, Local not specified - PPO | Admitting: Physician Assistant

## 2019-03-26 VITALS — Ht 65.5 in | Wt 186.6 lb

## 2019-03-26 DIAGNOSIS — M1611 Unilateral primary osteoarthritis, right hip: Secondary | ICD-10-CM | POA: Diagnosis not present

## 2019-03-26 NOTE — Telephone Encounter (Signed)
Left a message on pts Hm and Cell. Waiting on a return call.

## 2019-03-26 NOTE — Telephone Encounter (Signed)
Patient should be on pantoprazole BID before meals. Make sure she is as we just recently increased this. She needs to avoid fried foods, spicy foods, etc.   Please see if she is still having any chest pain etc. If so, she should go to ED. If no chest pain, she should consider checking in with her PCP for them to consider cardiac evaluation.   If she had any pain in the upper abdomen with episodes, we could offer ruq u/s to evaluate gallbladder but I suspect symptoms could have been gerd. She has a good size hiatal hernia with reflux all the way up the esophagus.

## 2019-03-26 NOTE — Progress Notes (Signed)
Office Visit Note   Patient: Eileen Green           Date of Birth: 04/02/1950           MRN: [id number] Visit Date: 03/26/2019              Requested by: Garald Balding, MD [address] [place],  [place] [address] PCP: Asencion Noble, MD   Assessment & Plan: Visit Diagnoses:  1. Unilateral primary osteoarthritis, right hip     Plan: Due to the fact the patient is failed conservative treatments and is still significant pain and decreased range of motion is affecting her activities of daily living in regards to her right hip recommend right total hip arthroplasty given her near end-stage arthritis.  She has failed physical therapy medications and intra-articular injections of the right hip.  Discussed with her at length right total hip arthroplasty surgery discussed the risk benefits of surgery.  Risk include but are not limited to dislocation: DVT/PE: Blood loss, infection, nerve or vessel injury.  Postoperative protocol was discussed with patient.  Due to patient's recent reflux versus chest pain she will see her cardiologist needs clearance prior to proceeding with surgery.  We will give her Samella Parr card so she can call Judeen Hammans when she has clearance from her cardiologist.  Surgical sheet was filled out today.  Handout on anterior hip arthroplasty was given.  Questions were encouraged and answered by Dr. Ninfa Linden myself.  Follow-Up Instructions: Return 2 weeks post op.   Orders:  Orders Placed This Encounter  Procedures  . XR HIP UNILAT W OR W/O PELVIS 2-3 VIEWS RIGHT   No orders of the defined types were placed in this encounter.     Procedures: No procedures performed   Clinical Data: No additional findings.   Subjective: Chief Complaint  Patient presents with  . Right Hip - Pain    HPI Eileen Green comes in today referred by Dr. Durward Fortes for right hip pain.  She is sent to discuss right total hip arthroplasty.  States her pain began about 6 months ago  without injury.  Pain is gradually getting worse.  Pain is located primarily in the right groin.  She has difficulty sleeping due to the pain.  She notes decreased range of motion and this is her main concern.  States her pain is 4-5 out of 10 pain worse.  She did have 2 injections which were very short-lived.  She went to physical therapy for 6 weeks for her hip.  She also sought out a chiropractor who told her that she needed to have a hip replacement.  She is having problems donning her shoes and socks. Patient reports few nights ago that she had some chest pain that awakened her.  She is unsure if this was due to reflux or possibly cardiac in nature.  She did see her cardiologist Dr. Debara Pickett back in the fall had an EKG and has had no other symptoms of chest pain.  She denies any nausea arm pain or sweating at the time of the chest pain episode a few nights ago.  Review of Systems  Constitutional: Negative for chills and fever.  Respiratory: Negative for shortness of breath.   Cardiovascular: Positive for chest pain.  Musculoskeletal: Positive for gait problem.     Objective: Vital Signs: Ht 5' 5.5" (1.664 m)   Wt 186 lb 9.6 oz (84.6 kg)   BMI 30.58 kg/m   Physical Exam Constitutional:  Appearance: She is not ill-appearing or diaphoretic.  Pulmonary:     Effort: Pulmonary effort is normal.  Neurological:     Mental Status: She is alert and oriented to person, place, and time.  Psychiatric:        Mood and Affect: Mood normal.     Ortho Exam Limited internal and external rotation of the right hip with pain with internal rotation.  Left hip good range of motion without pain.  Walks with antalgic gait.  Bilateral calf supple nontender.  Dorsiflexion plantarflexion bilateral ankles intact. Specialty Comments:  No specialty comments available.  Imaging: XR HIP UNILAT W OR W/O PELVIS 2-3 VIEWS RIGHT  Result Date: 03/26/2019 AP pelvis lateral view right hip: Bilateral hips well  located.  Right hip with near end-stage arthritis.  Left hip mild early arthritic changes joint space overall well-maintained.  No acute fractures no bony abnormalities otherwise.    PMFS History: Patient Active Problem List   Diagnosis Date Noted  . Dysphagia 03/05/2019  . Abdominal pain, epigastric 03/05/2019  . Unilateral primary osteoarthritis, right hip 08/14/2018  . Epigastric discomfort 12/29/2016  . Dyslipidemia 01/13/2016  . Murmur 01/13/2016  . Essential hypertension 01/13/2016  . Hiatal hernia   . Dyspepsia 03/18/2014  . RLQ abdominal pain 08/08/2013  . 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 HYPOTHYROIDISM 11/05/2008  . Constipation 11/04/2008  . GERD 06/18/2008  . CHEST PAIN 06/18/2008   Past Medical History:  Diagnosis Date  . Arthritis   . Cancer ([place])    skin melanoma under left eye  . Deaf    LEFT EAR  . GERD (gastroesophageal reflux disease)   . Hyperlipidemia Deaf in Left ear    Family History  Problem Relation Age of Onset  . Heart disease Mother   . Dementia Mother   . Heart disease Father   . Kidney disease Father   . Thyroid nodules Father   . Colon cancer Neg Hx     Past Surgical History:  Procedure Laterality Date  . ABDOMINAL HYSTERECTOMY  2001   Partial  . COLONOSCOPY  01/21/2003   [id number] rectum/ Long capacious colon, moderate prep made the exam more challenging.  Marland Kitchen COLONOSCOPY  05/18/2009   Dr. Gala Romney: Normal rectum/narrow mouth left sided diverticula   . COLONOSCOPY N/A 05/14/2013   Dr. Gala Romney: hemorrhoids, colonic diverticula, redudant colon  . ESOPHAGOGASTRODUODENOSCOPY  01/21/2003   RMR:  Normal esophagus, stomach, D1 and D2  . ESOPHAGOGASTRODUODENOSCOPY N/A 03/24/2014   Dr. Gala Romney :Hiatal hernia; otherwise negative EGD  . TONSILLECTOMY     Social History   Occupational History  . 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 Smoking status: Never Smoker  .  Smokeless tobacco: Never Used  . Tobacco comment: Never smoked  Substance and Sexual Activity  . Alcohol use: No  . Drug use: No  . Sexual activity: Not on file

## 2019-03-27 NOTE — Telephone Encounter (Signed)
Pt returned call. Pt was notified of LSL recommendations. Pt is taking Pantoprazole bid as directed and missed a few days of her medication. Pt will keep an eye out on her symptoms and if they return, she will discuss with her PCP or go to the ED.

## 2019-03-28 ENCOUNTER — Telehealth: Payer: Self-pay | Admitting: *Deleted

## 2019-03-28 NOTE — Telephone Encounter (Signed)
   [facility] Pre-operative Risk Assessment    Request for surgical clearance:  1. What type of surgery is being performed? RIGHT TOTAL HIP ARTHROPLASTY    2. When is this surgery scheduled? TBD   3. What type of clearance is required (medical clearance vs. Pharmacy clearance to hold med vs. Both)? MEDICAL  4. Are there any medications that need to be held prior to surgery and how long? NONE LISTED   5. Practice name and name of physician performing surgery?  [facility]; DR. Jean Rosenthal   6. What is your office phone number [phone]    7.   What is your office fax number [phone] ATTN: SHERRIE  8.   Anesthesia type (None, local, MAC, general) ? GENERAL vs. CHOICE   Julaine Hua 03/28/2019, 2:08 PM  _________________________________________________________________   (provider comments below)

## 2019-03-31 ENCOUNTER — Other Ambulatory Visit: Payer: Self-pay

## 2019-03-31 ENCOUNTER — Encounter: Payer: Self-pay | Admitting: Cardiology

## 2019-03-31 ENCOUNTER — Telehealth: Payer: Self-pay | Admitting: Orthopaedic Surgery

## 2019-03-31 ENCOUNTER — Ambulatory Visit: Payer: Federal, State, Local not specified - PPO | Admitting: Cardiology

## 2019-03-31 VITALS — BP 122/82 | HR 74 | Ht 65.5 in | Wt 187.6 lb

## 2019-03-31 DIAGNOSIS — M1611 Unilateral primary osteoarthritis, right hip: Secondary | ICD-10-CM

## 2019-03-31 DIAGNOSIS — Z8249 Family history of ischemic heart disease and other diseases of the circulatory system: Secondary | ICD-10-CM

## 2019-03-31 DIAGNOSIS — I10 Essential (primary) hypertension: Secondary | ICD-10-CM | POA: Diagnosis not present

## 2019-03-31 DIAGNOSIS — K219 Gastro-esophageal reflux disease without esophagitis: Secondary | ICD-10-CM

## 2019-03-31 DIAGNOSIS — Z01818 Encounter for other preprocedural examination: Secondary | ICD-10-CM | POA: Insufficient documentation

## 2019-03-31 NOTE — Telephone Encounter (Signed)
Medical and Cardiac clearance from Dr. Willey Blade has been received in our office for patient's right total hip arthroplasty.  Please provide surgery sheet and I will get patient scheduled at her convenience. Thanks

## 2019-03-31 NOTE — Addendum Note (Signed)
Addended by: Hinton Dyer on: 03/31/2019 11:17 AM   Modules accepted: Orders

## 2019-03-31 NOTE — Assessment & Plan Note (Signed)
>>  ASSESSMENT AND PLAN FOR GERD WRITTEN ON 03/31/2019  8:27 AM BY Abelino Derrick, PA-C  H/O GERD

## 2019-03-31 NOTE — Assessment & Plan Note (Signed)
Seen today for pre op clearance prior to hip surgery

## 2019-03-31 NOTE — Telephone Encounter (Signed)
Eileen Green you are to see Eileen Green today.  Please address pre-op clearance for Rt total hip thanks

## 2019-03-31 NOTE — Assessment & Plan Note (Signed)
Pt c/o of an episode of significant SSCP that awakened her from sleep

## 2019-03-31 NOTE — Telephone Encounter (Signed)
Patient is having total hip surgery with Dr. Jean Rosenthal. I will forward medical clearance to [employer].

## 2019-03-31 NOTE — Assessment & Plan Note (Signed)
H/O GERD

## 2019-03-31 NOTE — Progress Notes (Signed)
Cardiology Office Note:    Date:  03/31/2019   ID:  Eileen Green, DOB Jul 30, 1950, MRN [id number]  PCP:  Asencion Noble, MD  Cardiologist:  Pixie Casino, MD  Electrophysiologist:  None   Referring MD: Asencion Noble, MD   CC: chest pain- pre op clearance  History of Present Illness:    Eileen Green is a 69 y.o. female with a hx of hypertension, dyslipidemia, and prior evaluation for chest pain.  The patient had a negative stress echo in 2018.  She is seen in the office today mainly for preoperative clearance prior to right hip surgery.  She also relates a history of severe midsternal chest pain that awakened her from sleep a week ago.  She has not had recurrence of this.  The patient says she woke up at 2:30 AM with midsternal chest pain.  She could not get comfortable.  She took Tums without relief.  The pain lasted some time, a few hours.  It eventually subsided.  She has not had recurrent symptoms or exertional symptoms.  When she had her symptoms she did not have diaphoresis or radiation to her arms or jaw.  She is scheduled to have right hip surgery and needs cardiac clearance.  Past Medical History:  Diagnosis Date  . Arthritis   . Cancer ([place])    skin melanoma under left eye  . Deaf    LEFT EAR  . GERD (gastroesophageal reflux disease)   . Hyperlipidemia Deaf in Left ear    Past Surgical History:  Procedure Laterality Date  . ABDOMINAL HYSTERECTOMY  2001   Partial  . COLONOSCOPY  01/21/2003   [id number] rectum/ Long capacious colon, moderate prep made the exam more challenging.  Marland Kitchen COLONOSCOPY  05/18/2009   Dr. Gala Romney: Normal rectum/narrow mouth left sided diverticula   . COLONOSCOPY N/A 05/14/2013   Dr. Gala Romney: hemorrhoids, colonic diverticula, redudant colon  . ESOPHAGOGASTRODUODENOSCOPY  01/21/2003   RMR:  Normal esophagus, stomach, D1 and D2  . ESOPHAGOGASTRODUODENOSCOPY N/A 03/24/2014   Dr. Gala Romney :Hiatal hernia; otherwise negative EGD  . TONSILLECTOMY      Current  Medications: Current Meds  Medication Sig  . Carboxymethylcellulose Sodium (THERATEARS OP) Apply to eye as needed.  . ezetimibe (ZETIA) 10 MG tablet TAKE 1 TABLET(10 MG) BY MOUTH DAILY  . Glucosamine-Chondroitin (OSTEO BI-FLEX REGULAR STRENGTH PO) Take by mouth.  . Lactobacillus (DIGESTIVE HEALTH PROBIOTIC PO) Take 1 tablet by mouth daily.   . Methylcellulose, Laxative, 500 MG TABS Take 2 tablets by mouth daily.   . Misc Natural Products (OSTEO BI-FLEX TRIPLE STRENGTH PO) Take by mouth daily.    . Multiple Vitamin (MULTIVITAMIN) capsule Take 0.5 capsules by mouth every other day.   . naproxen sodium (ALEVE) 220 MG tablet Take 220 mg by mouth.  . pantoprazole (PROTONIX) 40 MG tablet Take 1 tablet (40 mg total) by mouth every morning.  . polyethylene glycol (MIRALAX / GLYCOLAX) packet Take 17 g by mouth daily.  . rosuvastatin (CRESTOR) 5 MG tablet Take 1 tablet by mouth once weekly.  Marland Kitchen SYNTHROID 50 MCG tablet Take 1 tablet (50 mcg total) by mouth daily.  . valsartan (DIOVAN) 320 MG tablet Take 1 tablet by mouth daily.     Allergies:   Patient has no known allergies.   Social History   Socioeconomic History  . Marital status: Married    Spouse name: Not on file  . Number of children: Not on file  . Years of education: Not  on file  . Highest education level: Not on file  Occupational History  . 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 Smoking status: Never Smoker  . Smokeless tobacco: Never Used  . Tobacco comment: Never smoked  Substance and Sexual Activity  . Alcohol use: No  . Drug use: No  . Sexual activity: Not on file  Other Topics Concern  . Not on file  Social History Narrative  . Not on file   Social Determinants of Health   Financial Resource Strain:   . Difficulty of Paying Living Expenses:   Food Insecurity:   . Worried About Charity fundraiser in the Last Year:   . Arboriculturist in the Last Year:    Transportation Needs:   . Film/video editor (Medical):   Marland Kitchen Lack of Transportation (Non-Medical):   Physical Activity:   . Days of Exercise per Week:   . Minutes of Exercise per Session:   Stress:   . Feeling of Stress :   Social Connections:   . Frequency of Communication with Friends and Family:   . Frequency of Social Gatherings with Friends and Family:   . Attends Religious Services:   . Active Member of Clubs or Organizations:   . Attends Archivist Meetings:   Marland Kitchen Marital Status:      Family History: The patient's family history includes Dementia in her mother; Heart disease (age of onset: 69) in her mother; Kidney disease in her father; Thyroid nodules in her father. There is no history of Colon cancer.  ROS:   Please see the history of present illness.  Pt has known GERD    All other systems reviewed and are negative.  EKGs/Labs/Other Studies Reviewed:    The following studies were reviewed today: Stress echo 2018  EKG:  EKG is ordered today.  The ekg ordered today demonstrates NSR, HR 74, no acute changes  Recent Labs: No results found for requested labs within last 8760 hours.  Recent Lipid Panel    Component Value Date/Time   CHOL 166 09/20/2018 0903   TRIG 70 09/20/2018 0903   HDL 56 09/20/2018 0903   CHOLHDL 3.0 09/20/2018 0903   CHOLHDL 3.3 07/26/2016 0805   VLDL 20 07/26/2016 0805   LDLCALC 97 09/20/2018 0903    Physical Exam:    VS:  BP 122/82   Pulse 74   Ht 5' 5.5" (1.664 m)   Wt 187 lb 9.6 oz (85.1 kg)   SpO2 96%   BMI 30.74 kg/m     Wt Readings from Last 3 Encounters:  03/31/19 187 lb 9.6 oz (85.1 kg)  03/26/19 186 lb 9.6 oz (84.6 kg)  03/25/19 186 lb (84.4 kg)     GEN:  Well nourished, well developed in no acute distress HEENT: Normal NECK: No JVD; No carotid bruits LYMPHATICS: No lymphadenopathy CARDIAC:RRR, no murmurs, rubs, gallops RESPIRATORY:  Clear to auscultation without rales, wheezing or rhonchi  ABDOMEN:  Soft, non-tender, non-distended MUSCULOSKELETAL:  No edema; No deformity  SKIN: Warm and dry NEUROLOGIC:  Alert and oriented x 3 PSYCHIATRIC:  Normal affect   ASSESSMENT:    Pre-operative clearance Seen today for pre op clearance prior to hip surgery  Chest pain Pt c/o of an episode of significant SSCP that awakened her from sleep  Dyslipidemia On statin rx  Essential hypertension Controlled on medication  GERD H/O GERD  PLAN:  Lexiscan Myoview prior to granting clearance for hip surgery.  I'll forward a copy of today's note to Dr Durward Fortes who is the patient's orthopedist (Dr Ninfa Linden will be the operating surgeon).    Medication Adjustments/Labs and Tests Ordered: Current medicines are reviewed at length with the patient today.  Concerns regarding medicines are outlined above.  No orders of the defined types were placed in this encounter.  No orders of the defined types were placed in this encounter.   Patient Instructions  Medication Instructions:  Your physician recommends that you continue on your current medications as directed. Please refer to the Current Medication list given to you today.  *If you need a refill on your cardiac medications before your next appointment, please call your pharmacy*    Testing/Procedures: Your physician has requested that you have a lexiscan myoview. For further information please visit [email or web address]. Please follow instruction sheet, as given.   Follow-Up: At [facility], you and your health needs are our priority.  As part of our continuing mission to provide you with exceptional heart care, we have created designated Provider Care Teams.  These Care Teams include your primary Cardiologist (physician) and Advanced Practice Providers (APPs -  Physician Assistants and Nurse Practitioners) who all work together to provide you with the care you need, when you need it.  We recommend signing up for the patient portal called  "MyChart".  Sign up information is provided on this After Visit Summary.  MyChart is used to connect with patients for Virtual Visits (Telemedicine).  Patients are able to view lab/test results, encounter notes, upcoming appointments, etc.  Non-urgent messages can be sent to your provider as well.   To learn more about what you can do with MyChart, go to [email or web address].    Your next appointment:   We will call you with your test results and let you know when to follow-up.      Angelena Form, PA-C  03/31/2019 8:31 AM    [facility]

## 2019-03-31 NOTE — Patient Instructions (Signed)
Medication Instructions:  Your physician recommends that you continue on your current medications as directed. Please refer to the Current Medication list given to you today.  *If you need a refill on your cardiac medications before your next appointment, please call your pharmacy*    Testing/Procedures: Your physician has requested that you have a lexiscan myoview. For further information please visit [email or web address]. Please follow instruction sheet, as given.   Follow-Up: At [facility], you and your health needs are our priority.  As part of our continuing mission to provide you with exceptional heart care, we have created designated Provider Care Teams.  These Care Teams include your primary Cardiologist (physician) and Advanced Practice Providers (APPs -  Physician Assistants and Nurse Practitioners) who all work together to provide you with the care you need, when you need it.  We recommend signing up for the patient portal called "MyChart".  Sign up information is provided on this After Visit Summary.  MyChart is used to connect with patients for Virtual Visits (Telemedicine).  Patients are able to view lab/test results, encounter notes, upcoming appointments, etc.  Non-urgent messages can be sent to your provider as well.   To learn more about what you can do with MyChart, go to [email or web address].    Your next appointment:   We will call you with your test results and let you know when to follow-up.

## 2019-03-31 NOTE — Assessment & Plan Note (Signed)
On statin rx

## 2019-03-31 NOTE — Assessment & Plan Note (Signed)
Controlled on medication

## 2019-04-01 ENCOUNTER — Ambulatory Visit: Payer: Federal, State, Local not specified - PPO | Admitting: Orthopaedic Surgery

## 2019-04-01 NOTE — Telephone Encounter (Signed)
Awaiting Lexiscan stress test results before clearing for surgery.

## 2019-04-02 ENCOUNTER — Telehealth (HOSPITAL_COMMUNITY): Payer: Self-pay

## 2019-04-02 NOTE — Telephone Encounter (Signed)
Encounter complete. 

## 2019-04-03 ENCOUNTER — Other Ambulatory Visit: Payer: Self-pay

## 2019-04-03 ENCOUNTER — Ambulatory Visit (HOSPITAL_COMMUNITY)
Admission: RE | Admit: 2019-04-03 | Discharge: 2019-04-03 | Disposition: A | Discharge status: Home / Self Care | Payer: Federal, State, Local not specified - PPO | Source: Ambulatory Visit | Attending: Cardiology | Admitting: Cardiology

## 2019-04-03 DIAGNOSIS — Z8249 Family history of ischemic heart disease and other diseases of the circulatory system: Secondary | ICD-10-CM | POA: Diagnosis present

## 2019-04-03 DIAGNOSIS — M1611 Unilateral primary osteoarthritis, right hip: Secondary | ICD-10-CM | POA: Diagnosis present

## 2019-04-03 DIAGNOSIS — Z01818 Encounter for other preprocedural examination: Secondary | ICD-10-CM | POA: Diagnosis not present

## 2019-04-03 LAB — MYOCARDIAL PERFUSION IMAGING
LV dias vol: 80 mL (ref 46–106)
LV sys vol: 33 mL
Peak HR: 115 {beats}/min
Rest HR: 72 {beats}/min
SDS: 4
SRS: 0
SSS: 4
TID: 1.23

## 2019-04-03 MED ORDER — TECHNETIUM TC 99M TETROFOSMIN IV KIT
10.4000 | PACK | Freq: Once | INTRAVENOUS | Status: AC | PRN
  Administered 2019-04-03: 10.4 via INTRAVENOUS
  Filled 2019-04-03: qty 11

## 2019-04-03 MED ORDER — REGADENOSON 0.4 MG/5ML IV SOLN
0.4000 mg | Freq: Once | INTRAVENOUS | Status: AC
  Administered 2019-04-03: 0.4 mg via INTRAVENOUS

## 2019-04-03 MED ORDER — TECHNETIUM TC 99M TETROFOSMIN IV KIT
29.8000 | PACK | Freq: Once | INTRAVENOUS | Status: AC | PRN
  Administered 2019-04-03: 29.8 via INTRAVENOUS
  Filled 2019-04-03: qty 30

## 2019-04-04 NOTE — Telephone Encounter (Signed)
   Primary Cardiologist: Pixie Casino, MD  Chart reviewed as part of pre-operative protocol coverage. Patient was contacted 04/04/2019 in reference to pre-operative risk assessment for pending surgery as outlined below.  Eileen Green was last seen on 03/31/19 by Kerin Ransom, PA.  Since that day, Eileen Green has done has had nuc. Stress test neg for ischemia. She has had another episode of chest pain and will call GI for eval.   No know hx of CAD.  Discussed with Dr. Debara Pickett.   Therefore, based on ACC/AHA guidelines, the patient would be at acceptable risk for the planned procedure without further cardiovascular testing.   I will route this recommendation to the requesting party via Epic fax function and remove from pre-op pool.  Please call with questions.  [name], NP 04/04/2019, 3:29 PM

## 2019-04-07 ENCOUNTER — Telehealth: Payer: Self-pay | Admitting: Internal Medicine

## 2019-04-07 DIAGNOSIS — K219 Gastro-esophageal reflux disease without esophagitis: Secondary | ICD-10-CM

## 2019-04-07 MED ORDER — PANTOPRAZOLE SODIUM 40 MG PO TBEC: 40.0000 mg | DELAYED_RELEASE_TABLET | Freq: Two times a day (BID) | ORAL | 3 refills | Status: DC

## 2019-04-07 NOTE — Telephone Encounter (Signed)
Completed.

## 2019-04-07 NOTE — Addendum Note (Signed)
Addended by: Annitta Needs on: 04/07/2019 04:05 PM   Modules accepted: Orders

## 2019-04-07 NOTE — Telephone Encounter (Signed)
Patient needs new prescription for pantoprozole 2 times a day sent to [facility] on [address].   Magda Paganini increased her dose and she is now out

## 2019-04-07 NOTE — Telephone Encounter (Signed)
Noted. Routing to [facility] refill box, Pt is taking Pantoprazole 40 mg bid

## 2019-04-11 ENCOUNTER — Other Ambulatory Visit: Payer: Self-pay

## 2019-04-11 ENCOUNTER — Telehealth: Payer: Self-pay | Admitting: *Deleted

## 2019-04-11 MED ORDER — FAMOTIDINE 20 MG PO TABS: 20.0000 mg | ORAL_TABLET | Freq: Every day | ORAL | 3 refills | Status: DC

## 2019-04-11 NOTE — Telephone Encounter (Signed)
Patient called in. She reports she is having issues with reflux again. Requests call back

## 2019-04-11 NOTE — Addendum Note (Signed)
Addended by: Mahala Menghini on: 04/11/2019 10:41 AM   Modules accepted: Orders

## 2019-04-11 NOTE — Telephone Encounter (Signed)
Called pt and verified name and dob. Pt stated since her visit  no mater what she eats or medication  she takes she is not getting any relief. She states she  is taking the protonix 40mg  twice a day and she has changed her eating habits but she wonders if her hernia is causing the problems. She also stated  that when she drank milk last night before bed it really helped her sleep with relief and she was able to sleep from 1a until 6am this morning and wants to know if she should snack before bed. Pt states she is goggling things and just does not know what to do to get relief. I notified pt that she should not eat or drink 2-3 hours prior to bed and that I would reach out to the provider who saw her in our office for future recommendation

## 2019-04-11 NOTE — Telephone Encounter (Signed)
Forwarding to [place] who last saw patient.

## 2019-04-11 NOTE — Telephone Encounter (Signed)
I would not advise eating prior to laying down. Her xray of esophagus shows reflux all the way to top of her esophagus.  Avoid fried foods. Nothing to eat for 2-3 hours prior to laying down. Pantoprazole 40mg  daily before BF, supper. Pepcid 20mg  at bedtime. I sent in rx, but insurance may make her purchase OTC.  If does not settle down, she needs to move her appt up closer. Currently she is scheduled in late May.

## 2019-04-11 NOTE — Telephone Encounter (Signed)
Called pt and notified her not to eat prior to laying down. Her xray of esophagus shows reflux all the way to top of her esophagus. Also, I advised her not to eat fried foods, Nothing to eat for 2-3 hours prior to laying down.to take Pantoprazole 40mg  daily before BF, supper and  Pepcid 20mg  at bedtime. Notified pt that the provider sent in rx to her insurance but insurance may make her purchase OTC and If does not settle down, she needs to move her appt up closer. Currently she is scheduled in late May. Pt stated she understood and  thanked me for the call. Pt stated that she would reschedule if the acid reflux does not get better

## 2019-04-25 NOTE — Progress Notes (Signed)
PCP - Asencion Noble, MD  Cardiologist - Lyman Bishop, MD w/cardiac clearance from [facility], [place] in Stress Test results  Chest x-ray -  EKG - 03-31-19 Stress Test - 04-03-19 ECHO -  Cardiac Cath -   Sleep Study -  CPAP -   Fasting Blood Sugar -  Checks Blood Sugar _____ times a day  Blood Thinner Instructions: Aspirin Instructions: Last Dose:  Anesthesia review:   Patient denies shortness of breath, fever, cough and chest pain at PAT appointment   Patient verbalized understanding of instructions that were given to them at the PAT appointment. Patient was also instructed that they will need to review over the PAT instructions again at home before surgery.

## 2019-04-25 NOTE — Progress Notes (Signed)
Please place surgery orders. Pt is scheduled for her PAT appt on 04-28-19. Thank you.

## 2019-04-25 NOTE — Patient Instructions (Addendum)
DUE TO COVID-19 ONLY ONE VISITOR IS ALLOWED TO COME WITH YOU AND STAY IN THE WAITING ROOM ONLY DURING PRE OP AND PROCEDURE DAY OF SURGERY. THE 1 VISITOR MAY VISIT WITH YOU AFTER SURGERY IN YOUR PRIVATE ROOM DURING VISITING HOURS ONLY!  YOU NEED TO HAVE A COVID 19 TEST ON 04-29-19 @ 2:50 PM, THIS TEST MUST BE DONE BEFORE SURGERY, COME  [place], [place]  , [id number].  ([place]) ONCE YOUR COVID TEST IS COMPLETED, PLEASE BEGIN THE QUARANTINE INSTRUCTIONS AS OUTLINED IN YOUR HANDOUT.                Eileen Green  04/25/2019   Your procedure is scheduled on: 05-02-19   Report to [facility] Main  Entrance    Report to Admitting at 7:15 AM     Call this number if you have problems the morning of surgery [phone]    Remember: AFTER MIDNIGHT THE NIGHT PRIOR TO SURGERY. NOTHING BY MOUTH EXCEPT CLEAR LIQUIDS UNTIL  6:45 AM. PLEASE FINISH ENSURE DRINK PER SURGEON ORDER  WHICH NEEDS TO BE COMPLETED AT 6:45 AM.   CLEAR LIQUID DIET   Foods Allowed                                                                     Foods Excluded  Coffee and tea, regular and decaf                             liquids that you cannot  Plain Jell-O any favor except red or purple                                           see through such as: Fruit ices (not with fruit pulp)                                     milk, soups, orange juice  Iced Popsicles                                    All solid food Carbonated beverages, regular and diet                                    Cranberry, grape and apple juices Sports drinks like Gatorade Lightly seasoned clear broth or consume(fat free) Sugar, honey syrup   _____________________________________________________________________     Take these medicines the morning of surgery with A SIP OF WATER: Ezetimibe (Zetia), Pantoprazole (Protonix), and Synthroid. You may also use your eyedrops.   BRUSH YOUR TEETH MORNING OF SURGERY AND  RINSE YOUR MOUTH OUT, NO CHEWING GUM CANDY OR MINTS.                                  You may  not have any metal on your body including hair pins and              piercings     Do not wear jewelry, make-up, lotions, powders or perfumes, deodorant              Do not wear nail polish on your fingernails.  Do not shave  48 hours prior to surgery.              Do not bring valuables to the hospital. [place].  Contacts, dentures or bridgework may not be worn into surgery.  You may bring a small overnight bag     Special Instructions: N/A              Please read over the following fact sheets you were given: _____________________________________________________________________             [facility] - Preparing for Surgery Before surgery, you can play an important role.  Because skin is not sterile, your skin needs to be as free of germs as possible.  You can reduce the number of germs on your skin by washing with CHG (chlorahexidine gluconate) soap before surgery.  CHG is an antiseptic cleaner which kills germs and bonds with the skin to continue killing germs even after washing. Please DO NOT use if you have an allergy to CHG or antibacterial soaps.  If your skin becomes reddened/irritated stop using the CHG and inform your nurse when you arrive at Short Stay. Do not shave (including legs and underarms) for at least 48 hours prior to the first CHG shower.  You may shave your face/neck. Please follow these instructions carefully:  1.  Shower with CHG Soap the night before surgery and the  morning of Surgery.  2.  If you choose to wash your hair, wash your hair first as usual with your  normal  shampoo.  3.  After you shampoo, rinse your hair and body thoroughly to remove the  shampoo.                           4.  Use CHG as you would any other liquid soap.  You can apply chg directly  to the skin and wash                        Gently with a scrungie or clean washcloth.  5.  Apply the CHG Soap to your body ONLY FROM THE NECK DOWN.   Do not use on face/ open                           Wound or open sores. Avoid contact with eyes, ears mouth and genitals (private parts).                       Wash face,  Genitals (private parts) with your normal soap.             6.  Wash thoroughly, paying special attention to the area where your surgery  will be performed.  7.  Thoroughly rinse your body with warm water from the neck down.  8.  DO NOT shower/wash with your normal soap after using and rinsing off  the CHG Soap.  9.  Pat yourself dry with a clean towel.            10.  Wear clean pajamas.            11.  Place clean sheets on your bed the night of your first shower and do not  sleep with pets. Day of Surgery : Do not apply any lotions/deodorants the morning of surgery.  Please wear clean clothes to the hospital/surgery center.  FAILURE TO FOLLOW THESE INSTRUCTIONS MAY RESULT IN THE CANCELLATION OF YOUR SURGERY PATIENT SIGNATURE_________________________________  NURSE SIGNATURE__________________________________  ________________________________________________________________________

## 2019-04-28 ENCOUNTER — Encounter (HOSPITAL_COMMUNITY): Payer: Self-pay

## 2019-04-28 ENCOUNTER — Encounter (HOSPITAL_COMMUNITY)
Admission: RE | Admit: 2019-04-28 | Discharge: 2019-04-28 | Disposition: A | Discharge status: Home / Self Care | Payer: Federal, State, Local not specified - PPO | Source: Ambulatory Visit | Attending: Orthopaedic Surgery | Admitting: Orthopaedic Surgery

## 2019-04-28 ENCOUNTER — Other Ambulatory Visit: Payer: Self-pay

## 2019-04-28 ENCOUNTER — Other Ambulatory Visit: Payer: Self-pay | Admitting: Physician Assistant

## 2019-04-28 DIAGNOSIS — Z01812 Encounter for preprocedural laboratory examination: Secondary | ICD-10-CM | POA: Insufficient documentation

## 2019-04-28 HISTORY — DX: Essential (primary) hypertension: I10

## 2019-04-28 LAB — ABO/RH: ABO/RH(D): O NEG

## 2019-04-28 LAB — BASIC METABOLIC PANEL
Anion gap: 7 (ref 5–15)
BUN: 13 mg/dL (ref 8–23)
CO2: 27 mmol/L (ref 22–32)
Calcium: 10.1 mg/dL (ref 8.9–10.3)
Chloride: 107 mmol/L (ref 98–111)
Creatinine, Ser: 0.84 mg/dL (ref 0.44–1.00)
GFR calc Af Amer: >60 mL/min (ref >60)
GFR calc non Af Amer: >60 mL/min (ref >60)
Glucose, Bld: 109 mg/dL — ABNORMAL HIGH (ref 70–99)
Potassium: 3.9 mmol/L (ref 3.5–5.1)
Sodium: 141 mmol/L (ref 135–145)

## 2019-04-28 LAB — SURGICAL PCR SCREEN
MRSA, PCR: NEGATIVE
Staphylococcus aureus: NEGATIVE

## 2019-04-28 LAB — CBC
HCT: 45.6 % (ref 36.0–46.0)
Hemoglobin: 14.3 g/dL (ref 12.0–15.0)
MCH: 29.2 pg (ref 26.0–34.0)
MCHC: 31.4 g/dL (ref 30.0–36.0)
MCV: 93.3 fL (ref 80.0–100.0)
Platelets: 354 10*3/uL (ref 150–400)
RBC: 4.89 MIL/uL (ref 3.87–5.11)
RDW: 13.3 % (ref 11.5–15.5)
WBC: 8.1 10*3/uL (ref 4.0–10.5)
nRBC: 0 % (ref 0.0–0.2)

## 2019-04-29 ENCOUNTER — Other Ambulatory Visit (HOSPITAL_COMMUNITY)
Admission: RE | Admit: 2019-04-29 | Discharge: 2019-04-29 | Disposition: A | Discharge status: Home / Self Care | Payer: Federal, State, Local not specified - PPO | Source: Ambulatory Visit | Attending: Orthopaedic Surgery | Admitting: Orthopaedic Surgery

## 2019-04-29 DIAGNOSIS — Z01812 Encounter for preprocedural laboratory examination: Secondary | ICD-10-CM | POA: Insufficient documentation

## 2019-04-29 DIAGNOSIS — Z20822 Contact with and (suspected) exposure to covid-19: Secondary | ICD-10-CM | POA: Insufficient documentation

## 2019-04-29 LAB — SARS CORONAVIRUS 2 (TAT 6-24 HRS): SARS Coronavirus 2: NEGATIVE

## 2019-05-02 ENCOUNTER — Ambulatory Visit (HOSPITAL_COMMUNITY): Payer: Federal, State, Local not specified - PPO

## 2019-05-02 ENCOUNTER — Observation Stay (HOSPITAL_COMMUNITY): Payer: Federal, State, Local not specified - PPO

## 2019-05-02 ENCOUNTER — Other Ambulatory Visit: Payer: Self-pay

## 2019-05-02 ENCOUNTER — Ambulatory Visit (HOSPITAL_COMMUNITY): Payer: Federal, State, Local not specified - PPO | Admitting: Certified Registered"

## 2019-05-02 ENCOUNTER — Encounter (HOSPITAL_COMMUNITY): Payer: Self-pay | Admitting: Orthopaedic Surgery

## 2019-05-02 ENCOUNTER — Encounter (HOSPITAL_COMMUNITY)
Admission: RE | Disposition: A | Discharge status: Home Health | Payer: Self-pay | Source: Home / Self Care | Attending: Orthopaedic Surgery

## 2019-05-02 ENCOUNTER — Observation Stay (HOSPITAL_COMMUNITY)
Admission: RE | Admit: 2019-05-02 | Discharge: 2019-05-03 | Disposition: A | Discharge status: Home Health | Payer: Federal, State, Local not specified - PPO | Attending: Orthopaedic Surgery | Admitting: Orthopaedic Surgery

## 2019-05-02 DIAGNOSIS — Z79890 Hormone replacement therapy (postmenopausal): Secondary | ICD-10-CM | POA: Diagnosis not present

## 2019-05-02 DIAGNOSIS — E039 Hypothyroidism, unspecified: Secondary | ICD-10-CM | POA: Diagnosis not present

## 2019-05-02 DIAGNOSIS — M1611 Unilateral primary osteoarthritis, right hip: Principal | ICD-10-CM | POA: Insufficient documentation

## 2019-05-02 DIAGNOSIS — Z419 Encounter for procedure for purposes other than remedying health state, unspecified: Secondary | ICD-10-CM

## 2019-05-02 DIAGNOSIS — E785 Hyperlipidemia, unspecified: Secondary | ICD-10-CM | POA: Insufficient documentation

## 2019-05-02 DIAGNOSIS — K219 Gastro-esophageal reflux disease without esophagitis: Secondary | ICD-10-CM | POA: Insufficient documentation

## 2019-05-02 DIAGNOSIS — Z79899 Other long term (current) drug therapy: Secondary | ICD-10-CM | POA: Diagnosis not present

## 2019-05-02 DIAGNOSIS — I10 Essential (primary) hypertension: Secondary | ICD-10-CM | POA: Diagnosis not present

## 2019-05-02 DIAGNOSIS — Z96641 Presence of right artificial hip joint: Secondary | ICD-10-CM

## 2019-05-02 HISTORY — PX: TOTAL HIP ARTHROPLASTY: SHX124

## 2019-05-02 LAB — TYPE AND SCREEN
ABO/RH(D): O NEG
Antibody Screen: NEGATIVE

## 2019-05-02 SURGERY — ARTHROPLASTY, HIP, TOTAL, ANTERIOR APPROACH
Anesthesia: Spinal | Site: Hip | Laterality: Right

## 2019-05-02 MED ORDER — GABAPENTIN 100 MG PO CAPS
100.0000 mg | ORAL_CAPSULE | Freq: Three times a day (TID) | ORAL | Status: DC
  Administered 2019-05-02 – 2019-05-03 (×3): 100 mg via ORAL
  Filled 2019-05-02 (×3): qty 1

## 2019-05-02 MED ORDER — EZETIMIBE 10 MG PO TABS
10.0000 mg | ORAL_TABLET | Freq: Every day | ORAL | Status: DC
  Administered 2019-05-03: 10 mg via ORAL
  Filled 2019-05-02: qty 1

## 2019-05-02 MED ORDER — IRBESARTAN 150 MG PO TABS
300.0000 mg | ORAL_TABLET | Freq: Every day | ORAL | Status: DC
  Administered 2019-05-03: 300 mg via ORAL
  Filled 2019-05-02: qty 2

## 2019-05-02 MED ORDER — PHENYLEPHRINE HCL (PRESSORS) 10 MG/ML IV SOLN
INTRAVENOUS | Status: AC
  Filled 2019-05-02: qty 1

## 2019-05-02 MED ORDER — CEFAZOLIN SODIUM-DEXTROSE 1-4 GM/50ML-% IV SOLN
1.0000 g | Freq: Four times a day (QID) | INTRAVENOUS | Status: AC
  Administered 2019-05-02 (×2): 1 g via INTRAVENOUS
  Filled 2019-05-02 (×2): qty 50

## 2019-05-02 MED ORDER — ALUM & MAG HYDROXIDE-SIMETH 200-200-20 MG/5ML PO SUSP: 30.0000 mL | ORAL | Status: DC | PRN

## 2019-05-02 MED ORDER — SODIUM CHLORIDE 0.9 % IV SOLN: INTRAVENOUS | Status: DC

## 2019-05-02 MED ORDER — ASPIRIN 81 MG PO CHEW
81.0000 mg | CHEWABLE_TABLET | Freq: Two times a day (BID) | ORAL | Status: DC
  Administered 2019-05-02 – 2019-05-03 (×2): 81 mg via ORAL
  Filled 2019-05-02 (×2): qty 1

## 2019-05-02 MED ORDER — CEFAZOLIN SODIUM-DEXTROSE 2-4 GM/100ML-% IV SOLN
2.0000 g | INTRAVENOUS | Status: AC
  Administered 2019-05-02: 2 g via INTRAVENOUS
  Filled 2019-05-02: qty 100

## 2019-05-02 MED ORDER — DOCUSATE SODIUM 100 MG PO CAPS
100.0000 mg | ORAL_CAPSULE | Freq: Two times a day (BID) | ORAL | Status: DC
  Administered 2019-05-02 – 2019-05-03 (×2): 100 mg via ORAL
  Filled 2019-05-02 (×2): qty 1

## 2019-05-02 MED ORDER — FAMOTIDINE 20 MG PO TABS
20.0000 mg | ORAL_TABLET | Freq: Every day | ORAL | Status: DC
  Administered 2019-05-02: 20 mg via ORAL
  Filled 2019-05-02: qty 1

## 2019-05-02 MED ORDER — ONDANSETRON HCL 4 MG/2ML IJ SOLN
INTRAMUSCULAR | Status: DC | PRN
  Administered 2019-05-02: 4 mg via INTRAVENOUS

## 2019-05-02 MED ORDER — HYDROMORPHONE HCL 1 MG/ML IJ SOLN
0.2500 mg | INTRAMUSCULAR | Status: DC | PRN
  Administered 2019-05-02: 0.5 mg via INTRAVENOUS
  Administered 2019-05-02 (×2): 0.25 mg via INTRAVENOUS

## 2019-05-02 MED ORDER — HYDROMORPHONE HCL 1 MG/ML IJ SOLN: 0.5000 mg | INTRAMUSCULAR | Status: DC | PRN

## 2019-05-02 MED ORDER — MIDAZOLAM HCL 2 MG/2ML IJ SOLN: 0.5000 mg | Freq: Once | INTRAMUSCULAR | Status: DC | PRN

## 2019-05-02 MED ORDER — ONDANSETRON HCL 4 MG PO TABS: 4.0000 mg | ORAL_TABLET | Freq: Four times a day (QID) | ORAL | Status: DC | PRN

## 2019-05-02 MED ORDER — LEVOTHYROXINE SODIUM 50 MCG PO TABS
50.0000 ug | ORAL_TABLET | Freq: Every day | ORAL | Status: DC
  Administered 2019-05-03: 50 ug via ORAL
  Filled 2019-05-02: qty 1

## 2019-05-02 MED ORDER — FENTANYL CITRATE (PF) 100 MCG/2ML IJ SOLN
INTRAMUSCULAR | Status: DC | PRN
  Administered 2019-05-02: 50 ug via INTRAVENOUS

## 2019-05-02 MED ORDER — POLYETHYLENE GLYCOL 3350 17 G PO PACK: 17.0000 g | PACK | Freq: Every day | ORAL | Status: DC | PRN

## 2019-05-02 MED ORDER — VITAMIN D3 25 MCG (1000 UNIT) PO TABS
2000.0000 [IU] | ORAL_TABLET | Freq: Every day | ORAL | Status: DC
  Administered 2019-05-02: 2000 [IU] via ORAL
  Filled 2019-05-02 (×2): qty 2

## 2019-05-02 MED ORDER — PROMETHAZINE HCL 25 MG/ML IJ SOLN: 6.2500 mg | INTRAMUSCULAR | Status: DC | PRN

## 2019-05-02 MED ORDER — ONDANSETRON HCL 4 MG/2ML IJ SOLN
INTRAMUSCULAR | Status: AC
  Filled 2019-05-02: qty 2

## 2019-05-02 MED ORDER — METOCLOPRAMIDE HCL 5 MG/ML IJ SOLN: 5.0000 mg | Freq: Three times a day (TID) | INTRAMUSCULAR | Status: DC | PRN

## 2019-05-02 MED ORDER — EPHEDRINE SULFATE-NACL 50-0.9 MG/10ML-% IV SOSY
PREFILLED_SYRINGE | INTRAVENOUS | Status: DC | PRN
  Administered 2019-05-02 (×2): 10 mg via INTRAVENOUS

## 2019-05-02 MED ORDER — TRANEXAMIC ACID-NACL 1000-0.7 MG/100ML-% IV SOLN
1000.0000 mg | INTRAVENOUS | Status: AC
  Administered 2019-05-02: 1000 mg via INTRAVENOUS
  Filled 2019-05-02: qty 100

## 2019-05-02 MED ORDER — PROPOFOL 10 MG/ML IV BOLUS
INTRAVENOUS | Status: AC
  Filled 2019-05-02: qty 20

## 2019-05-02 MED ORDER — LACTATED RINGERS IV SOLN: INTRAVENOUS | Status: DC | PRN

## 2019-05-02 MED ORDER — MIDAZOLAM HCL 2 MG/2ML IJ SOLN
INTRAMUSCULAR | Status: DC | PRN
  Administered 2019-05-02: 2 mg via INTRAVENOUS

## 2019-05-02 MED ORDER — METHOCARBAMOL 500 MG IVPB - SIMPLE MED
500.0000 mg | Freq: Four times a day (QID) | INTRAVENOUS | Status: DC | PRN
  Administered 2019-05-02: 12:00:00 500 mg via INTRAVENOUS
  Filled 2019-05-02: qty 50

## 2019-05-02 MED ORDER — METHOCARBAMOL 500 MG PO TABS
500.0000 mg | ORAL_TABLET | Freq: Four times a day (QID) | ORAL | Status: DC | PRN
  Administered 2019-05-02 – 2019-05-03 (×2): 500 mg via ORAL
  Filled 2019-05-02 (×2): qty 1

## 2019-05-02 MED ORDER — 0.9 % SODIUM CHLORIDE (POUR BTL) OPTIME
TOPICAL | Status: DC | PRN
  Administered 2019-05-02: 1000 mL

## 2019-05-02 MED ORDER — LIDOCAINE 2% (20 MG/ML) 5 ML SYRINGE
INTRAMUSCULAR | Status: AC
  Filled 2019-05-02: qty 5

## 2019-05-02 MED ORDER — MEPERIDINE HCL 50 MG/ML IJ SOLN: 6.2500 mg | INTRAMUSCULAR | Status: DC | PRN

## 2019-05-02 MED ORDER — METHOCARBAMOL 500 MG IVPB - SIMPLE MED
INTRAVENOUS | Status: AC
  Filled 2019-05-02: qty 50

## 2019-05-02 MED ORDER — LIDOCAINE 2% (20 MG/ML) 5 ML SYRINGE
INTRAMUSCULAR | Status: DC | PRN
  Administered 2019-05-02: 40 mg via INTRAVENOUS

## 2019-05-02 MED ORDER — FENTANYL CITRATE (PF) 100 MCG/2ML IJ SOLN
INTRAMUSCULAR | Status: AC
  Filled 2019-05-02: qty 2

## 2019-05-02 MED ORDER — DEXAMETHASONE SODIUM PHOSPHATE 10 MG/ML IJ SOLN
INTRAMUSCULAR | Status: AC
  Filled 2019-05-02: qty 1

## 2019-05-02 MED ORDER — PROPOFOL 500 MG/50ML IV EMUL
INTRAVENOUS | Status: DC | PRN
  Administered 2019-05-02: 100 ug/kg/min via INTRAVENOUS

## 2019-05-02 MED ORDER — PHENYLEPHRINE HCL-NACL 10-0.9 MG/250ML-% IV SOLN
INTRAVENOUS | Status: DC | PRN
  Administered 2019-05-02: 20 ug/min via INTRAVENOUS

## 2019-05-02 MED ORDER — DEXAMETHASONE SODIUM PHOSPHATE 10 MG/ML IJ SOLN
INTRAMUSCULAR | Status: DC | PRN
  Administered 2019-05-02: 8 mg via INTRAVENOUS

## 2019-05-02 MED ORDER — PANTOPRAZOLE SODIUM 40 MG PO TBEC
40.0000 mg | DELAYED_RELEASE_TABLET | Freq: Two times a day (BID) | ORAL | Status: DC
  Administered 2019-05-02 – 2019-05-03 (×2): 40 mg via ORAL
  Filled 2019-05-02 (×2): qty 1

## 2019-05-02 MED ORDER — OXYCODONE HCL 5 MG PO TABS: 10.0000 mg | ORAL_TABLET | ORAL | Status: DC | PRN

## 2019-05-02 MED ORDER — EPHEDRINE 5 MG/ML INJ
INTRAVENOUS | Status: AC
  Filled 2019-05-02: qty 20

## 2019-05-02 MED ORDER — METOCLOPRAMIDE HCL 5 MG PO TABS: 5.0000 mg | ORAL_TABLET | Freq: Three times a day (TID) | ORAL | Status: DC | PRN

## 2019-05-02 MED ORDER — MENTHOL 3 MG MT LOZG: 1.0000 | LOZENGE | OROMUCOSAL | Status: DC | PRN

## 2019-05-02 MED ORDER — ACETAMINOPHEN 325 MG PO TABS
325.0000 mg | ORAL_TABLET | Freq: Four times a day (QID) | ORAL | Status: DC | PRN
  Administered 2019-05-02: 15:00:00 650 mg via ORAL
  Filled 2019-05-02: qty 2

## 2019-05-02 MED ORDER — OLOPATADINE HCL 0.1 % OP SOLN
1.0000 [drp] | Freq: Every day | OPHTHALMIC | Status: DC
  Administered 2019-05-03: 1 [drp] via OPHTHALMIC
  Filled 2019-05-02: qty 5

## 2019-05-02 MED ORDER — POVIDONE-IODINE 10 % EX SWAB: 2.0000 "application " | Freq: Once | CUTANEOUS | Status: DC

## 2019-05-02 MED ORDER — MIDAZOLAM HCL 2 MG/2ML IJ SOLN
INTRAMUSCULAR | Status: AC
  Filled 2019-05-02: qty 2

## 2019-05-02 MED ORDER — ONDANSETRON HCL 4 MG/2ML IJ SOLN: 4.0000 mg | Freq: Four times a day (QID) | INTRAMUSCULAR | Status: DC | PRN

## 2019-05-02 MED ORDER — PROPOFOL 10 MG/ML IV BOLUS
INTRAVENOUS | Status: DC | PRN
  Administered 2019-05-02: 10 mg via INTRAVENOUS
  Administered 2019-05-02: 20 mg via INTRAVENOUS

## 2019-05-02 MED ORDER — PHENOL 1.4 % MT LIQD: 1.0000 | OROMUCOSAL | Status: DC | PRN

## 2019-05-02 MED ORDER — SODIUM CHLORIDE 0.9 % IR SOLN
Status: DC | PRN
  Administered 2019-05-02: 1000 mL

## 2019-05-02 MED ORDER — HYDROMORPHONE HCL 1 MG/ML IJ SOLN
INTRAMUSCULAR | Status: AC
  Filled 2019-05-02: qty 1

## 2019-05-02 MED ORDER — OXYCODONE HCL 5 MG PO TABS
5.0000 mg | ORAL_TABLET | ORAL | Status: DC | PRN
  Administered 2019-05-02 – 2019-05-03 (×4): 5 mg via ORAL
  Filled 2019-05-02 (×4): qty 1

## 2019-05-02 MED ORDER — DIPHENHYDRAMINE HCL 12.5 MG/5ML PO ELIX: 12.5000 mg | ORAL_SOLUTION | ORAL | Status: DC | PRN

## 2019-05-02 SURGICAL SUPPLY — 39 items
BAG SPEC THK2 15X12 ZIP CLS (MISCELLANEOUS)
BAG ZIPLOCK 12X15 (MISCELLANEOUS) IMPLANT
BENZOIN TINCTURE PRP APPL 2/3 (GAUZE/BANDAGES/DRESSINGS) IMPLANT
BLADE SAW SGTL 18X1.27X75 (BLADE) ×2 IMPLANT
COVER PERINEAL POST (MISCELLANEOUS) ×2 IMPLANT
COVER SURGICAL LIGHT HANDLE (MISCELLANEOUS) ×2 IMPLANT
COVER WAND RF STERILE (DRAPES) ×2 IMPLANT
DRAPE STERI IOBAN 125X83 (DRAPES) ×2 IMPLANT
DRAPE U-SHAPE 47X51 STRL (DRAPES) ×4 IMPLANT
DRSG AQUACEL AG ADV 3.5X10 (GAUZE/BANDAGES/DRESSINGS) ×2 IMPLANT
DURAPREP 26ML APPLICATOR (WOUND CARE) ×2 IMPLANT
ELECT REM PT RETURN 15FT ADLT (MISCELLANEOUS) ×2 IMPLANT
GAUZE XEROFORM 1X8 LF (GAUZE/BANDAGES/DRESSINGS) ×2 IMPLANT
GLOVE BIO SURGEON STRL SZ7.5 (GLOVE) ×2 IMPLANT
GLOVE BIOGEL PI IND STRL 8 (GLOVE) ×2 IMPLANT
GLOVE BIOGEL PI INDICATOR 8 (GLOVE) ×2
GLOVE ECLIPSE 8.0 STRL XLNG CF (GLOVE) ×2 IMPLANT
GOWN STRL REUS W/TWL XL LVL3 (GOWN DISPOSABLE) ×4 IMPLANT
HANDPIECE INTERPULSE COAX TIP (DISPOSABLE) ×2
HEAD CERAMIC DELTA 36 PLUS 1.5 (Hips) ×2 IMPLANT
HOLDER FOLEY CATH W/STRAP (MISCELLANEOUS) ×2 IMPLANT
KIT TURNOVER KIT A (KITS) IMPLANT
LINER ACETAB NEUTRAL 36ID 520D (Liner) ×2 IMPLANT
PACK ANTERIOR HIP CUSTOM (KITS) ×2 IMPLANT
PENCIL SMOKE EVACUATOR (MISCELLANEOUS) IMPLANT
PIN SECTOR W/GRIP ACE CUP 52MM (Hips) ×2 IMPLANT
SET HNDPC FAN SPRY TIP SCT (DISPOSABLE) ×1 IMPLANT
STAPLER VISISTAT 35W (STAPLE) IMPLANT
STEM CORAIL KA11 (Stem) ×2 IMPLANT
STRIP CLOSURE SKIN 1/2X4 (GAUZE/BANDAGES/DRESSINGS) IMPLANT
SUT ETHIBOND NAB CT1 #1 30IN (SUTURE) ×2 IMPLANT
SUT ETHILON 2 0 PS N (SUTURE) IMPLANT
SUT MNCRL AB 4-0 PS2 18 (SUTURE) IMPLANT
SUT VIC AB 0 CT1 36 (SUTURE) ×2 IMPLANT
SUT VIC AB 1 CT1 36 (SUTURE) ×2 IMPLANT
SUT VIC AB 2-0 CT1 27 (SUTURE) ×4
SUT VIC AB 2-0 CT1 TAPERPNT 27 (SUTURE) ×2 IMPLANT
TRAY FOLEY MTR SLVR 16FR STAT (SET/KITS/TRAYS/PACK) IMPLANT
YANKAUER SUCT BULB TIP 10FT TU (MISCELLANEOUS) ×2 IMPLANT

## 2019-05-02 NOTE — Transfer of Care (Signed)
Immediate Anesthesia Transfer of Care Note  Patient: Eileen Green  Procedure(s) Performed: RIGHT TOTAL HIP ARTHROPLASTY ANTERIOR APPROACH (Right Hip)  Patient Location: PACU  Anesthesia Type:MAC and Spinal  Level of Consciousness: awake, alert , oriented and patient cooperative  Airway & Oxygen Therapy: Patient Spontanous Breathing and Patient connected to face mask oxygen  Post-op Assessment: Report given to RN and Post -op Vital signs reviewed and stable  Post vital signs: Reviewed and stable  Last Vitals:  Vitals Value Taken Time  BP 118/67 05/02/19 1135  Temp    Pulse 96 05/02/19 1136  Resp 19 05/02/19 1136  SpO2 100 % 05/02/19 1136  Vitals shown include unvalidated device data.  Last Pain:  Vitals:   05/02/19 0806  TempSrc: Oral  PainSc:       Patients Stated Pain Goal: 4 (76/28/31 5176)  Complications: No apparent anesthesia complications

## 2019-05-02 NOTE — Anesthesia Preprocedure Evaluation (Addendum)
Anesthesia Evaluation  Patient identified by MRN, date of birth, ID band Patient awake    Reviewed: Allergy & Precautions, NPO status , Patient's Chart, lab work & pertinent test results  History of Anesthesia Complications Negative for: history of anesthetic complications  Airway Mallampati: II  TM Distance: >3 FB Neck ROM: Full    Dental  (+) Dental Advisory Given   Pulmonary neg pulmonary ROS,  04/29/2019 SARS coronavirus NEG   breath sounds clear to auscultation       Cardiovascular hypertension, Pt. on medications (-) angina Rhythm:Regular Rate:Normal  04/03/2019 Stress: LV ejection fraction is normal (55-65%), Nuclear stress EF: 59%,  no ST segment deviation noted during stress. The study is normal   Neuro/Psych negative neurological ROS     GI/Hepatic Neg liver ROS, GERD  Medicated and Controlled,  Endo/Other  Hypothyroidism   Renal/GU negative Renal ROS     Musculoskeletal   Abdominal   Peds  Hematology negative hematology ROS (+) plt 354k   Anesthesia Other Findings   Reproductive/Obstetrics                            Anesthesia Physical Anesthesia Plan  ASA: II  Anesthesia Plan: Spinal   Post-op Pain Management:    Induction:   PONV Risk Score and Plan: 2 and Ondansetron and Treatment may vary due to age or medical condition  Airway Management Planned: Natural Airway and Simple Face Mask  Additional Equipment:   Intra-op Plan:   Post-operative Plan:   Informed Consent: I have reviewed the patients History and Physical, chart, labs and discussed the procedure including the risks, benefits and alternatives for the proposed anesthesia with the patient or authorized representative who has indicated his/her understanding and acceptance.     Dental advisory given  Plan Discussed with: CRNA and Surgeon  Anesthesia Plan Comments:        Anesthesia Quick  Evaluation

## 2019-05-02 NOTE — Op Note (Signed)
NAME: Eileen Green, Eileen Green MEDICAL RECORD [id number] ACCOUNT [id number] DATE OF BIRTH:1950-07-08 FACILITY: [facility] LOCATION: [id number] PHYSICIAN:Zacharey Jensen [place], MD  OPERATIVE REPORT  DATE OF PROCEDURE:  05/02/2019  PREOPERATIVE DIAGNOSIS:  Primary osteoarthritis and degenerative joint disease, right hip.  POSTOPERATIVE DIAGNOSIS:  Primary osteoarthritis and degenerative joint disease, right hip.  PROCEDURE:  Right total hip arthroplasty through direct anterior approach.  IMPLANTS:  DePuy Sector Gription acetabular component size 52, size 36+0 neutral polyethylene liner, size 11 Corail femoral component with standard offset, size 36+1.5 ceramic hip ball.  SURGEON:  Lind Guest.  Ninfa Linden, MD  ASSISTANT:  Erskine Emery, PA-C  ANESTHESIA:  Spinal.  ANTIBIOTICS:  Two g IV Ancef.  ESTIMATED BLOOD LOSS:  300 mL.  COMPLICATIONS:  None.  INDICATIONS:  The patient is a very pleasant 69 year old female, well known to me.  She has developed debilitating arthritis of her right hip and it has gotten rapidly worse over the last 6 months to a year.  At this point, her pain is daily and is  detrimentally affecting her activities of daily living, quality of life and mobility.  She does wish to proceed with a total hip arthroplasty.  Her hip does show severe end-stage arthritis and she has tried and failed all forms of conservative treatment.   We did talk about the risk of acute blood loss anemia, nerve or vessel injury, fracture, infection, dislocation, DVT and implant failure.  We talked about our goals being decreased pain, improved mobility and overall improved quality of life.  DESCRIPTION OF PROCEDURE:  After informed consent was obtained and appropriate right hip was marked, she was brought to the operating room and sat up on a stretcher.  Spinal anesthesia was obtained.  She was then laid in the supine position on a  stretcher.  Foley catheter was placed and both feet were placed in line  skeletal traction boot with no traction applied.  She was placed supine on the Hana table.  A perineal post was then placed as well.  Her right operative hip was prepped and draped  with DuraPrep and sterile drapes.  A time-out was called and she was identified as correct patient, correct right hip.  I then made an incision just inferior and posterior to the anterior superior iliac spine and carried this obliquely down the leg.  We  dissected down to the tensor fascia lata muscle.  Tensor fascia was divided longitudinally to proceed with direct anterior approach to the hip.  We identified and cauterized circumflex vessels and identified the hip capsule, opened up the hip capsule in  an L-type format, finding moderate joint effusion and significant periarticular osteophytes around the femoral head and neck.  I placed Cobra retractors around the medial and lateral femoral neck and made our femoral neck cut with an oscillating saw and  completed this with an osteotome.  We placed a corkscrew guide in the femoral head and removed the femoral head in its entirety and found a wide area devoid of cartilage.  I then placed a bent Hohmann over the medial acetabular rim and removed remnants  of the acetabular labrum and other debris.  We then began reaming the acetabulum in stepwise increments from a size 44 reamer, going up to a size 51 with all reamers under direct visualization, the last reamer under direct fluoroscopy, so we could obtain  our depth of reaming, our inclination and anteversion.  I then placed the real DePuy Sector Gription acetabular component size 52 and a  36+0 neutral polyethylene liner.  Attention was then turned to the femur.  With the leg externally rotated to 120  degrees, extended and adducted, we were able to place a Mueller retractor medially and a Hohmann retractor above the greater trochanter.  We released the lateral joint capsule and we began broaching using the Corail broaching system  from a size 8 going  all the way up in stepwise increments to a size 11.  With a size 11 in place, we trialed a standard offset femoral neck and a 36+1.5 hip ball, rolled the leg back over and up and with traction, internal rotation, reducing the pelvis and we were pleased  with leg length, offset, range of motion and stability.  We then dislocated the hip and removed the trial components.  We then placed the real Corail femoral component size 11 with standard offset and the real 36+1.5 ceramic hip ball, again reduced this  into the acetabulum.  We were pleased with the leg length, offset, range of motion and stability assessed radiographically and mechanically.  We then irrigated the soft tissue with normal saline solution using pulsatile lavage.  We closed the joint  capsule with interrupted #1 Ethibond suture, followed by closing the tensor fascia with #1 Vicryl, 0 Vicryl was used to close the deep tissue, 2-0 Vicryl was used to cut to subcutaneous tissue and staples used to reapproximate the skin.  Xeroform and  Aquacel dressing was applied.  She was taken off the Hana table and taken to the recovery room in stable condition.  All final counts were correct.  There were no complications noted.  Note Benita Stabile, PA-C, assisted during the entire case.  His  assistance was crucial for facilitating all aspects of this case.  VN/NUANCE  D:05/02/2019 T:05/02/2019 JOB:[id number]

## 2019-05-02 NOTE — Anesthesia Procedure Notes (Signed)
Procedure Name: MAC Date/Time: 05/02/2019 10:01 AM Performed by: Eben Burow, CRNA Pre-anesthesia Checklist: Patient identified, Emergency Drugs available, Suction available, Patient being monitored and Timeout performed Oxygen Delivery Method: Simple face mask Dental Injury: Teeth and Oropharynx as per pre-operative assessment

## 2019-05-02 NOTE — Evaluation (Signed)
Physical Therapy Evaluation Patient Details Name: Eileen Green MRN: [id number] DOB: 1950/05/12 Today's Date: 05/02/2019   History of Present Illness  Patient is 69 y.o. 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    Clinical Impression  Eileen Green is a 69 y.o. female POD 0 s/p Rt THA. Patient reports independence with mobility at baseline. Patient is now limited by functional impairments (see PT problem list below) and requires min assist for transfers and gait with RW. Patient was able to ambulate ~70 feet with RW and min assist. Patient instructed in exercise to facilitate ROM and circulation. Patient will benefit from continued skilled PT interventions to address impairments and progress towards PLOF. Acute PT will follow to progress mobility and stair training in preparation for safe discharge home.     Follow Up Recommendations Follow surgeon's recommendation for DC plan and follow-up therapies;Home health PT    Equipment Recommendations  Rolling walker with 5" wheels;3in1 (PT)    Recommendations for Other Services       Precautions / Restrictions Precautions Precautions: Fall Restrictions Weight Bearing Restrictions: No Other Position/Activity Restrictions: WBAT      Mobility  Bed Mobility Overal bed mobility: Needs Assistance Bed Mobility: Supine to Sit     Supine to sit: Min assist;HOB elevated     General bed mobility comments: cues for sequencing with bed rail, assist for Rt LE mobility and to raise trunk.  Transfers Overall transfer level: Needs assistance Equipment used: Rolling walker (2 wheeled) Transfers: Sit to/from Stand Sit to Stand: Min assist;From elevated surface         General transfer comment: cues for technique with RW and assist to complete rise.  Ambulation/Gait Ambulation/Gait assistance: Min assist Gait Distance (Feet): 70 Feet Assistive device: Rolling walker  (2 wheeled) Gait Pattern/deviations: Step-to pattern;Decreased stride length;Decreased stance time - right;Decreased weight shift to right Gait velocity: decreased   General Gait Details: ceus for step pattern and assist for walker management, no overt LOB noted  Stairs            Wheelchair Mobility    Modified Rankin (Stroke Patients Only)       Balance Overall balance assessment: Mild deficits observed, not formally tested            Pertinent Vitals/Pain Pain Assessment: 0-10 Pain Score: 2  Pain Location: Rt hip Pain Descriptors / Indicators: Aching;Burning Pain Intervention(s): Limited activity within patient's tolerance;Ice applied;Repositioned;Monitored during session    Home Living Family/patient expects to be discharged to:: Private residence Living Arrangements: Spouse/significant other Available Help at Discharge: Family Type of Home: House Home Access: Stairs to enter Entrance Stairs-Rails: None Technical brewer of Steps: 2 Home Layout: One level Home Equipment: [place] - single point      Prior Function Level of Independence: Independent   Gait / Transfers Assistance Needed: using SPC for gait           Hand Dominance   Dominant Hand: Right    Extremity/Trunk Assessment   Upper Extremity Assessment Upper Extremity Assessment: Overall WFL for tasks assessed    Lower Extremity Assessment Lower Extremity Assessment: Overall WFL for tasks assessed    Cervical / Trunk Assessment Cervical / Trunk Assessment: Normal  Communication   Communication: No difficulties  Cognition Arousal/Alertness: Awake/alert Behavior During Therapy: WFL for tasks assessed/performed Overall Cognitive Status: Within Functional Limits for tasks assessed  General Comments      Exercises Total Joint Exercises Ankle Circles/Pumps: AROM;20 reps;Both;Seated Quad Sets: AROM;Right;5 reps;Seated Heel Slides: AAROM;Right;5 reps;Seated    Assessment/Plan    PT Assessment Patient needs continued PT services  PT Problem List Decreased strength;Decreased range of motion;Decreased activity tolerance;Decreased balance;Decreased mobility;Decreased knowledge of use of DME       PT Treatment Interventions DME instruction;Gait training;Stair training;Functional mobility training;Therapeutic activities;Therapeutic exercise;Balance training;Patient/family education    PT Goals (Current goals can be found in the Care Plan section)  Acute Rehab PT Goals Patient Stated Goal: to get independent again and host her daughters baby shower and go to the beach. PT Goal Formulation: With patient Time For Goal Achievement: 05/09/19 Potential to Achieve Goals: Good    Frequency 7X/week    AM-PAC PT "6 Clicks" Mobility  Outcome Measure Help needed turning from your back to your side while in a flat bed without using bedrails?: A Little Help needed moving from lying on your back to sitting on the side of a flat bed without using bedrails?: A Little Help needed moving to and from a bed to a chair (including a wheelchair)?: A Little Help needed standing up from a chair using your arms (e.g., wheelchair or bedside chair)?: A Little Help needed to walk in hospital room?: A Little Help needed climbing 3-5 steps with a railing? : A Little 6 Click Score: 18    End of Session Equipment Utilized During Treatment: Gait belt Activity Tolerance: Patient tolerated treatment well Patient left: in chair;with call bell/phone within reach;with chair alarm set;with family/visitor present Nurse Communication: Mobility status PT Visit Diagnosis: Muscle weakness (generalized) (M62.81);Difficulty in walking, not elsewhere classified (R26.2)    Time: [id number] PT Time Calculation (min) (ACUTE ONLY): 30 min   Charges:   PT Evaluation $PT Eval Low Complexity: 1 Low PT Treatments $Gait Training: 8-22 mins      Verner Mould, DPT Physical Therapist  with [facility] [phone]  05/02/2019 7:13 PM

## 2019-05-02 NOTE — Anesthesia Procedure Notes (Signed)
Spinal  Patient location during procedure: OR End time: 05/02/2019 10:15 AM Staffing Performed: anesthesiologist and resident/CRNA  Anesthesiologist: Annye Asa, MD Resident/CRNA: Eben Burow, CRNA Preanesthetic Checklist Completed: patient identified, IV checked, site marked, risks and benefits discussed, surgical consent, monitors and equipment checked, pre-op evaluation and timeout performed Spinal Block Patient position: sitting Prep: DuraPrep and site prepped and draped Patient monitoring: blood pressure, continuous pulse ox, cardiac monitor and heart rate Approach: midline Location: L3-4 Injection technique: single-shot Needle Needle type: Pencan  Needle gauge: 24 G Needle length: 9 cm Additional Notes Pt identified in Operating room.  Monitors applied. Working IV access confirmed. Sterile prep, drape lumbar spine.  1% lido local L 3,4 by CRNA, several attempts unsuccessful, then repeat local and #24ga Pencan into clear CSF L 3,4.  12mg  0.75% Bupivacaine with dextrose injected with asp CSF beginning and end of injection.  Patient asymptomatic, VSS, no heme aspirated, tolerated well.  Jenita Seashore, MD

## 2019-05-02 NOTE — Plan of Care (Signed)
Plan of care 

## 2019-05-02 NOTE — Anesthesia Postprocedure Evaluation (Signed)
Anesthesia Post Note  Patient: JAQUANA DIMOND  Procedure(s) Performed: RIGHT TOTAL HIP ARTHROPLASTY ANTERIOR APPROACH (Right Hip)     Patient location during evaluation: PACU Anesthesia Type: Spinal Level of consciousness: awake and alert, oriented and patient cooperative Pain management: pain level controlled Vital Signs Assessment: post-procedure vital signs reviewed and stable Respiratory status: spontaneous breathing, nonlabored ventilation and respiratory function stable Cardiovascular status: blood pressure returned to baseline and stable Postop Assessment: spinal receding, patient able to bend at knees and no apparent nausea or vomiting Anesthetic complications: no    Last Vitals:  Vitals:   05/02/19 1410 05/02/19 1510  BP: 127/73 (!) 149/80  Pulse: (!) 59 77  Resp: 20 16  Temp: 36.5 C   SpO2: 98% 99%    Last Pain:  Vitals:   05/02/19 1529  TempSrc:   PainSc: 2                  Cianna Kasparian,E. Daichi Moris

## 2019-05-02 NOTE — TOC Transition Note (Signed)
Transition of Care [facility]) - CM/SW Discharge Note   Patient Details  Name: Eileen Green MRN: [id number] Date of Birth: 1950-09-18  Transition of Care [facility]) CM/SW Contact:  Lennart Pall, LCSW Phone Number: 05/02/2019, 4:35 PM   Clinical Narrative:   Met with pt and spouse to review MD orders for DME and HH.  HHPT already pre-arranged with [facility].  Have ordered DME (rw and 3n1) from [place].  No further needs.    Final next level of care: [place] Barriers to Discharge: Continued Medical Work up   Patient Goals and CMS Choice Patient states their goals for this hospitalization and ongoing recovery are:: hopes to go home tomorrow CMS Medicare.gov Compare Post Acute Care list provided to:: Patient Choice offered to / list presented to : Patient  Discharge Placement                       Discharge Plan and Services                DME Arranged: 3-N-1, Walker rolling DME Agency: [employer] Date DME Agency Contacted: 05/02/19 Time DME Agency Contacted: [phone] Representative spoke with at DME Agency: [place]: PT [place]: [facility] (now [facility]) Date [place]: (pre-arranged with [facility] by MD office PTA)      Social Determinants of Health (SDOH) Interventions     Readmission Risk Interventions No flowsheet data found.

## 2019-05-02 NOTE — H&P (Signed)
TOTAL HIP ADMISSION H&P  Patient is admitted for right total hip arthroplasty.  Subjective:  Chief Complaint: right hip pain  HPI: Eileen Green, 69 y.o. female, has a history of pain and functional disability in the right hip(s) due to arthritis and patient has failed non-surgical conservative treatments for greater than 12 weeks to include NSAID's and/or analgesics, corticosteriod injections, viscosupplementation injections and activity modification.  Onset of symptoms was abrupt starting 1 years ago with rapidlly worsening course since that time.The patient noted no past surgery on the right hip(s).  Patient currently rates pain in the right hip at 10 out of 10 with activity. Patient has night pain, worsening of pain with activity and weight bearing, trendelenberg gait, pain that interfers with activities of daily living and pain with passive range of motion. Patient has evidence of subchondral cysts, subchondral sclerosis, periarticular osteophytes and joint space narrowing by imaging studies. This condition presents safety issues increasing the risk of falls.  There is no current active infection.  Patient Active Problem List   Diagnosis Date Noted  . Pre-operative clearance 03/31/2019  . Family history of coronary artery disease 03/31/2019  . Dysphagia 03/05/2019  . Abdominal pain, epigastric 03/05/2019  . Unilateral primary osteoarthritis, right hip 08/14/2018  . Epigastric discomfort 12/29/2016  . Dyslipidemia 01/13/2016  . Murmur 01/13/2016  . Essential hypertension 01/13/2016  . Hiatal hernia   . Dyspepsia 03/18/2014  . RLQ abdominal pain 08/08/2013  . 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 HYPOTHYROIDISM 11/05/2008  . Constipation 11/04/2008  . GERD 06/18/2008  . Chest pain 06/18/2008   Past Medical History:  Diagnosis Date  . Arthritis   . Cancer ([place])    skin melanoma under left eye  . Deaf    LEFT EAR  . GERD (gastroesophageal reflux disease)   .  Hyperlipidemia Deaf in Left ear  . Hypertension     Past Surgical History:  Procedure Laterality Date  . ABDOMINAL HYSTERECTOMY  2001   Partial  . COLONOSCOPY  01/21/2003   [id number] rectum/ Long capacious colon, moderate prep made the exam more challenging.  Marland Kitchen COLONOSCOPY  05/18/2009   Dr. Gala Romney: Normal rectum/narrow mouth left sided diverticula   . COLONOSCOPY N/A 05/14/2013   Dr. Gala Romney: hemorrhoids, colonic diverticula, redudant colon  . ESOPHAGOGASTRODUODENOSCOPY  01/21/2003   RMR:  Normal esophagus, stomach, D1 and D2  . ESOPHAGOGASTRODUODENOSCOPY N/A 03/24/2014   Dr. Gala Romney :Hiatal hernia; otherwise negative EGD  . TONSILLECTOMY      No current facility-administered medications for this encounter.   Current Outpatient Medications  Medication Sig Dispense Refill Last Dose  . Bacillus Coagulans-Inulin (PROBIOTIC-PREBIOTIC PO) Take 1 capsule by mouth daily. Digestive Advantage     . Carboxymethylcellulose Sodium (THERATEARS) 0.25 % SOLN Apply 1 drop to eye daily as needed (dry eyes).      . Cholecalciferol (VITAMIN D) 50 MCG (2000 UT) tablet Take 2,000 Units by mouth at bedtime.     Marland Kitchen ezetimibe (ZETIA) 10 MG tablet TAKE 1 TABLET(10 MG) BY MOUTH DAILY (Patient taking differently: Take 10 mg by mouth daily. ) 30 tablet 11   . famotidine (PEPCID) 20 MG tablet Take 1 tablet (20 mg total) by mouth at bedtime. 30 tablet 3   . Glucosamine-Chondroitin (OSTEO BI-FLEX REGULAR STRENGTH PO) Take by mouth.     . Methylcellulose, Laxative, 500 MG TABS Take 2 tablets by mouth daily.      . Misc Natural Products (OSTEO BI-FLEX TRIPLE STRENGTH PO) Take 1 tablet by mouth  daily.      . Multiple Vitamins-Minerals (ONE DAILY WOMENS 50 PLUS PO) Take 0.5 tablets by mouth See admin instructions. Mon, Wed Fri and Sat     . olopatadine (PATADAY) 0.1 % ophthalmic solution Place 1 drop into both eyes daily. For 14 days     . pantoprazole (PROTONIX) 40 MG tablet Take 1 tablet (40 mg total) by mouth 2 (two)  times daily before a meal. 60 tablet 3   . polyethylene glycol (MIRALAX / GLYCOLAX) packet Take 17 g by mouth daily.     . rosuvastatin (CRESTOR) 5 MG tablet Take 1 tablet by mouth once weekly. (Patient taking differently: Take 5 mg by mouth every Sunday. Take 1 tablet by mouth once weekly.) 15 tablet 3   . SYNTHROID 50 MCG tablet Take 1 tablet (50 mcg total) by mouth daily. 30 tablet 11   . valsartan (DIOVAN) 320 MG tablet Take 320 mg by mouth at bedtime.       No Known Allergies  Social History   Tobacco Use  . Smoking status: Never Smoker  . Smokeless tobacco: Never Used  . Tobacco comment: Never smoked  Substance Use Topics  . Alcohol use: No    Family History  Problem Relation Age of Onset  . Heart disease Mother 13  . Dementia Mother   . Kidney disease Father   . Thyroid nodules Father   . Colon cancer Neg Hx      Review of Systems  All other systems reviewed and are negative.   Objective:  Physical Exam  Constitutional: She is oriented to person, place, and time. She appears well-developed and well-nourished.  HENT:  Head: Normocephalic and atraumatic.  Eyes: Pupils are equal, round, and reactive to light. EOM are normal.  Cardiovascular: Normal rate.  Respiratory: Effort normal.  GI: Soft.  Musculoskeletal:     Cervical back: Normal range of motion and neck supple.     Right hip: Tenderness and bony tenderness present. Decreased range of motion. Decreased strength.  Neurological: She is alert and oriented to person, place, and time.  Skin: Skin is warm and dry.  Psychiatric: She has a normal mood and affect.    Vital signs in last 24 hours:    Labs:   Estimated body mass index is 31.21 kg/m as calculated from the following:   Height as of 04/28/19: 5\' 5"  (1.651 m).   Weight as of 04/28/19: 85.1 kg.   Imaging Review Plain radiographs demonstrate severe degenerative joint disease of the right hip(s). The bone quality appears to be excellent for age and  reported activity level.      Assessment/Plan:  End stage arthritis, right hip(s)  The patient history, physical examination, clinical judgement of the provider and imaging studies are consistent with end stage degenerative joint disease of the right hip(s) and total hip arthroplasty is deemed medically necessary. The treatment options including medical management, injection therapy, arthroscopy and arthroplasty were discussed at length. The risks and benefits of total hip arthroplasty were presented and reviewed. The risks due to aseptic loosening, infection, stiffness, dislocation/subluxation,  thromboembolic complications and other imponderables were discussed.  The patient acknowledged the explanation, agreed to proceed with the plan and consent was signed. Patient is being admitted for inpatient treatment for surgery, pain control, PT, OT, prophylactic antibiotics, VTE prophylaxis, progressive ambulation and ADL's and discharge planning.The patient is planning to be discharged home with home health services    Patient's anticipated LOS is less than 2  midnights, meeting these requirements: - Younger than 23 - Lives within 1 hour of care - Has a competent adult at home to recover with post-op recover - NO history of  - Chronic pain requiring opiods  - Diabetes  - Coronary Artery Disease  - Heart failure  - Heart attack  - Stroke  - DVT/VTE  - Cardiac arrhythmia  - Respiratory Failure/COPD  - Renal failure  - Anemia  - Advanced Liver disease

## 2019-05-02 NOTE — Brief Op Note (Signed)
05/02/2019  11:23 AM  PATIENT:  Eileen Green  69 y.o. female  PRE-OPERATIVE DIAGNOSIS:  Right hip endstage osteoarthritis  POST-OPERATIVE DIAGNOSIS:  Right hip endstage osteoarthritis  PROCEDURE:  Procedure(s): RIGHT TOTAL HIP ARTHROPLASTY ANTERIOR APPROACH (Right)  SURGEON:  Surgeon(s) and Role:    Mcarthur Rossetti, MD - Primary  PHYSICIAN ASSISTANT: Benita Stabile, PA-C  ANESTHESIA:   spinal  EBL:  300 mL   COUNTS:  YES  DICTATION: .Other Dictation: Dictation Number [id number]  PLAN OF CARE: Admit to inpatient   PATIENT DISPOSITION:  PACU - hemodynamically stable.   Delay start of Pharmacological VTE agent (>24hrs) due to surgical blood loss or risk of bleeding: no

## 2019-05-03 DIAGNOSIS — M1611 Unilateral primary osteoarthritis, right hip: Secondary | ICD-10-CM | POA: Diagnosis not present

## 2019-05-03 LAB — CBC
HCT: 37.2 % (ref 36.0–46.0)
Hemoglobin: 11.8 g/dL — ABNORMAL LOW (ref 12.0–15.0)
MCH: 29.4 pg (ref 26.0–34.0)
MCHC: 31.7 g/dL (ref 30.0–36.0)
MCV: 92.8 fL (ref 80.0–100.0)
Platelets: 300 10*3/uL (ref 150–400)
RBC: 4.01 MIL/uL (ref 3.87–5.11)
RDW: 13.2 % (ref 11.5–15.5)
WBC: 14 10*3/uL — ABNORMAL HIGH (ref 4.0–10.5)
nRBC: 0 % (ref 0.0–0.2)

## 2019-05-03 LAB — BASIC METABOLIC PANEL
Anion gap: 5 (ref 5–15)
BUN: 14 mg/dL (ref 8–23)
CO2: 24 mmol/L (ref 22–32)
Calcium: 9.9 mg/dL (ref 8.9–10.3)
Chloride: 111 mmol/L (ref 98–111)
Creatinine, Ser: 0.69 mg/dL (ref 0.44–1.00)
GFR calc Af Amer: >60 mL/min (ref >60)
GFR calc non Af Amer: >60 mL/min (ref >60)
Glucose, Bld: 132 mg/dL — ABNORMAL HIGH (ref 70–99)
Potassium: 4.6 mmol/L (ref 3.5–5.1)
Sodium: 140 mmol/L (ref 135–145)

## 2019-05-03 MED ORDER — OXYCODONE HCL 5 MG PO TABS: 5.0000 mg | ORAL_TABLET | ORAL | 0 refills | Status: DC | PRN

## 2019-05-03 MED ORDER — METHOCARBAMOL 500 MG PO TABS: 500.0000 mg | ORAL_TABLET | Freq: Four times a day (QID) | ORAL | 1 refills | Status: DC | PRN

## 2019-05-03 MED ORDER — ASPIRIN 81 MG PO CHEW: 81.0000 mg | CHEWABLE_TABLET | Freq: Two times a day (BID) | ORAL | 0 refills | Status: DC

## 2019-05-03 NOTE — Discharge Summary (Signed)
Patient ID: Eileen Green MRN: [id number] DOB/AGE: 07-01-1950 69 y.o.  Admit date: 05/02/2019 Discharge date: 05/03/2019  Admission Diagnoses:  Principal Problem:   Unilateral primary osteoarthritis, right hip Active Problems:   Status post total replacement of right hip   Discharge Diagnoses:  Same  Past Medical History:  Diagnosis Date  . Arthritis   . Cancer ([place])    skin melanoma under left eye  . Deaf    LEFT EAR  . GERD (gastroesophageal reflux disease)   . Hyperlipidemia Deaf in Left ear  . Hypertension     Surgeries: Procedure(s): RIGHT TOTAL HIP ARTHROPLASTY ANTERIOR APPROACH on 05/02/2019   Consultants:   Discharged Condition: Improved  Hospital Course: Eileen Green is an 69 y.o. female who was admitted 05/02/2019 for operative treatment ofUnilateral primary osteoarthritis, right hip. Patient has severe unremitting pain that affects sleep, daily activities, and work/hobbies. After pre-op clearance the patient was taken to the operating room on 05/02/2019 and underwent  Procedure(s): RIGHT TOTAL HIP ARTHROPLASTY ANTERIOR APPROACH.    Patient was given perioperative antibiotics:  Anti-infectives (From admission, onward)   Start     Dose/Rate Route Frequency Ordered Stop   05/02/19 1600  ceFAZolin (ANCEF) IVPB 1 g/50 mL premix     1 g 100 mL/hr over 30 Minutes Intravenous Every 6 hours 05/02/19 1410 05/02/19 2226   05/02/19 0745  ceFAZolin (ANCEF) IVPB 2g/100 mL premix     2 g 200 mL/hr over 30 Minutes Intravenous On call to O.R. 05/02/19 0731 05/02/19 1016       Patient was given sequential compression devices, early ambulation, and chemoprophylaxis to prevent DVT.  Patient benefited maximally from hospital stay and there were no complications.    Recent vital signs:  Patient Vitals for the past 24 hrs:  BP Temp Temp src Pulse Resp SpO2 Height Weight  05/03/19 0953 117/71 98.4 F (36.9 C) Oral 72 18 98 % -- --  05/03/19 0532 133/63 97.8 F (36.6 C) Oral  65 16 97 % -- --  05/03/19 0145 114/75 97.7 F (36.5 C) Oral 64 16 96 % -- --  05/02/19 2105 (!) 148/84 98.2 F (36.8 C) Oral 92 16 96 % -- --  05/02/19 1715 (!) 148/77 97.8 F (36.6 C) Oral 69 16 97 % -- --  05/02/19 1615 (!) 145/83 97.6 F (36.4 C) Oral 73 16 98 % -- --  05/02/19 1510 (!) 149/80 -- -- 77 16 99 % -- --  05/02/19 1410 127/73 97.7 F (36.5 C) Oral (!) 59 20 98 % 5\' 5"  (1.651 m) 85 kg  05/02/19 1330 120/68 (!) 97.5 F (36.4 C) -- (!) 58 14 100 % -- --  05/02/19 1315 125/69 -- -- 62 18 100 % -- --  05/02/19 1300 118/72 -- -- 65 17 100 % -- --  05/02/19 1245 115/64 -- -- (!) 59 13 100 % -- --  05/02/19 1230 118/71 -- -- 66 15 100 % -- --  05/02/19 1215 112/68 -- -- 63 12 100 % -- --  05/02/19 1200 113/72 -- -- 71 14 100 % -- --  05/02/19 1145 111/72 -- -- 80 20 100 % -- --  05/02/19 1135 118/67 (!) 97 F (36.1 C) -- 92 18 100 % -- --     Recent laboratory studies:  Recent Labs    05/03/19 0304  WBC 14.0*  HGB 11.8*  HCT 37.2  PLT 300  NA 140  K 4.6  CL 111  CO2 24  BUN 14  CREATININE 0.69  GLUCOSE 132*  CALCIUM 9.9     Discharge Medications:   Allergies as of 05/03/2019   No Known Allergies     Medication List    TAKE these medications   aspirin 81 MG chewable tablet Chew 1 tablet (81 mg total) by mouth 2 (two) times daily.   ezetimibe 10 MG tablet Commonly known as: ZETIA TAKE 1 TABLET(10 MG) BY MOUTH DAILY What changed: See the new instructions.   famotidine 20 MG tablet Commonly known as: Pepcid Take 1 tablet (20 mg total) by mouth at bedtime.   methocarbamol 500 MG tablet Commonly known as: ROBAXIN Take 1 tablet (500 mg total) by mouth every 6 (six) hours as needed for muscle spasms.   Methylcellulose (Laxative) 500 MG Tabs Take 2 tablets by mouth daily.   ONE DAILY WOMENS 50 PLUS PO Take 0.5 tablets by mouth See admin instructions. Mon, Wed Fri and Sat   OSTEO BI-FLEX REGULAR STRENGTH PO Take by mouth.   OSTEO BI-FLEX TRIPLE  STRENGTH PO Take 1 tablet by mouth daily.   oxyCODONE 5 MG immediate release tablet Commonly known as: Oxy IR/ROXICODONE Take 1-2 tablets (5-10 mg total) by mouth every 4 (four) hours as needed for moderate pain (pain score 4-6).   pantoprazole 40 MG tablet Commonly known as: PROTONIX Take 1 tablet (40 mg total) by mouth 2 (two) times daily before a meal.   Pataday 0.1 % ophthalmic solution Generic drug: olopatadine Place 1 drop into both eyes daily. For 14 days   polyethylene glycol 17 g packet Commonly known as: MIRALAX / GLYCOLAX Take 17 g by mouth daily.   PROBIOTIC-PREBIOTIC PO Take 1 capsule by mouth daily. Digestive Advantage   rosuvastatin 5 MG tablet Commonly known as: CRESTOR Take 1 tablet by mouth once weekly. What changed:   how much to take  how to take this  when to take this   Synthroid 50 MCG tablet Generic drug: levothyroxine Take 1 tablet (50 mcg total) by mouth daily.   Theratears 0.25 % Soln Generic drug: Carboxymethylcellulose Sodium Apply 1 drop to eye daily as needed (dry eyes).   valsartan 320 MG tablet Commonly known as: DIOVAN Take 320 mg by mouth at bedtime.   Vitamin D 50 MCG (2000 UT) tablet Take 2,000 Units by mouth at bedtime.            Durable Medical Equipment  (From admission, onward)         Start     Ordered   05/02/19 1411  DME 3 n 1  Once     05/02/19 1410   05/02/19 1411  DME Walker rolling  Once    Question Answer Comment  Walker: With 5 Inch Wheels   Patient needs a walker to treat with the following condition Status post total replacement of right hip      05/02/19 1410          Diagnostic Studies: DG Pelvis Portable  Result Date: 05/02/2019 CLINICAL DATA:  Status post hip arthroplasty EXAM: PORTABLE PELVIS 1-2 VIEWS COMPARISON:  Intraoperative radiographs May 02, 2019 FINDINGS: Frontal view mid to lower pelvis obtained. There is a total hip replacement on the right with prosthetic components on the  right well seated on frontal view. No fracture or dislocation. There is mild narrowing of the left hip joint. Soft tissue air on the right is an expected postoperative finding. There are skin staples on the right laterally.  IMPRESSION: Status post total hip replacement on the right with prosthetic components well-seated on frontal view. No fracture or dislocation. Mild narrowing left hip joint. Electronically Signed   By: Lowella Grip III M.D.   On: 05/02/2019 11:59   DG C-Arm 1-60 Min-No Report  Result Date: 05/02/2019 Fluoroscopy was utilized by the requesting physician.  No radiographic interpretation.   MYOCARDIAL PERFUSION IMAGING  Result Date: 04/03/2019  The left ventricular ejection fraction is normal (55-65%).  Nuclear stress EF: 59%.  There was no ST segment deviation noted during stress.  The study is normal.  This is a low risk study.  Compared to prior study, the cardiac counts are improved, on stress images able to tell there is no ischemia.    DG HIP OPERATIVE UNILAT W OR W/O PELVIS RIGHT  Result Date: 05/02/2019 CLINICAL DATA:  Right hip replacement EXAM: OPERATIVE RIGHT HIP (WITH PELVIS IF PERFORMED) 1 VIEWS TECHNIQUE: Fluoroscopic spot image(s) were submitted for interpretation post-operatively. COMPARISON:  03/26/2019 FINDINGS: Intraoperative fluoroscopic images of the right hip demonstrate right hip total arthroplasty. No evidence of perihardware fracture or component malposition. IMPRESSION: Intraoperative fluoroscopic images of the right hip demonstrate right hip total arthroplasty. No evidence of perihardware fracture or component malposition. Electronically Signed   By: Eddie Candle M.D.   On: 05/02/2019 11:41    Disposition: Discharge disposition: 01-Home or [facility] Follow up.   Specialty: [place] Why: to provide home health physical therapy Contact information: [address] [place] [address] [phone]        Mcarthur Rossetti, MD Follow up in 2 week(s).   Specialty: Orthopedic Surgery Contact information: [address] [place] [address] [phone]            Signed: Mcarthur Rossetti 05/03/2019, 10:56 AM

## 2019-05-03 NOTE — Progress Notes (Signed)
Subjective: 1 Day Post-Op Procedure(s) (LRB): RIGHT TOTAL HIP ARTHROPLASTY ANTERIOR APPROACH (Right) Patient reports pain as moderate.    Objective: Vital signs in last 24 hours: Temp:  [97 F (36.1 C)-98.4 F (36.9 C)] 98.4 F (36.9 C) (05/01 0953) Pulse Rate:  [58-92] 72 (05/01 0953) Resp:  [12-20] 18 (05/01 0953) BP: (111-149)/(63-84) 117/71 (05/01 0953) SpO2:  [96 %-100 %] 98 % (05/01 0953) Weight:  [85 kg] 85 kg (04/30 1410)  Intake/Output from previous day: 04/30 0701 - 05/01 0700 In: 3916.3 [P.O.:720; I.V.:2846.3; IV Piggyback:350] Out: 2650 [Urine:2350; Blood:300] Intake/Output this shift: Total I/O In: 748 [P.O.:720; I.V.:28] Out: 400 [Urine:400]  Recent Labs    05/03/19 0304  HGB 11.8*   Recent Labs    05/03/19 0304  WBC 14.0*  RBC 4.01  HCT 37.2  PLT 300   Recent Labs    05/03/19 0304  NA 140  K 4.6  CL 111  CO2 24  BUN 14  CREATININE 0.69  GLUCOSE 132*  CALCIUM 9.9   No results for input(s): LABPT, INR in the last 72 hours.  Sensation intact distally Intact pulses distally Dorsiflexion/Plantar flexion intact Incision: dressing C/D/I   Assessment/Plan: 1 Day Post-Op Procedure(s) (LRB): RIGHT TOTAL HIP ARTHROPLASTY ANTERIOR APPROACH (Right) Up with therapy Discharge home with home health    Patient's anticipated LOS is less than 2 midnights, meeting these requirements: - Younger than 26 - Lives within 1 hour of care - Has a competent adult at home to recover with post-op recover - NO history of  - Chronic pain requiring opiods  - Diabetes  - Coronary Artery Disease  - Heart failure  - Heart attack  - Stroke  - DVT/VTE  - Cardiac arrhythmia  - Respiratory Failure/COPD  - Renal failure  - Anemia  - Advanced Liver disease       Mcarthur Rossetti 05/03/2019, 10:53 AM

## 2019-05-03 NOTE — Plan of Care (Signed)
Plan of care reviewed and discussed with the patient. 

## 2019-05-03 NOTE — Progress Notes (Signed)
Physical Therapy Treatment Patient Details Name: Eileen Green MRN: [id number] DOB: 1950-02-02 Today's Date: 05/03/2019    History of Present Illness Patient is 69 y.o. 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    PT Comments    POD # 1 am session Assisted OOB.  General bed mobility comments: demonstrated and instructed how to use a belt to self assist LE.  Assisted with amb.  General transfer comment: 25% VC's on proper hand placement and increased time.  General Gait Details: 25% VC's on proper sequencing, proper walker to self distance and upright posture.  Practiced stairs with daughter.  Then returned to room to perform some TE's following HEP handout.  Instructed on proper tech, freq as well as use of ICE.  Pt will need another PT session to complete HEP and practice stairs with spouse who is coming after lunch.    Follow Up Recommendations  Follow surgeon's recommendation for DC plan and follow-up therapies;Home health PT     Equipment Recommendations  Rolling walker with 5" wheels;3in1 (PT)    Recommendations for Other Services       Precautions / Restrictions Precautions Precautions: Fall Restrictions Weight Bearing Restrictions: No Other Position/Activity Restrictions: WBAT    Mobility  Bed Mobility Overal bed mobility: Needs Assistance Bed Mobility: Supine to Sit     Supine to sit: Supervision;Min guard     General bed mobility comments: demonstrated and instructed how to use a belt to self assist LE  Transfers Overall transfer level: Needs assistance Equipment used: Rolling walker (2 wheeled) Transfers: Sit to/from Stand Sit to Stand: Supervision;Min guard         General transfer comment: 25% VC's on proper hand placement and increased time  Ambulation/Gait Ambulation/Gait assistance: Supervision;Min guard Gait Distance (Feet): 45 Feet Assistive device: Rolling walker (2  wheeled) Gait Pattern/deviations: Step-to pattern;Decreased stride length;Decreased stance time - right;Decreased weight shift to right Gait velocity: decreased   General Gait Details: 25% VC's on proper sequencing, proper walker to self distance and upright posture   Stairs    2 steps forward with walker with daughter assisting from behind with 50% VC's on proper tech and safety         Wheelchair Mobility    Modified Rankin (Stroke Patients Only)       Balance                                            Cognition Arousal/Alertness: Awake/alert Behavior During Therapy: WFL for tasks assessed/performed Overall Cognitive Status: Within Functional Limits for tasks assessed                                        Exercises   Total Hip Replacement TE's following HEP Handout 10 reps ankle pumps 05 reps knee presses 05 reps heel slides 05 reps SAQ's 05 reps ABD Instructed how to use a belt loop to assist  Followed by ICE     General Comments        Pertinent Vitals/Pain Pain Assessment: 0-10 Pain Score: 3  Pain Location: Rt hip Pain Descriptors / Indicators: Aching;Burning;Tender;Operative site guarding Pain Intervention(s): Monitored during session;Premedicated before session;Repositioned;Ice applied    Home Living  Prior Function            PT Goals (current goals can now be found in the care plan section) Progress towards PT goals: Progressing toward goals    Frequency    7X/week      PT Plan Current plan remains appropriate    Co-evaluation              AM-PAC PT "6 Clicks" Mobility   Outcome Measure  Help needed turning from your back to your side while in a flat bed without using bedrails?: A Little Help needed moving from lying on your back to sitting on the side of a flat bed without using bedrails?: A Little Help needed moving to and from a bed to a chair (including a  wheelchair)?: A Little Help needed standing up from a chair using your arms (e.g., wheelchair or bedside chair)?: A Little Help needed to walk in hospital room?: A Little Help needed climbing 3-5 steps with a railing? : A Little 6 Click Score: 18    End of Session Equipment Utilized During Treatment: Gait belt Activity Tolerance: Patient tolerated treatment well Patient left: in chair;with call bell/phone within reach;with chair alarm set;with family/visitor present Nurse Communication: Mobility status PT Visit Diagnosis: Muscle weakness (generalized) (M62.81);Difficulty in walking, not elsewhere classified (R26.2)     Time: [id number] PT Time Calculation (min) (ACUTE ONLY): 45 min  Charges:  $Gait Training: 8-22 mins $Therapeutic Exercise: 8-22 mins $Therapeutic Activity: 8-22 mins                     Rica Koyanagi  PTA Acute  Rehabilitation Services Pager      [phone] Office      [phone]

## 2019-05-03 NOTE — Plan of Care (Signed)
  Problem: Clinical Measurements: Goal: Respiratory complications will improve Outcome: Progressing   Problem: Clinical Measurements: Goal: Cardiovascular complication will be avoided Outcome: Progressing   Problem: Activity: Goal: Risk for activity intolerance will decrease Outcome: Progressing   Problem: Coping: Goal: Level of anxiety will decrease Outcome: Progressing   Problem: Elimination: Goal: Will not experience complications related to bowel motility Outcome: Progressing   Problem: Safety: Goal: Ability to remain free from injury will improve Outcome: Progressing

## 2019-05-03 NOTE — Progress Notes (Signed)
Physical Therapy Treatment Patient Details Name: Eileen Green MRN: [id number] DOB: [birth year] Today's Date: 05/03/2019    History of Present Illness Patient is 69 y.o. 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    PT Comments    POD # 1 pm session Spouse present.  Assisted with amb, stairs and standing TE's.  Addressed all mobility questions, discussed appropriate activity, educated on use of ICE.  Pt ready for D/C to home.   Follow Up Recommendations  Follow surgeon's recommendation for DC plan and follow-up therapies;Home health PT     Equipment Recommendations  Rolling walker with 5" wheels;3in1 (PT)    Recommendations for Other Services       Precautions / Restrictions Precautions Precautions: Fall Restrictions Weight Bearing Restrictions: No Other Position/Activity Restrictions: WBAT    Mobility  Bed Mobility Overal bed mobility: Needs Assistance Bed Mobility: Supine to Sit     Supine to sit: Supervision;Min guard     General bed mobility comments: OOB in recliner  Transfers Overall transfer level: Needs assistance Equipment used: Rolling walker (2 wheeled) Transfers: Sit to/from Stand Sit to Stand: Supervision;Min guard         General transfer comment: 25% VC's on proper hand placement and increased time  Ambulation/Gait Ambulation/Gait assistance: Supervision;Min guard Gait Distance (Feet): 55 Feet Assistive device: Rolling walker (2 wheeled) Gait Pattern/deviations: Step-to pattern;Decreased stride length;Decreased stance time - right;Decreased weight shift to right Gait velocity: decreased   General Gait Details: 25% VC's on proper sequencing, proper walker to self distance and upright posture   Stairs Stairs: Yes Stairs assistance: Min assist Stair Management: No rails;Step to pattern;Forwards;With walker Number of Stairs: 2 General stair comments: with spouse  present for "hands on " instruction on proper walker placeemnt, proper sequencing and safety.   Practiced twice.  Pt did well.   Wheelchair Mobility    Modified Rankin (Stroke Patients Only)       Balance                                            Cognition Arousal/Alertness: Awake/alert Behavior During Therapy: WFL for tasks assessed/performed Overall Cognitive Status: Within Functional Limits for tasks assessed                                        Exercises      General Comments        Pertinent Vitals/Pain Pain Assessment: 0-10 Pain Score: 3  Pain Location: Rt hip Pain Descriptors / Indicators: Aching;Burning;Tender;Operative site guarding Pain Intervention(s): Monitored during session;Premedicated before session;Repositioned;Ice applied    Home Living                      Prior Function            PT Goals (current goals can now be found in the care plan section) Progress towards PT goals: Progressing toward goals    Frequency    7X/week      PT Plan Current plan remains appropriate    Co-evaluation              AM-PAC PT "6 Clicks" Mobility   Outcome Measure  Help needed turning from  your back to your side while in a flat bed without using bedrails?: A Little Help needed moving from lying on your back to sitting on the side of a flat bed without using bedrails?: A Little Help needed moving to and from a bed to a chair (including a wheelchair)?: A Little Help needed standing up from a chair using your arms (e.g., wheelchair or bedside chair)?: A Little Help needed to walk in hospital room?: A Little Help needed climbing 3-5 steps with a railing? : A Little 6 Click Score: 18    End of Session Equipment Utilized During Treatment: Gait belt Activity Tolerance: Patient tolerated treatment well Patient left: in chair;with call bell/phone within reach;with chair alarm set;with family/visitor  present Nurse Communication: Mobility status PT Visit Diagnosis: Muscle weakness (generalized) (M62.81);Difficulty in walking, not elsewhere classified (R26.2)     Time: 1320-1350 PT Time Calculation (min) (ACUTE ONLY): 30 min  Charges:  $Gait Training: 8-22 mins $Therapeutic Activity: 8-22 mins                     Rica Koyanagi  PTA Acute  Rehabilitation Services Pager      [phone] Office      [phone]

## 2019-05-03 NOTE — Discharge Instructions (Signed)

## 2019-05-05 ENCOUNTER — Encounter: Payer: Self-pay | Admitting: *Deleted

## 2019-05-12 ENCOUNTER — Telehealth: Payer: Self-pay | Admitting: *Deleted

## 2019-05-12 NOTE — Telephone Encounter (Signed)
Pt consented to a virtual visit. 

## 2019-05-12 NOTE — Telephone Encounter (Signed)
Eileen Green, you are scheduled for a virtual visit with your provider today.  Just as we do with appointments in the office, we must obtain your consent to participate.  Your consent will be active for this visit and any virtual visit you may have with one of our providers in the next 365 days.  If you have a MyChart account, I can also send a copy of this consent to you electronically.  All virtual visits are billed to your insurance company just like a traditional visit in the office.  As this is a virtual visit, video technology does not allow for your provider to perform a traditional examination.  This may limit your provider's ability to fully assess your condition.  If your provider identifies any concerns that need to be evaluated in person or the need to arrange testing such as labs, EKG, etc, we will make arrangements to do so.  Although advances in technology are sophisticated, we cannot ensure that it will always work on either your end or our end.  If the connection with a video visit is poor, we may have to switch to a telephone visit.  With either a video or telephone visit, we are not always able to ensure that we have a secure connection.   I need to obtain your verbal consent now.   Are you willing to proceed with your visit today?

## 2019-05-15 ENCOUNTER — Ambulatory Visit (INDEPENDENT_AMBULATORY_CARE_PROVIDER_SITE_OTHER): Payer: Federal, State, Local not specified - PPO | Admitting: Orthopaedic Surgery

## 2019-05-15 ENCOUNTER — Other Ambulatory Visit: Payer: Self-pay

## 2019-05-15 ENCOUNTER — Encounter: Payer: Self-pay | Admitting: Orthopaedic Surgery

## 2019-05-15 DIAGNOSIS — Z96641 Presence of right artificial hip joint: Secondary | ICD-10-CM

## 2019-05-15 NOTE — Progress Notes (Signed)
The patient is status post a right total hip arthroplasty be done 2 weeks ago tomorrow.  She has been taking a baby aspirin twice a day and doing well overall.  She is now down to just Tylenol and Robaxin.  She is ambulate with a cane.  On exam she has good range of motion of the right hip.  The staples were removed and Steri-Strips applied to the right hip incision.  Her leg lengths are equal when I have her lay supine.  She will continue increase her activities as comfort allows.  She will be down to baby aspirin once a day for a week and then can stop her aspirin.  I will refill her medications when she needs time.  All questions and concerns were answered and addressed.  I would like to see her back in a month to make sure she is doing well but no x-rays are needed.

## 2019-05-16 ENCOUNTER — Telehealth: Payer: Self-pay | Admitting: Orthopaedic Surgery

## 2019-05-16 NOTE — Telephone Encounter (Signed)
Patient called stating that the stripes are coming off and just had them put on yesterday.  She is wanting to know how long they are suppose to last and if they come off, what is an alternative covering for the incision and can she buy the stripes at [facility].  [phone].  Thank you.

## 2019-05-16 NOTE — Telephone Encounter (Signed)
If she can come in and get some more put on that will be fine.  Also give her reassurance that should be fine with him being off as long as she is not scrubbing on her incision.

## 2019-05-16 NOTE — Telephone Encounter (Signed)
Patient aware of the below message  

## 2019-05-16 NOTE — Telephone Encounter (Signed)
Do I need to have her come in to get more or is she fine without them

## 2019-05-23 ENCOUNTER — Encounter: Payer: Self-pay | Admitting: Gastroenterology

## 2019-05-23 ENCOUNTER — Telehealth (INDEPENDENT_AMBULATORY_CARE_PROVIDER_SITE_OTHER): Payer: Federal, State, Local not specified - PPO | Admitting: Gastroenterology

## 2019-05-23 DIAGNOSIS — K449 Diaphragmatic hernia without obstruction or gangrene: Secondary | ICD-10-CM

## 2019-05-23 DIAGNOSIS — K219 Gastro-esophageal reflux disease without esophagitis: Secondary | ICD-10-CM

## 2019-05-23 NOTE — Patient Instructions (Signed)
1. Continue pantoprazole 40mg  twice daily for now. Best to take before breakfast and evening meal.  2. You can try coming off of Pepcid at bedtime. However, if you have recurrent reflux symptoms it is okay to stay on chronically or you can use as needed (no more than twice daily).  3. The goal is to have adequate control of your reflux symptoms on the least amount of medication. Ideally we would get you down to pantoprazole once a day if tolerated.  4. See information below regarding dietary and behavioral modifications that are helpful.  5. Return to the office in one year or sooner if needed.    Hiatal Hernia  A hiatal hernia occurs when part of the stomach slides above the muscle that separates the abdomen from the chest (diaphragm). A person can be born with a hiatal hernia (congenital), or it may develop over time. In almost all cases of hiatal hernia, only the top part of the stomach pushes through the diaphragm. Many people have a hiatal hernia with no symptoms. The larger the hernia, the more likely it is that you will have symptoms. In some cases, a hiatal hernia allows stomach acid to flow back into the tube that carries food from your mouth to your stomach (esophagus). This may cause heartburn symptoms. Severe heartburn symptoms may mean that you have developed a condition called gastroesophageal reflux disease (GERD). What are the causes? This condition is caused by a weakness in the opening (hiatus) where the esophagus passes through the diaphragm to attach to the upper part of the stomach. A person may be born with a weakness in the hiatus, or a weakness can develop over time. What increases the risk? This condition is more likely to develop in:  Older people. Age is a major risk factor for a hiatal hernia, especially if you are over the age of 98.  Pregnant women.  People who are overweight.  People who have frequent constipation. What are the signs or symptoms? Symptoms of  this condition usually develop in the form of GERD symptoms. Symptoms include:  Heartburn.  Belching.  Indigestion.  Trouble swallowing.  Coughing or wheezing.  Sore throat.  Hoarseness.  Chest pain.  Nausea and vomiting. How is this diagnosed? This condition may be diagnosed during testing for GERD. Tests that may be done include:  X-rays of your stomach or chest.  An upper gastrointestinal (GI) series. This is an X-ray exam of your GI tract that is taken after you swallow a chalky liquid that shows up clearly on the X-ray.  Endoscopy. This is a procedure to look into your stomach using a thin, flexible tube that has a tiny camera and light on the end of it. How is this treated? This condition may be treated by:  Dietary and lifestyle changes to help reduce GERD symptoms.  Medicines. These may include: ? Over-the-counter antacids. ? Medicines that make your stomach empty more quickly. ? Medicines that block the production of stomach acid (H2 blockers). ? Stronger medicines to reduce stomach acid (proton pump inhibitors).  Surgery to repair the hernia, if other treatments are not helping. If you have no symptoms, you may not need treatment. Follow these instructions at home: Lifestyle and activity  Do not use any products that contain nicotine or tobacco, such as cigarettes and e-cigarettes. If you need help quitting, ask your health care provider.  Try to achieve and maintain a healthy body weight.  Avoid putting pressure on your abdomen. Anything  that puts pressure on your abdomen increases the amount of acid that may be pushed up into your esophagus. ? Avoid bending over, especially after eating. ? Raise the head of your bed by putting blocks under the legs. This keeps your head and esophagus higher than your stomach. ? Do not wear tight clothing around your chest or stomach. ? Try not to strain when having a bowel movement, when urinating, or when lifting heavy  objects. Eating and drinking  Avoid foods that can worsen GERD symptoms. These may include: ? Fatty foods, like fried foods. ? Citrus fruits, like oranges or lemon. ? Other foods and drinks that contain acid, like orange juice or tomatoes. ? Spicy food. ? Chocolate.  Eat frequent small meals instead of three large meals a day. This helps prevent your stomach from getting too full. ? Eat slowly. ? Do not lie down right after eating. ? Do not eat 1-2 hours before bed.  Do not drink beverages with caffeine. These include cola, coffee, cocoa, and tea.  Do not drink alcohol. General instructions  Take over-the-counter and prescription medicines only as told by your health care provider.  Keep all follow-up visits as told by your health care provider. This is important. Contact a health care provider if:  Your symptoms are not controlled with medicines or lifestyle changes.  You are having trouble swallowing.  You have coughing or wheezing that will not go away. Get help right away if:  Your pain is getting worse.  Your pain spreads to your arms, neck, jaw, teeth, or back.  You have shortness of breath.  You sweat for no reason.  You feel sick to your stomach (nauseous) or you vomit.  You vomit blood.  You have bright red blood in your stools.  You have black, tarry stools. This information is not intended to replace advice given to you by your health care provider. Make sure you discuss any questions you have with your health care provider. Document Revised: 12/01/2016 Document Reviewed: 07/24/2016 Elsevier Patient Education  2020 [place] for Gastroesophageal Reflux Disease, Adult When you have gastroesophageal reflux disease (GERD), the foods you eat and your eating habits are very important. Choosing the right foods can help ease the discomfort of GERD. Consider working with a diet and nutrition specialist (dietitian) to help you make healthy  food choices. What general guidelines should I follow?  Eating plan  Choose healthy foods low in fat, such as fruits, vegetables, whole grains, low-fat dairy products, and lean meat, fish, and poultry.  Eat frequent, small meals instead of three large meals each day. Eat your meals slowly, in a relaxed setting. Avoid bending over or lying down until 2-3 hours after eating.  Limit high-fat foods such as fatty meats or fried foods.  Limit your intake of oils, butter, and shortening to less than 8 teaspoons each day.  Avoid the following: ? Foods that cause symptoms. These may be different for different people. Keep a food diary to keep track of foods that cause symptoms. ? Alcohol. ? Drinking large amounts of liquid with meals. ? Eating meals during the 2-3 hours before bed.  Cook foods using methods other than frying. This may include baking, grilling, or broiling. Lifestyle  Maintain a healthy weight. Ask your health care provider what weight is healthy for you. If you need to lose weight, work with your health care provider to do so safely.  Exercise for at least 30 minutes  on 5 or more days each week, or as told by your health care provider.  Avoid wearing clothes that fit tightly around your waist and chest.  Do not use any products that contain nicotine or tobacco, such as cigarettes and e-cigarettes. If you need help quitting, ask your health care provider.  Sleep with the head of your bed raised. Use a wedge under the mattress or blocks under the bed frame to raise the head of the bed. What foods are not recommended? The items listed may not be a complete list. Talk with your dietitian about what dietary choices are best for you. Grains Pastries or quick breads with added fat. [place] toast. Vegetables Deep fried vegetables. [place] fries. Any vegetables prepared with added fat. Any vegetables that cause symptoms. For some people this may include tomatoes and tomato products,  chili peppers, onions and garlic, and horseradish. Fruits Any fruits prepared with added fat. Any fruits that cause symptoms. For some people this may include citrus fruits, such as oranges, grapefruit, pineapple, and lemons. Meats and other protein foods High-fat meats, such as fatty beef or pork, hot dogs, ribs, ham, sausage, salami and bacon. Fried meat or protein, including fried fish and fried chicken. Nuts and nut butters. Dairy Whole milk and chocolate milk. Sour cream. Cream. Ice cream. Cream cheese. Milk shakes. Beverages Coffee and tea, with or without caffeine. Carbonated beverages. Sodas. Energy drinks. Fruit juice made with acidic fruits (such as orange or grapefruit). Tomato juice. Alcoholic drinks. Fats and oils Butter. Margarine. Shortening. Ghee. Sweets and desserts Chocolate and cocoa. Donuts. Seasoning and other foods Pepper. Peppermint and spearmint. Any condiments, herbs, or seasonings that cause symptoms. For some people, this may include curry, hot sauce, or vinegar-based salad dressings. Summary  When you have gastroesophageal reflux disease (GERD), food and lifestyle choices are very important to help ease the discomfort of GERD.  Eat frequent, small meals instead of three large meals each day. Eat your meals slowly, in a relaxed setting. Avoid bending over or lying down until 2-3 hours after eating.  Limit high-fat foods such as fatty meat or fried foods. This information is not intended to replace advice given to you by your health care provider. Make sure you discuss any questions you have with your health care provider. Document Revised: 04/11/2018 Document Reviewed: 12/21/2015 Elsevier Patient Education  [place].    Gastroesophageal Reflux Disease, Adult Gastroesophageal reflux (GER) happens when acid from the stomach flows up into the tube that connects the mouth and the stomach (esophagus). Normally, food travels down the esophagus and stays in  the stomach to be digested. However, when a person has GER, food and stomach acid sometimes move back up into the esophagus. If this becomes a more serious problem, the person may be diagnosed with a disease called gastroesophageal reflux disease (GERD). GERD occurs when the reflux:  Happens often.  Causes frequent or severe symptoms.  Causes problems such as damage to the esophagus. When stomach acid comes in contact with the esophagus, the acid may cause soreness (inflammation) in the esophagus. Over time, GERD may create small holes (ulcers) in the lining of the esophagus. What are the causes? This condition is caused by a problem with the muscle between the esophagus and the stomach (lower esophageal sphincter, or LES). Normally, the LES muscle closes after food passes through the esophagus to the stomach. When the LES is weakened or abnormal, it does not close properly, and that allows food and stomach  acid to go back up into the esophagus. The LES can be weakened by certain dietary substances, medicines, and medical conditions, including:  Tobacco use.  Pregnancy.  Having a hiatal hernia.  Alcohol use.  Certain foods and beverages, such as coffee, chocolate, onions, and peppermint. What increases the risk? You are more likely to develop this condition if you:  Have an increased body weight.  Have a connective tissue disorder.  Use NSAID medicines. What are the signs or symptoms? Symptoms of this condition include:  Heartburn.  Difficult or painful swallowing.  The feeling of having a lump in the throat.  Abitter taste in the mouth.  Bad breath.  Having a large amount of saliva.  Having an upset or bloated stomach.  Belching.  Chest pain. Different conditions can cause chest pain. Make sure you see your health care provider if you experience chest pain.  Shortness of breath or wheezing.  Ongoing (chronic) cough or a night-time cough.  Wearing away of tooth  enamel.  Weight loss. How is this diagnosed? Your health care provider will take a medical history and perform a physical exam. To determine if you have mild or severe GERD, your health care provider may also monitor how you respond to treatment. You may also have tests, including:  A test to examine your stomach and esophagus with a small camera (endoscopy).  A test thatmeasures the acidity level in your esophagus.  A test thatmeasures how much pressure is on your esophagus.  A barium swallow or modified barium swallow test to show the shape, size, and functioning of your esophagus. How is this treated? The goal of treatment is to help relieve your symptoms and to prevent complications. Treatment for this condition may vary depending on how severe your symptoms are. Your health care provider may recommend:  Changes to your diet.  Medicine.  Surgery. Follow these instructions at home: Eating and drinking   Follow a diet as recommended by your health care provider. This may involve avoiding foods and drinks such as: ? Coffee and tea (with or without caffeine). ? Drinks that containalcohol. ? Energy drinks and sports drinks. ? Carbonated drinks or sodas. ? Chocolate and cocoa. ? Peppermint and mint flavorings. ? Garlic and onions. ? Horseradish. ? Spicy and acidic foods, including peppers, chili powder, curry powder, vinegar, hot sauces, and barbecue sauce. ? Citrus fruit juices and citrus fruits, such as oranges, lemons, and limes. ? Tomato-based foods, such as red sauce, chili, salsa, and pizza with red sauce. ? Fried and fatty foods, such as donuts, french fries, potato chips, and high-fat dressings. ? High-fat meats, such as hot dogs and fatty cuts of red and white meats, such as rib eye steak, sausage, ham, and bacon. ? High-fat dairy items, such as whole milk, butter, and cream cheese.  Eat small, frequent meals instead of large meals.  Avoid drinking large amounts  of liquid with your meals.  Avoid eating meals during the 2-3 hours before bedtime.  Avoid lying down right after you eat.  Do not exercise right after you eat. Lifestyle   Do not use any products that contain nicotine or tobacco, such as cigarettes, e-cigarettes, and chewing tobacco. If you need help quitting, ask your health care provider.  Try to reduce your stress by using methods such as yoga or meditation. If you need help reducing stress, ask your health care provider.  If you are overweight, reduce your weight to an amount that is healthy for you.  Ask your health care provider for guidance about a safe weight loss goal. General instructions  Pay attention to any changes in your symptoms.  Take over-the-counter and prescription medicines only as told by your health care provider. Do not take aspirin, ibuprofen, or other NSAIDs unless your health care provider told you to do so.  Wear loose-fitting clothing. Do not wear anything tight around your waist that causes pressure on your abdomen.  Raise (elevate) the head of your bed about 6 inches (15 cm).  Avoid bending over if this makes your symptoms worse.  Keep all follow-up visits as told by your health care provider. This is important. Contact a health care provider if:  You have: ? New symptoms. ? Unexplained weight loss. ? Difficulty swallowing or it hurts to swallow. ? Wheezing or a persistent cough. ? A hoarse voice.  Your symptoms do not improve with treatment. Get help right away if you:  Have pain in your arms, neck, jaw, teeth, or back.  Feel sweaty, dizzy, or light-headed.  Have chest pain or shortness of breath.  Vomit and your vomit looks like blood or coffee grounds.  Faint.  Have stool that is bloody or black.  Cannot swallow, drink, or eat. Summary  Gastroesophageal reflux happens when acid from the stomach flows up into the esophagus. GERD is a disease in which the reflux happens often,  causes frequent or severe symptoms, or causes problems such as damage to the esophagus.  Treatment for this condition may vary depending on how severe your symptoms are. Your health care provider may recommend diet and lifestyle changes, medicine, or surgery.  Contact a health care provider if you have new or worsening symptoms.  Take over-the-counter and prescription medicines only as told by your health care provider. Do not take aspirin, ibuprofen, or other NSAIDs unless your health care provider told you to do so.  Keep all follow-up visits as told by your health care provider. This is important. This information is not intended to replace advice given to you by your health care provider. Make sure you discuss any questions you have with your health care provider. Document Revised: 06/27/2017 Document Reviewed: 06/27/2017 Elsevier Patient Education  [place].

## 2019-05-23 NOTE — Progress Notes (Signed)
Primary Care Physician:  Asencion Noble, MD Primary GI:  Garfield Cornea, MD   Patient Location: Home  Provider Location: [facility] office  Reason for Visit:  Chief Complaint  Patient presents with  . Follow-up    fu indigestion     Persons present on the virtual encounter, with roles: Patient, myself (provider),Angela Stallings CMA (updated meds and allergies)  Total time (minutes) spent on medical discussion: 15 minutes  Due to COVID-19, visit was conducted using Mychart video method.  Visit was requested by patient.  Virtual Visit via Mychart video  I connected with Eileen Green on 05/23/19 at 11:00 AM EDT by Mychart video and verified that I am speaking with the correct person using two identifiers.   I discussed the limitations, risks, security and privacy concerns of performing an evaluation and management service by telephone/video and the availability of in person appointments. I also discussed with the patient that there may be a patient responsible charge related to this service. The patient expressed understanding and agreed to proceed.   HPI:   Eileen Green is a 69 y.o. female who presents for virtual visit regarding GERD.  Seen back in March for 2-year follow-up of chronic GERD.  Typical symptoms well controlled on pantoprazole.  She had started noticing sensation of food sticking in the chest after meals.  Last EGD in 2016 was fairly unremarkable.  At that time we offered EGD but she elected for barium study.  GI series showed significant reflux up into the entire esophagus, moderate sized sliding hiatal hernia but no stricture or ulcer.  We elected to increase her pantoprazole to twice daily.  No EGD advised at that time as discussed with Dr. Gala Romney.  Since then adjustments have been made to her GERD regimen.  She continued pantoprazole 40 mg before breakfast and supper and we added Pepcid 20 mg at bedtime.  Reinforced the need to avoid drinking or eating for 2 to 3 hours  prior to laying down as most of her symptoms are nocturnal.  She recently underwent right total hip replacement on May 02, 2019. Doing well.   From a GI standpoint she is doing well.  Not really having any issues with her reflux.  Gave up eating a bowl of ice cream at night.  Doesn't eat after 8 oclock.  She believes the biggest difference in the control of her symptoms is making sure she does not eat chocolate ice cream in the evenings and really not eating anything 3 hours prior to laying down. BM regular. No melena, brbpr. After surgery had some constipation and hemorrhoids got irritated but doing better now.    Current Outpatient Medications  Medication Sig Dispense Refill  . Bacillus Coagulans-Inulin (PROBIOTIC-PREBIOTIC PO) Take 1 capsule by mouth daily. Digestive Advantage    . Carboxymethylcellulose Sodium (THERATEARS) 0.25 % SOLN Apply 1 drop to eye daily as needed (dry eyes).     . Cholecalciferol (VITAMIN D) 50 MCG (2000 UT) tablet Take 2,000 Units by mouth at bedtime.    Marland Kitchen ezetimibe (ZETIA) 10 MG tablet TAKE 1 TABLET(10 MG) BY MOUTH DAILY (Patient taking differently: Take 10 mg by mouth daily. ) 30 tablet 11  . famotidine (PEPCID) 20 MG tablet Take 1 tablet (20 mg total) by mouth at bedtime. 30 tablet 3  . Glucosamine-Chondroitin (OSTEO BI-FLEX REGULAR STRENGTH PO) Take by mouth daily.     . Methylcellulose, Laxative, 500 MG TABS Take 2 tablets by mouth daily.     Marland Kitchen  Multiple Vitamins-Minerals (ONE DAILY WOMENS 50 PLUS PO) Take 0.5 tablets by mouth See admin instructions. Mon, Wed Fri and Sat    . pantoprazole (PROTONIX) 40 MG tablet Take 1 tablet (40 mg total) by mouth 2 (two) times daily before a meal. 60 tablet 3  . polyethylene glycol (MIRALAX / GLYCOLAX) packet Take 17 g by mouth daily.    . rosuvastatin (CRESTOR) 5 MG tablet Take 1 tablet by mouth once weekly. (Patient taking differently: Take 5 mg by mouth every Sunday. Take 1 tablet by mouth once weekly.) 15 tablet 3  .  SYNTHROID 50 MCG tablet Take 1 tablet (50 mcg total) by mouth daily. 30 tablet 11  . valsartan (DIOVAN) 320 MG tablet Take 320 mg by mouth at bedtime.      No current facility-administered medications for this visit.    Past Medical History:  Diagnosis Date  . Arthritis   . Cancer ([place])    skin melanoma under left eye  . Deaf    LEFT EAR  . GERD (gastroesophageal reflux disease)   . Hyperlipidemia Deaf in Left ear  . Hypertension     Past Surgical History:  Procedure Laterality Date  . ABDOMINAL HYSTERECTOMY  2001   Partial  . COLONOSCOPY  01/21/2003   [id number] rectum/ Long capacious colon, moderate prep made the exam more challenging.  Marland Kitchen COLONOSCOPY  05/18/2009   Dr. Gala Romney: Normal rectum/narrow mouth left sided diverticula   . COLONOSCOPY N/A 05/14/2013   Dr. Gala Romney: hemorrhoids, colonic diverticula, redudant colon  . ESOPHAGOGASTRODUODENOSCOPY  01/21/2003   RMR:  Normal esophagus, stomach, D1 and D2  . ESOPHAGOGASTRODUODENOSCOPY N/A 03/24/2014   Dr. Gala Romney :Hiatal hernia; otherwise negative EGD  . TONSILLECTOMY    . TOTAL HIP ARTHROPLASTY Right 05/02/2019   Procedure: RIGHT TOTAL HIP ARTHROPLASTY ANTERIOR APPROACH;  Surgeon: Mcarthur Rossetti, MD;  Location: [facility];  Service: Orthopedics;  Laterality: Right;    Family History  Problem Relation Age of Onset  . Heart disease Mother 62  . Dementia Mother   . Kidney disease Father   . Thyroid nodules Father   . Colon cancer Neg Hx     Social History   Socioeconomic History  . Marital status: Married    Spouse name: Not on file  . Number of children: Not on file  . Years of education: Not on file  . Highest education level: Not on file  Occupational History  . 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 Smoking status: Never Smoker  . Smokeless tobacco: Never Used  . Tobacco comment: Never smoked  Substance and Sexual Activity  . Alcohol use: No  .  Drug use: No  . Sexual activity: Not on file  Other Topics Concern  . Not on file  Social History Narrative  . Not on file   Social Determinants of Health   Financial Resource Strain:   . Difficulty of Paying Living Expenses:   Food Insecurity:   . Worried About Charity fundraiser in the Last Year:   . Arboriculturist in the Last Year:   Transportation Needs:   . Film/video editor (Medical):   Marland Kitchen Lack of Transportation (Non-Medical):   Physical Activity:   . Days of Exercise per Week:   . Minutes of Exercise per Session:   Stress:   . Feeling of Stress :   Social Connections:   . Frequency of  Communication with Friends and Family:   . Frequency of Social Gatherings with Friends and Family:   . Attends Religious Services:   . Active Member of Clubs or Organizations:   . Attends Archivist Meetings:   Marland Kitchen Marital Status:   Intimate Partner Violence:   . Fear of Current or Ex-Partner:   . Emotionally Abused:   Marland Kitchen Physically Abused:   . Sexually Abused:       ROS:  General: Negative for anorexia, weight loss, fever, chills, fatigue, weakness. Eyes: Negative for vision changes.  ENT: Negative for hoarseness, difficulty swallowing , nasal congestion. CV: Negative for chest pain, angina, palpitations, dyspnea on exertion, peripheral edema.  Respiratory: Negative for dyspnea at rest, dyspnea on exertion, cough, sputum, wheezing.  GI: See history of present illness. GU:  Negative for dysuria, hematuria, urinary incontinence, urinary frequency, nocturnal urination.  MS: Negative for joint pain, low back pain.  Derm: Negative for rash or itching.  Neuro: Negative for weakness, abnormal sensation, seizure, frequent headaches, memory loss, confusion.  Psych: Negative for anxiety, depression, suicidal ideation, hallucinations.  Endo: Negative for unusual weight change.  Heme: Negative for bruising or bleeding. Allergy: Negative for rash or hives.     Observations/Objective:  Lab Results  Component Value Date   CREATININE 0.69 05/03/2019   BUN 14 05/03/2019   NA 140 05/03/2019   K 4.6 05/03/2019   CL 111 05/03/2019   CO2 24 05/03/2019    Lab Results  Component Value Date   WBC 14.0 (H) 05/03/2019   HGB 11.8 (L) 05/03/2019   HCT 37.2 05/03/2019   MCV 92.8 05/03/2019   PLT 300 05/03/2019   Pleasant well nourished well   Assessment and Plan: Pleasant 69 year old female presenting for follow-up of GERD and hiatal hernia with significant reflux noted on upper GI series.  Doing much better at this time.  Continues pantoprazole 40 mg twice daily before meals and Pepcid at night.  He has made some dietary modifications which is also helped, specifically not eating within 3 hours of lying down.  She was eating a bowl of chocolate ice cream every evening before bed.  We discussed goal of adequate control of her symptoms on the least amount of medication.  Initially would have her stop Pepcid at bedtime if tolerated.  We could utilize Pepcid more as needed rather than scheduled.  Ultimately would like to see her on once daily pantoprazole that she is aware that if she has breakthrough symptoms and needs the medication that she should continue it.  We discussed antireflux measures.  Handouts have been provided.  She will come back in 1 year for follow-up but knows to call sooner if needed.  Follow Up Instructions:    I discussed the assessment and treatment plan with the patient. The patient was provided an opportunity to ask questions and all were answered. The patient agreed with the plan and demonstrated an understanding of the instructions. AVS mailed to patient's home address.   The patient was advised to call back or seek an in-person evaluation if the symptoms worsen or if the condition fails to improve as anticipated.  I provided 15 minutes of virtual face-to-face time during this encounter.   Neil Crouch, PA-C

## 2019-06-03 ENCOUNTER — Telehealth: Payer: Self-pay | Admitting: Internal Medicine

## 2019-06-03 NOTE — Telephone Encounter (Signed)
Pt received a letter from her insurance regarding her medication and PA. She is going to bring it by the office during lunch for the nurse to look at it and see if we need it for her to get her medications.

## 2019-06-03 NOTE — Telephone Encounter (Signed)
Pt brought letter by.  Almyra Free called and PA has been approved until 2022.  Called and made pt aware.

## 2019-06-12 ENCOUNTER — Other Ambulatory Visit: Payer: Self-pay

## 2019-06-12 ENCOUNTER — Ambulatory Visit (INDEPENDENT_AMBULATORY_CARE_PROVIDER_SITE_OTHER): Payer: Federal, State, Local not specified - PPO | Admitting: Orthopaedic Surgery

## 2019-06-12 ENCOUNTER — Encounter: Payer: Self-pay | Admitting: Orthopaedic Surgery

## 2019-06-12 DIAGNOSIS — Z96641 Presence of right artificial hip joint: Secondary | ICD-10-CM

## 2019-06-12 NOTE — Progress Notes (Signed)
HPI: Eileen Green returns now 6 weeks status post right total hip arthroplasty.  She is overall doing well.  She does have some soreness lateral aspect of the right hip that wakes her up quite a bit at night.  She feels her range of motion and strength are improving.  Physical exam: Right hip fluid motion slightly limitations of external and internal rotation.  Surgical incisions healing well.  Calf supple nontender.  Ambulates without any assistive device.  Tenderness over the right trochanteric region.  Impression: Status post right total hip arthroplasty 05/02/2019  Plan: She shown IT band stretching exercises she will continue work on scar tissue mobilization.  Follow-up with [place] in 5 weeks to check her progress.  Questions encouraged and answered at length

## 2019-06-16 ENCOUNTER — Telehealth: Payer: Self-pay | Admitting: Internal Medicine

## 2019-06-16 NOTE — Telephone Encounter (Signed)
Pt would like to speak with the nurse about her medication. [phone]

## 2019-06-16 NOTE — Telephone Encounter (Signed)
Spoke with pt. Pt has a hx of Gerd and a hiatal hernia. Pt is concerned with the changes noticed 1-2 weeks ago. Pt takes Pantoprazole 40 mg bid, Pepcid qpm and a Digestive Advantage pre/probiotic daily. Pt has been eating the same breakfast with special K cereal that contains chocolate in it. Pt feels full, has moderate pain that comes and goes where her lower sternum area is and pt feels it's where the hiatal hernia is.  Pt reports no nausea or vomiting but feels a heaviness in her abdomen when she eats. Pt mentioned she has stopped eating some of the things containing chocolate but has eaten the same cereal that contains chocolate for years. Pt would like to know if there is another test that can be run on her.

## 2019-06-17 ENCOUNTER — Telehealth: Payer: Self-pay | Admitting: Emergency Medicine

## 2019-06-17 NOTE — Telephone Encounter (Signed)
Tried to call patient. LMOAM for return call.

## 2019-06-17 NOTE — Telephone Encounter (Signed)
Noted  

## 2019-06-17 NOTE — Telephone Encounter (Signed)
Pt called stated she is retuning  lsl call

## 2019-06-19 NOTE — Telephone Encounter (Signed)
Tried to call pt. Mailbox has not been set up.

## 2019-06-19 NOTE — Telephone Encounter (Signed)
Spoke with pt. Pt would like to talk with LSL but will be traveling back on Monday and wont be able to talk. Pt says she feels that she is getting full very quickly. She couldn't finish a sandwich that she started. Pt takes Pepto Bismol as needed along with the Pantoprazole 40 mg bid that she takes. Pt feels that if the Pantoprazole isn't working well, then she has injured her esophagus per pt. Pt may need to be placed on a cancellation list. No close apts.

## 2019-06-19 NOTE — Telephone Encounter (Signed)
Eileen Green, can you find out when is good for the patient, needs to be at the beginning of my day or 60 minutes after my last patient of the day. Whichever day next week that works best for her.

## 2019-06-19 NOTE — Telephone Encounter (Signed)
Eileen Green,   I have tried to call pt a couple of times to determine next step. At time of video visit last month she was doing well. I am off tomorrow, but we have two options. We can schedule her for an office visit or if you can find a set time on Monday that she is available, I can call her over the phone. Best time for me would be 8am or 12:30pm.

## 2019-06-19 NOTE — Telephone Encounter (Signed)
Spoke with pt. She can talk anytime on Tuesday 06/24/19. She is aware that you see pts half day on Tuesday. Pt says she will wait for your call before 8:00 AM or at an hour after you see pts.

## 2019-06-24 NOTE — Telephone Encounter (Signed)
Spoke with patient. Since her virtual visit four weeks ago she has been having a lot of issues. Denies typical heartburn. But noted out of no where she is not having problems after meals. Especially worse after breakfast. Eats the same thing she has eaten chronically, mixture of two cereals.Feels like stomach will not hold even a small amount. Gets full quickly. Doesn't burn but feels like someone punch her in substernal area and just feels sore. Can be very uncomfortable. At times walking around or standing up will help. No n/v. Has lost 1-2 pounds. Taking pantoprazole bid and pepcid bid because of significant reflux and moderate sliding hiatal hernia seen earlier this year on barium study. No longer having nocturnal symptoms and often does fine after evening meal. Mostly problems with breakfast and lunch.   Monday, Ate breakfast 8:30. 10 am felt great. 11:30 started hurting. Felt like someone punch her in the sternum and has deep bruise. By 1:15 terrible pain. No vomiting. Ate lunch 1:30pm and shortly after felt fine. Supper never bothers her. Having to cut back on the amount she eats/drinks.    Suspect symptoms due to refractory reflux and moderate sized sliding hiatal hernia. Cannot rule out cameron lesions/ulcers.   Please arrange for EGD with RMR asap. Conscious sedation. ASA II.

## 2019-06-24 NOTE — Telephone Encounter (Signed)
Tried to call home and mobile #, no answer, left message for return call at both numbers.

## 2019-06-24 NOTE — Telephone Encounter (Signed)
Pt called office, EGD w/RMR scheduled for 06/27/19 at 7:30am. COVID test 06/25/19 at 8:45am. Instructions given on phone, verbalized understanding. Orders entered. Appt letter and procedure instructions sent via MyChart.

## 2019-06-25 ENCOUNTER — Other Ambulatory Visit: Payer: Self-pay

## 2019-06-25 ENCOUNTER — Other Ambulatory Visit (HOSPITAL_COMMUNITY)
Admission: RE | Admit: 2019-06-25 | Discharge: 2019-06-25 | Disposition: A | Discharge status: Home / Self Care | Payer: Federal, State, Local not specified - PPO | Source: Ambulatory Visit | Attending: Internal Medicine | Admitting: Internal Medicine

## 2019-06-25 DIAGNOSIS — Z20822 Contact with and (suspected) exposure to covid-19: Secondary | ICD-10-CM | POA: Insufficient documentation

## 2019-06-25 DIAGNOSIS — Z01812 Encounter for preprocedural laboratory examination: Secondary | ICD-10-CM | POA: Insufficient documentation

## 2019-06-25 LAB — SARS CORONAVIRUS 2 (TAT 6-24 HRS): SARS Coronavirus 2: NEGATIVE

## 2019-06-27 ENCOUNTER — Telehealth: Payer: Self-pay

## 2019-06-27 ENCOUNTER — Encounter (HOSPITAL_COMMUNITY)
Admission: RE | Disposition: A | Discharge status: Home / Self Care | Payer: Self-pay | Source: Home / Self Care | Attending: Internal Medicine

## 2019-06-27 ENCOUNTER — Encounter (HOSPITAL_COMMUNITY): Payer: Self-pay | Admitting: Internal Medicine

## 2019-06-27 ENCOUNTER — Ambulatory Visit (HOSPITAL_COMMUNITY)
Admission: RE | Admit: 2019-06-27 | Discharge: 2019-06-27 | Disposition: A | Discharge status: Home / Self Care | Payer: Federal, State, Local not specified - PPO | Attending: Internal Medicine | Admitting: Internal Medicine

## 2019-06-27 DIAGNOSIS — M1990 Unspecified osteoarthritis, unspecified site: Secondary | ICD-10-CM | POA: Insufficient documentation

## 2019-06-27 DIAGNOSIS — Z8249 Family history of ischemic heart disease and other diseases of the circulatory system: Secondary | ICD-10-CM | POA: Diagnosis not present

## 2019-06-27 DIAGNOSIS — H9192 Unspecified hearing loss, left ear: Secondary | ICD-10-CM | POA: Insufficient documentation

## 2019-06-27 DIAGNOSIS — K219 Gastro-esophageal reflux disease without esophagitis: Secondary | ICD-10-CM | POA: Insufficient documentation

## 2019-06-27 DIAGNOSIS — I10 Essential (primary) hypertension: Secondary | ICD-10-CM | POA: Insufficient documentation

## 2019-06-27 DIAGNOSIS — Z8719 Personal history of other diseases of the digestive system: Secondary | ICD-10-CM | POA: Insufficient documentation

## 2019-06-27 DIAGNOSIS — K449 Diaphragmatic hernia without obstruction or gangrene: Secondary | ICD-10-CM | POA: Diagnosis not present

## 2019-06-27 DIAGNOSIS — Z79899 Other long term (current) drug therapy: Secondary | ICD-10-CM | POA: Diagnosis not present

## 2019-06-27 DIAGNOSIS — R6881 Early satiety: Secondary | ICD-10-CM

## 2019-06-27 DIAGNOSIS — Z85820 Personal history of malignant melanoma of skin: Secondary | ICD-10-CM | POA: Insufficient documentation

## 2019-06-27 DIAGNOSIS — R1013 Epigastric pain: Secondary | ICD-10-CM

## 2019-06-27 DIAGNOSIS — Z79890 Hormone replacement therapy (postmenopausal): Secondary | ICD-10-CM | POA: Diagnosis not present

## 2019-06-27 DIAGNOSIS — E039 Hypothyroidism, unspecified: Secondary | ICD-10-CM | POA: Diagnosis not present

## 2019-06-27 DIAGNOSIS — E785 Hyperlipidemia, unspecified: Secondary | ICD-10-CM | POA: Insufficient documentation

## 2019-06-27 HISTORY — DX: Hypothyroidism, unspecified: E03.9

## 2019-06-27 HISTORY — PX: ESOPHAGOGASTRODUODENOSCOPY: SHX5428

## 2019-06-27 SURGERY — EGD (ESOPHAGOGASTRODUODENOSCOPY)
Anesthesia: Moderate Sedation

## 2019-06-27 MED ORDER — STERILE WATER FOR IRRIGATION IR SOLN
Status: DC | PRN
  Administered 2019-06-27: 1.5 mL

## 2019-06-27 MED ORDER — MIDAZOLAM HCL 5 MG/5ML IJ SOLN
INTRAMUSCULAR | Status: DC | PRN
  Administered 2019-06-27: 2 mg via INTRAVENOUS
  Administered 2019-06-27: 1 mg via INTRAVENOUS
  Administered 2019-06-27: 2 mg via INTRAVENOUS

## 2019-06-27 MED ORDER — MEPERIDINE HCL 100 MG/ML IJ SOLN
INTRAMUSCULAR | Status: DC | PRN
  Administered 2019-06-27: 15 mg via INTRAVENOUS
  Administered 2019-06-27: 10 mg via INTRAVENOUS
  Administered 2019-06-27: 25 mg via INTRAVENOUS

## 2019-06-27 MED ORDER — ONDANSETRON HCL 4 MG/2ML IJ SOLN
INTRAMUSCULAR | Status: DC | PRN
  Administered 2019-06-27: 4 mg via INTRAVENOUS

## 2019-06-27 MED ORDER — MEPERIDINE HCL 50 MG/ML IJ SOLN
INTRAMUSCULAR | Status: AC
  Filled 2019-06-27: qty 1

## 2019-06-27 MED ORDER — ONDANSETRON HCL 4 MG/2ML IJ SOLN
INTRAMUSCULAR | Status: AC
  Filled 2019-06-27: qty 2

## 2019-06-27 MED ORDER — LIDOCAINE VISCOUS HCL 2 % MT SOLN
OROMUCOSAL | Status: AC
  Filled 2019-06-27: qty 15

## 2019-06-27 MED ORDER — SODIUM CHLORIDE 0.9 % IV SOLN
INTRAVENOUS | Status: DC
  Administered 2019-06-27: 1000 mL via INTRAVENOUS

## 2019-06-27 MED ORDER — MIDAZOLAM HCL 5 MG/5ML IJ SOLN
INTRAMUSCULAR | Status: AC
  Filled 2019-06-27: qty 10

## 2019-06-27 NOTE — Discharge Instructions (Signed)
EGD Discharge instructions Please read the instructions outlined below and refer to this sheet in the next few weeks. These discharge instructions provide you with general information on caring for yourself after you leave the hospital. Your doctor may also give you specific instructions. While your treatment has been planned according to the most current medical practices available, unavoidable complications occasionally occur. If you have any problems or questions after discharge, please call your doctor. ACTIVITY  You may resume your regular activity but move at a slower pace for the next 24 hours.   Take frequent rest periods for the next 24 hours.   Walking will help expel (get rid of) the air and reduce the bloated feeling in your abdomen.   No driving for 24 hours (because of the anesthesia (medicine) used during the test).   You may shower.   Do not sign any important legal documents or operate any machinery for 24 hours (because of the anesthesia used during the test).  NUTRITION  Drink plenty of fluids.   You may resume your normal diet.   Begin with a light meal and progress to your normal diet.   Avoid alcoholic beverages for 24 hours or as instructed by your caregiver.  MEDICATIONS  You may resume your normal medications unless your caregiver tells you otherwise.  WHAT YOU CAN EXPECT TODAY  You may experience abdominal discomfort such as a feeling of fullness or "gas" pains.  FOLLOW-UP  Your doctor will discuss the results of your test with you.  SEEK IMMEDIATE MEDICAL ATTENTION IF ANY OF THE FOLLOWING OCCUR:  Excessive nausea (feeling sick to your stomach) and/or vomiting.   Severe abdominal pain and distention (swelling).   Trouble swallowing.   Temperature over 101 F (37.8 C).   Rectal bleeding or vomiting of blood.   GERD information provided  You have a small hiatal hernia but nothing else found on upper endoscopy.  Continue Protonix 40 mg twice  daily-that is all that is needed for your hiatal hernia  I am concerned that some of your symptoms may be due to underlying gallbladder disease  At patient request, I spoke to Lennox Pippins at [phone] and reviewed findings and recommendations  Lets order a gallbladder ultrasound to further evaluate postprandial epigastric pain.  Further recommendations to follow  PATIENT INSTRUCTIONS POST-ANESTHESIA  IMMEDIATELY FOLLOWING SURGERY:  Do not drive or operate machinery for the first twenty four hours after surgery.  Do not make any important decisions for twenty four hours after surgery or while taking narcotic pain medications or sedatives.  If you develop intractable nausea and vomiting or a severe headache please notify your doctor immediately.  FOLLOW-UP:  Please make an appointment with your surgeon as instructed. You do not need to follow up with anesthesia unless specifically instructed to do so.  WOUND CARE INSTRUCTIONS (if applicable):  Keep a dry clean dressing on the anesthesia/puncture wound site if there is drainage.  Once the wound has quit draining you may leave it open to air.  Generally you should leave the bandage intact for twenty four hours unless there is drainage.  If the epidural site drains for more than 36-48 hours please call the anesthesia department.  QUESTIONS?:  Please feel free to call your physician or the hospital operator if you have any questions, and they will be happy to assist you.      Gastroesophageal Reflux Disease, Adult Gastroesophageal reflux (GER) happens when acid from the stomach flows up into the tube  that connects the mouth and the stomach (esophagus). Normally, food travels down the esophagus and stays in the stomach to be digested. With GER, food and stomach acid sometimes move back up into the esophagus. You may have a disease called gastroesophageal reflux disease (GERD) if the reflux:  Happens often.  Causes frequent or very bad  symptoms.  Causes problems such as damage to the esophagus. When this happens, the esophagus becomes sore and swollen (inflamed). Over time, GERD can make small holes (ulcers) in the lining of the esophagus. What are the causes? This condition is caused by a problem with the muscle between the esophagus and the stomach. When this muscle is weak or not normal, it does not close properly to keep food and acid from coming back up from the stomach. The muscle can be weak because of:  Tobacco use.  Pregnancy.  Having a certain type of hernia (hiatal hernia).  Alcohol use.  Certain foods and drinks, such as coffee, chocolate, onions, and peppermint. What increases the risk? You are more likely to develop this condition if you:  Are overweight.  Have a disease that affects your connective tissue.  Use NSAID medicines. What are the signs or symptoms? Symptoms of this condition include:  Heartburn.  Difficult or painful swallowing.  The feeling of having a lump in the throat.  A bitter taste in the mouth.  Bad breath.  Having a lot of saliva.  Having an upset or bloated stomach.  Belching.  Chest pain. Different conditions can cause chest pain. Make sure you see your doctor if you have chest pain.  Shortness of breath or noisy breathing (wheezing).  Ongoing (chronic) cough or a cough at night.  Wearing away of the surface of teeth (tooth enamel).  Weight loss. How is this treated? Treatment will depend on how bad your symptoms are. Your doctor may suggest:  Changes to your diet.  Medicine.  Surgery. Follow these instructions at home: Eating and drinking   Follow a diet as told by your doctor. You may need to avoid foods and drinks such as: ? Coffee and tea (with or without caffeine). ? Drinks that contain alcohol. ? Energy drinks and sports drinks. ? Bubbly (carbonated) drinks or sodas. ? Chocolate and cocoa. ? Peppermint and mint flavorings. ? Garlic and  onions. ? Horseradish. ? Spicy and acidic foods. These include peppers, chili powder, curry powder, vinegar, hot sauces, and BBQ sauce. ? Citrus fruit juices and citrus fruits, such as oranges, lemons, and limes. ? Tomato-based foods. These include red sauce, chili, salsa, and pizza with red sauce. ? Fried and fatty foods. These include donuts, french fries, potato chips, and high-fat dressings. ? High-fat meats. These include hot dogs, rib eye steak, sausage, ham, and bacon. ? High-fat dairy items, such as whole milk, butter, and cream cheese.  Eat small meals often. Avoid eating large meals.  Avoid drinking large amounts of liquid with your meals.  Avoid eating meals during the 2-3 hours before bedtime.  Avoid lying down right after you eat.  Do not exercise right after you eat. Lifestyle   Do not use any products that contain nicotine or tobacco. These include cigarettes, e-cigarettes, and chewing tobacco. If you need help quitting, ask your doctor.  Try to lower your stress. If you need help doing this, ask your doctor.  If you are overweight, lose an amount of weight that is healthy for you. Ask your doctor about a safe weight loss goal. General  instructions  Pay attention to any changes in your symptoms.  Take over-the-counter and prescription medicines only as told by your doctor. Do not take aspirin, ibuprofen, or other NSAIDs unless your doctor says it is okay.  Wear loose clothes. Do not wear anything tight around your waist.  Raise (elevate) the head of your bed about 6 inches (15 cm).  Avoid bending over if this makes your symptoms worse.  Keep all follow-up visits as told by your doctor. This is important. Contact a doctor if:  You have new symptoms.  You lose weight and you do not know why.  You have trouble swallowing or it hurts to swallow.  You have wheezing or a cough that keeps happening.  Your symptoms do not get better with treatment.  You have  a hoarse voice. Get help right away if:  You have pain in your arms, neck, jaw, teeth, or back.  You feel sweaty, dizzy, or light-headed.  You have chest pain or shortness of breath.  You throw up (vomit) and your throw-up looks like blood or coffee grounds.  You pass out (faint).  Your poop (stool) is bloody or black.  You cannot swallow, drink, or eat. Summary  If a person has gastroesophageal reflux disease (GERD), food and stomach acid move back up into the esophagus and cause symptoms or problems such as damage to the esophagus.  Treatment will depend on how bad your symptoms are.  Follow a diet as told by your doctor.  Take all medicines only as told by your doctor. This information is not intended to replace advice given to you by your health care provider. Make sure you discuss any questions you have with your health care provider. Document Revised: 06/27/2017 Document Reviewed: 06/27/2017 Elsevier Patient Education  [place].   Hiatal Hernia  A hiatal hernia occurs when part of the stomach slides above the muscle that separates the abdomen from the chest (diaphragm). A person can be born with a hiatal hernia (congenital), or it may develop over time. In almost all cases of hiatal hernia, only the top part of the stomach pushes through the diaphragm. Many people have a hiatal hernia with no symptoms. The larger the hernia, the more likely it is that you will have symptoms. In some cases, a hiatal hernia allows stomach acid to flow back into the tube that carries food from your mouth to your stomach (esophagus). This may cause heartburn symptoms. Severe heartburn symptoms may mean that you have developed a condition called gastroesophageal reflux disease (GERD). What are the causes? This condition is caused by a weakness in the opening (hiatus) where the esophagus passes through the diaphragm to attach to the upper part of the stomach. A person may be born with a  weakness in the hiatus, or a weakness can develop over time. What increases the risk? This condition is more likely to develop in:  Older people. Age is a major risk factor for a hiatal hernia, especially if you are over the age of 41.  Pregnant women.  People who are overweight.  People who have frequent constipation. What are the signs or symptoms? Symptoms of this condition usually develop in the form of GERD symptoms. Symptoms include:  Heartburn.  Belching.  Indigestion.  Trouble swallowing.  Coughing or wheezing.  Sore throat.  Hoarseness.  Chest pain.  Nausea and vomiting. How is this diagnosed? This condition may be diagnosed during testing for GERD. Tests that may be done include:  X-rays of your stomach or chest.  An upper gastrointestinal (GI) series. This is an X-ray exam of your GI tract that is taken after you swallow a chalky liquid that shows up clearly on the X-ray.  Endoscopy. This is a procedure to look into your stomach using a thin, flexible tube that has a tiny camera and light on the end of it. How is this treated? This condition may be treated by:  Dietary and lifestyle changes to help reduce GERD symptoms.  Medicines. These may include: ? Over-the-counter antacids. ? Medicines that make your stomach empty more quickly. ? Medicines that block the production of stomach acid (H2 blockers). ? Stronger medicines to reduce stomach acid (proton pump inhibitors).  Surgery to repair the hernia, if other treatments are not helping. If you have no symptoms, you may not need treatment. Follow these instructions at home: Lifestyle and activity  Do not use any products that contain nicotine or tobacco, such as cigarettes and e-cigarettes. If you need help quitting, ask your health care provider.  Try to achieve and maintain a healthy body weight.  Avoid putting pressure on your abdomen. Anything that puts pressure on your abdomen increases the  amount of acid that may be pushed up into your esophagus. ? Avoid bending over, especially after eating. ? Raise the head of your bed by putting blocks under the legs. This keeps your head and esophagus higher than your stomach. ? Do not wear tight clothing around your chest or stomach. ? Try not to strain when having a bowel movement, when urinating, or when lifting heavy objects. Eating and drinking  Avoid foods that can worsen GERD symptoms. These may include: ? Fatty foods, like fried foods. ? Citrus fruits, like oranges or lemon. ? Other foods and drinks that contain acid, like orange juice or tomatoes. ? Spicy food. ? Chocolate.  Eat frequent small meals instead of three large meals a day. This helps prevent your stomach from getting too full. ? Eat slowly. ? Do not lie down right after eating. ? Do not eat 1-2 hours before bed.  Do not drink beverages with caffeine. These include cola, coffee, cocoa, and tea.  Do not drink alcohol. General instructions  Take over-the-counter and prescription medicines only as told by your health care provider.  Keep all follow-up visits as told by your health care provider. This is important. Contact a health care provider if:  Your symptoms are not controlled with medicines or lifestyle changes.  You are having trouble swallowing.  You have coughing or wheezing that will not go away. Get help right away if:  Your pain is getting worse.  Your pain spreads to your arms, neck, jaw, teeth, or back.  You have shortness of breath.  You sweat for no reason.  You feel sick to your stomach (nauseous) or you vomit.  You vomit blood.  You have bright red blood in your stools.  You have black, tarry stools. This information is not intended to replace advice given to you by your health care provider. Make sure you discuss any questions you have with your health care provider. Document Revised: 12/01/2016 Document Reviewed:  07/24/2016 Elsevier Patient Education  [place].

## 2019-06-27 NOTE — Telephone Encounter (Signed)
Angie at [facility] endo called office and LMOVM. RMR wants pt to have gallbladder US to evaluate postprandial epigastric pain.

## 2019-06-27 NOTE — H&P (Signed)
@LOGO @   Primary Care Physician:  Asencion Noble, MD Primary Gastroenterologist:  Dr. Gala Romney  Pre-Procedure History & Physical: HPI:  Eileen Green is a 69 y.o. female here for further evaluation of intermittent lower retroxiphoid upper abdominal discomfort which primarily occurs about an hour postprandial last for about 3 hours.  Some early satiety type symptoms.  Occasionally she feels it taking in food helps symptoms.  Gallbladder in situ.  No NSAIDs.  No odynophagia or dysphagia.  Typical reflux symptoms controlled on Protonix twice daily with famotidine as needed on top.  Past Medical History:  Diagnosis Date  . Arthritis   . Cancer ([place])    skin melanoma under left eye  . Deaf    LEFT EAR  . GERD (gastroesophageal reflux disease)   . Hyperlipidemia Deaf in Left ear  . Hypertension   . Hypothyroidism     Past Surgical History:  Procedure Laterality Date  . ABDOMINAL HYSTERECTOMY  2001   Partial  . COLONOSCOPY  01/21/2003   [id number] rectum/ Long capacious colon, moderate prep made the exam more challenging.  Marland Kitchen COLONOSCOPY  05/18/2009   Dr. Gala Romney: Normal rectum/narrow mouth left sided diverticula   . COLONOSCOPY N/A 05/14/2013   Dr. Gala Romney: hemorrhoids, colonic diverticula, redudant colon  . ESOPHAGOGASTRODUODENOSCOPY  01/21/2003   RMR:  Normal esophagus, stomach, D1 and D2  . ESOPHAGOGASTRODUODENOSCOPY N/A 03/24/2014   Dr. Gala Romney :Hiatal hernia; otherwise negative EGD  . TONSILLECTOMY    . TOTAL HIP ARTHROPLASTY Right 05/02/2019   Procedure: RIGHT TOTAL HIP ARTHROPLASTY ANTERIOR APPROACH;  Surgeon: Mcarthur Rossetti, MD;  Location: [facility] ORS;  Service: Orthopedics;  Laterality: Right;    Prior to Admission medications   Medication Sig Start Date End Date Taking? Authorizing Provider  Bacillus Coagulans-Inulin (PROBIOTIC-PREBIOTIC PO) Take 1 capsule by mouth daily at 12 noon. Digestive Advantage    Yes [provider]  Carboxymethylcellulose Sodium (THERATEARS) 0.25  % SOLN Apply 1 drop to eye daily as needed (dry eyes).    Yes [provider]  Cholecalciferol (VITAMIN D) 50 MCG (2000 UT) tablet Take 2,000 Units by mouth at bedtime. Take Sunday, Tuesday Thursday All the other days take 1000 mg   Yes [provider]  ezetimibe (ZETIA) 10 MG tablet TAKE 1 TABLET(10 MG) BY MOUTH DAILY Patient taking differently: Take 10 mg by mouth daily.  12/12/18  Yes Hilty, Nadean Corwin, MD  famotidine (PEPCID) 20 MG tablet Take 1 tablet (20 mg total) by mouth at bedtime. 04/11/19  Yes Mahala Menghini, PA-C  Glucosamine-Chondroitin (OSTEO BI-FLEX REGULAR STRENGTH PO) Take 1 tablet by mouth daily.    Yes [provider]  Methylcellulose, Laxative, 500 MG TABS Take 1,000 mg by mouth daily.    Yes [provider]  Multiple Vitamins-Minerals (ONE DAILY WOMENS 50 PLUS PO) Take 0.5 tablets by mouth See admin instructions. Mon, Wed Fri and Sat  Centrum Silver 50+   Yes [provider]  pantoprazole (PROTONIX) 40 MG tablet Take 1 tablet (40 mg total) by mouth 2 (two) times daily before a meal. 04/07/19  Yes Annitta Needs, NP  polyethylene glycol (MIRALAX / GLYCOLAX) packet Take 17 g by mouth daily.   Yes [provider]  rosuvastatin (CRESTOR) 5 MG tablet Take 1 tablet by mouth once weekly. Patient taking differently: Take 5 mg by mouth once a week. Sunday 05/23/18  Yes Hilty, Nadean Corwin, MD  SYNTHROID 50 MCG tablet Take 1 tablet (50 mcg total) by mouth daily. Patient  taking differently: Take 50 mcg by mouth daily before breakfast.  06/04/13  Yes Armandina Gemma, MD  valsartan (DIOVAN) 320 MG tablet Take 320 mg by mouth at bedtime.  09/08/18  Yes [provider]    Allergies as of 06/24/2019  . (No Known Allergies)    Family History  Problem Relation Age of Onset  . Heart disease Mother 73  . Dementia Mother   . Kidney disease Father   . Thyroid nodules Father   . Colon cancer Neg Hx     Social History   Socioeconomic  History  . Marital status: Married    Spouse name: Not on file  . Number of children: Not on file  . Years of education: Not on file  . Highest education level: Not on file  Occupational History  . 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 Smoking status: Never Smoker  . Smokeless tobacco: Never Used  . Tobacco comment: Never smoked  Vaping Use  . Vaping Use: Never used  Substance and Sexual Activity  . Alcohol use: No  . Drug use: No  . Sexual activity: Not on file  Other Topics Concern  . Not on file  Social History Narrative  . Not on file   Social Determinants of Health   Financial Resource Strain:   . Difficulty of Paying Living Expenses:   Food Insecurity:   . Worried About Charity fundraiser in the Last Year:   . Arboriculturist in the Last Year:   Transportation Needs:   . Film/video editor (Medical):   Marland Kitchen Lack of Transportation (Non-Medical):   Physical Activity:   . Days of Exercise per Week:   . Minutes of Exercise per Session:   Stress:   . Feeling of Stress :   Social Connections:   . Frequency of Communication with Friends and Family:   . Frequency of Social Gatherings with Friends and Family:   . Attends Religious Services:   . Active Member of Clubs or Organizations:   . Attends Archivist Meetings:   Marland Kitchen Marital Status:   Intimate Partner Violence:   . Fear of Current or Ex-Partner:   . Emotionally Abused:   Marland Kitchen Physically Abused:   . Sexually Abused:     Review of Systems: See HPI, otherwise negative ROS  Physical Exam: BP 130/80   Pulse 86   Temp 98.2 F (36.8 C)   Resp 15   Ht 5\' 5"  (1.651 m)   Wt 82.6 kg   SpO2 95%   BMI 30.29 kg/m  General:   Alert,  Well-developed, well-nourished, pleasant and cooperative in NAD Neck:  Supple; no masses or thyromegaly. No significant cervical adenopathy. Lungs:  Clear throughout to auscultation.   No wheezes, crackles, or  rhonchi. No acute distress. Heart:  Regular rate and rhythm; no murmurs, clicks, rubs,  or gallops. Abdomen: Non-distended, normal bowel sounds.  Soft and nontender without appreciable mass or hepatosplenomegaly.  Pulses:  Normal pulses noted. Extremities:  Without clubbing or edema.  Impression/Plan: 69 year old lady with recent onset vague early satiety postprandial upper abdominal pain primarily.  Patient perceives some relief at times with ingestion of food.  Doubt acid peptic etiology here.  She has known small hiatal hernia.  Gallbladder is in situ.  Recommendations: I have offered the patient a diagnostic EGD today.  The risks, benefits, limitations, alternatives and imponderables have been  reviewed with the patient. Potential for esophageal dilation, biopsy, etc. have also been reviewed.  Questions have been answered. All parties agreeable.  I explained to patient she may need further evaluation if EGD is unrevealing as to the cause of her symptoms.     Notice: This dictation was prepared with Dragon dictation along with smaller phrase technology. Any transcriptional errors that result from this process are unintentional and may not be corrected upon review.

## 2019-06-27 NOTE — Telephone Encounter (Signed)
[place] abd RUQ scheduled for 07/02/19 at 8:30am, arrive at 8:15am. NPO 6 hours before test. Called and informed pt of [place] appt. Letter sent.

## 2019-06-27 NOTE — Op Note (Signed)
[facility] Patient Name: Eileen Green Procedure Date: 06/27/2019 7:29 AM MRN: [id number] Date of Birth: 02-25-1950 Attending MD: Norvel Richards , MD CSN: [id number] Age: 70 Admit Type: Outpatient Procedure:                Upper GI endoscopy Indications:              Epigastric abdominal pain, Early satiety Providers:                Norvel Richards, MD, Jeanann Lewandowsky. Sharon Seller, RN,                            Nelma Rothman, Technician Referring MD:              Medicines:                Midazolam 5 mg IV, Meperidine 50 mg IV Complications:            No immediate complications. Estimated Blood Loss:     Estimated blood loss: none. Procedure:                Pre-Anesthesia Assessment:                           - Prior to the procedure, a History and Physical                            was performed, and patient medications and                            allergies were reviewed. The patient's tolerance of                            previous anesthesia was also reviewed. The risks                            and benefits of the procedure and the sedation                            options and risks were discussed with the patient.                            All questions were answered, and informed consent                            was obtained. Prior Anticoagulants: The patient has                            taken no previous anticoagulant or antiplatelet                            agents. ASA Grade Assessment: II - A patient with                            mild systemic disease. After reviewing the risks  and benefits, the patient was deemed in                            satisfactory condition to undergo the procedure.                           After obtaining informed consent, the endoscope was                            passed under direct vision. Throughout the                            procedure, the patient's blood pressure, pulse, and                             oxygen saturations were monitored continuously. The                            [id number]-[id number] ([id number]) scope was introduced through the                            mouth, and advanced to the second part of duodenum.                            The upper GI endoscopy was accomplished without                            difficulty. The patient tolerated the procedure                            well. Scope In: 8:00:43 AM Scope Out: 8:04:50 AM Total Procedure Duration: 0 hours 4 minutes 7 seconds  Findings:      The examined esophagus was normal.      A small hiatal hernia was present.      The exam was otherwise without abnormality.      The duodenal bulb and second portion of the duodenum were normal. Impression:               - Normal esophagus.                           - Small hiatal hernia.                           - The stomach was otherwise normal.                           - Normal duodenal bulb and second portion of the                            duodenum.                           - No specimens collected. Her GERD symptoms well  controlled on twice daily Protonix. She has a                            postprandial component to her epigastric pain.                            Gallbladder remains in situ. Moderate Sedation:      Moderate (conscious) sedation was administered by the endoscopy nurse       and supervised by the endoscopist. The following parameters were       monitored: oxygen saturation, heart rate, blood pressure, respiratory       rate, EKG, adequacy of pulmonary ventilation, and response to care.       Total physician intraservice time was 15 minutes. Recommendation:           - Patient has a contact number available for                            emergencies. The signs and symptoms of potential                            delayed complications were discussed with the                            patient. Return to normal activities  tomorrow.                            Written discharge instructions were provided to the                            patient.                           - Resume previous diet. Continue Protonix twice                            daily.                           - Continue present medications- Return to GI clinic                            (date not yet determined). Gallbladder ultrasound.                            Further recommendations to follow. Procedure Code(s):        --- Professional ---                           [phone], Esophagogastroduodenoscopy, flexible,                            transoral; diagnostic, including collection of                            specimen(s) by brushing or washing, when performed                            (  separate procedure)                           G0500, Moderate sedation services provided by the                            same physician or other qualified health care                            professional performing a gastrointestinal                            endoscopic service that sedation supports,                            requiring the presence of an independent trained                            observer to assist in the monitoring of the                            patient's level of consciousness and physiological                            status; initial 15 minutes of intra-service time;                            patient age 39 years or older (additional time may                            be reported with [phone], as appropriate) Diagnosis Code(s):        --- Professional ---                           K44.9, Diaphragmatic hernia without obstruction or                            gangrene                           R10.13, Epigastric pain                           R68.81, Early satiety CPT copyright 2019 American Medical Association. All rights reserved. The codes documented in this report are preliminary and upon coder review may  be  revised to meet current compliance requirements. Cristopher Estimable. [name], MD Norvel Richards, MD 06/27/2019 8:30:28 AM This report has been signed electronically. Number of Addenda: 0

## 2019-07-02 ENCOUNTER — Other Ambulatory Visit: Payer: Self-pay

## 2019-07-02 ENCOUNTER — Encounter (HOSPITAL_COMMUNITY): Payer: Self-pay | Admitting: Internal Medicine

## 2019-07-02 ENCOUNTER — Ambulatory Visit (HOSPITAL_COMMUNITY)
Admission: RE | Admit: 2019-07-02 | Discharge: 2019-07-02 | Disposition: A | Discharge status: Home / Self Care | Payer: Federal, State, Local not specified - PPO | Source: Ambulatory Visit | Attending: Internal Medicine | Admitting: Internal Medicine

## 2019-07-02 DIAGNOSIS — R1013 Epigastric pain: Secondary | ICD-10-CM | POA: Diagnosis not present

## 2019-07-02 DIAGNOSIS — Z79899 Other long term (current) drug therapy: Secondary | ICD-10-CM

## 2019-07-03 ENCOUNTER — Other Ambulatory Visit: Payer: Self-pay

## 2019-07-03 DIAGNOSIS — K8020 Calculus of gallbladder without cholecystitis without obstruction: Secondary | ICD-10-CM

## 2019-07-04 LAB — HEPATIC FUNCTION PANEL
AG Ratio: 2 (calc) (ref 1.0–2.5)
ALT: 19 U/L (ref 6–29)
AST: 17 U/L (ref 10–35)
Albumin: 4.5 g/dL (ref 3.6–5.1)
Alkaline phosphatase (APISO): 117 U/L (ref 37–153)
Bilirubin, Direct: 0.1 mg/dL (ref 0.0–0.2)
Globulin: 2.2 g/dL (calc) (ref 1.9–3.7)
Indirect Bilirubin: 0.3 mg/dL (calc) (ref 0.2–1.2)
Total Bilirubin: 0.4 mg/dL (ref 0.2–1.2)
Total Protein: 6.7 g/dL (ref 6.1–8.1)

## 2019-07-08 ENCOUNTER — Telehealth: Payer: Self-pay | Admitting: Emergency Medicine

## 2019-07-08 NOTE — Telephone Encounter (Signed)
error 

## 2019-07-14 ENCOUNTER — Other Ambulatory Visit: Payer: Self-pay | Admitting: Internal Medicine

## 2019-07-14 ENCOUNTER — Other Ambulatory Visit: Payer: Self-pay

## 2019-07-14 MED ORDER — ROSUVASTATIN CALCIUM 5 MG PO TABS: 5.0000 mg | ORAL_TABLET | ORAL | 3 refills | Status: DC

## 2019-07-14 NOTE — Telephone Encounter (Signed)
*  STAT* If patient is at the pharmacy, call can be transferred to refill team.   1. Which medications need to be refilled? (please list name of each medication and dose if known)  rosuvastatin (CRESTOR) 5 MG tablet  2. Which pharmacy/location (including street and city if local pharmacy) is medication to be sent to? [facility] [place], [place] [address]  3. Do they need a 30 day or 90 day supply? 30 day supply    Patient is at pharmacy now.

## 2019-07-15 ENCOUNTER — Encounter: Payer: Self-pay | Admitting: General Surgery

## 2019-07-15 ENCOUNTER — Ambulatory Visit (INDEPENDENT_AMBULATORY_CARE_PROVIDER_SITE_OTHER): Payer: Federal, State, Local not specified - PPO | Admitting: General Surgery

## 2019-07-15 ENCOUNTER — Other Ambulatory Visit: Payer: Self-pay

## 2019-07-15 VITALS — BP 126/80 | HR 79 | Temp 97.4°F | Resp 14 | Ht 65.0 in | Wt 186.0 lb

## 2019-07-15 DIAGNOSIS — K8020 Calculus of gallbladder without cholecystitis without obstruction: Secondary | ICD-10-CM

## 2019-07-15 NOTE — Patient Instructions (Signed)
Biliary Colic, Adult  Biliary colic is severe pain caused by a problem with a small organ in the upper right part of your belly (gallbladder). The gallbladder stores a digestive fluid produced in the liver (bile) that helps the body break down fat. Bile and other digestive enzymes are carried from the liver to the small intestine through tube-like structures (bile ducts). The gallbladder and the bile ducts form the biliary tract. Sometimes hard deposits of digestive fluids form in the gallbladder (gallstones) and block the flow of bile from the gallbladder, causing biliary colic. This condition is also called a gallbladder attack. Gallstones can be as small as a grain of sand or as big as a golf ball. There could be just one gallstone in the gallbladder, or there could be many. What are the causes? Biliary colic is usually caused by gallstones. Less often, a tumor could block the flow of bile from the gallbladder and trigger biliary colic. What increases the risk? This condition is more likely to develop in:  Women.  People of Hispanic descent.  People with a family history of gallstones.  People who are obese.  People who suddenly or quickly lose weight.  People who eat a high-calorie, low-fiber diet that is rich in refined carbs (carbohydrates), such as white bread and white rice.  People who have an intestinal disease that affects nutrient absorption, such as Crohn disease.  People who have a metabolic condition, such as metabolic syndrome or diabetes. What are the signs or symptoms? Severe pain in the upper right side of the belly is the main symptom of biliary colic. You may feel this pain below the chest but above the hip. This pain often occurs at night or after eating a very fatty meal. This pain may get worse for up to an hour and last as long as 12 hours. In most cases, the pain fades (subsides) within a couple hours. Other symptoms of this condition include:  Nausea and  vomiting.  Pain under the right shoulder. How is this diagnosed? This condition is diagnosed based on your medical history, your symptoms, and a physical exam. You may have tests, including:  Blood tests to rule out infection or inflammation of the bile ducts, gallbladder, pancreas, or liver.  Imaging studies such as: ? Ultrasound. ? CT scan. ? MRI. In some cases, you may need to have an imaging study done using a small amount of radioactive material (nuclear medicine) to confirm the diagnosis. How is this treated? Treatment for this condition may include medicine to relieve your pain or nausea. If you have gallstones that are causing biliary colic, you may need surgery to remove the gallbladder (cholecystectomy). Gallstones can also be dissolved gradually with medicine. It may take months or years before the gallstones are completely gone. Follow these instructions at home:  Take over-the-counter and prescription medicines only as told by your health care provider.  Drink enough fluid to keep your urine clear or pale yellow.  Follow instructions from your health care provider about eating or drinking restrictions. These may include avoiding: ? Fatty, greasy, and fried foods. ? Any foods that make the pain worse. ? Overeating. ? Having a large meal after not eating for a while.  Keep all follow-up visits as told by your health care provider. This is important. How is this prevented? Steps to prevent this condition include:  Maintaining a healthy body weight.  Getting regular exercise.  Eating a healthy, high-fiber, low-fat diet.  Limiting how much   sugar and refined carbs you eat, such as sweets, white flour, and white rice. Contact a health care provider if:  Your pain lasts more than 5 hours.  You vomit.  You have a fever and chills.  Your pain gets worse. Get help right away if:  Your skin or the whites of your eyes look yellow (jaundice).  Your have tea-colored  urine and light-colored stools.  You are dizzy or you faint. Summary  Biliary colic is severe pain caused by a problem with a small organ in the upper right part of your belly (gallbladder).  Treatments for this condition include medicines that relieves your pain or nausea and medicines that slowly dissolves the gallstones.  If gallstones cause your biliary colic, the treatment is surgery to remove the gallbladder (cholecystectomy). This information is not intended to replace advice given to you by your health care provider. Make sure you discuss any questions you have with your health care provider. Document Revised: 12/01/2016 Document Reviewed: 07/05/2015 Elsevier Patient Education  [place]. Cholelithiasis  Cholelithiasis is a form of gallbladder disease in which gallstones form in the gallbladder. The gallbladder is an organ that stores bile. Bile is made in the liver, and it helps to digest fats. Gallstones begin as small crystals and slowly grow into stones. They may cause no symptoms until the gallbladder tightens (contracts) and a gallstone is blocking the duct (gallbladder attack), which can cause pain. Cholelithiasis is also referred to as gallstones. There are two main types of gallstones:  Cholesterol stones. These are made of hardened cholesterol and are usually yellow-green in color. They are the most common type of gallstone. Cholesterol is a white, waxy, fat-like substance that is made in the liver.  Pigment stones. These are dark in color and are made of a red-yellow substance that forms when hemoglobin from red blood cells breaks down (bilirubin). What are the causes? This condition may be caused by an imbalance in the substances that bile is made of. This can happen if the bile:  Has too much bilirubin.  Has too much cholesterol.  Does not have enough bile salts. These salts help the body absorb and digest fats. In some cases, this condition can also be  caused by the gallbladder not emptying completely or often enough. What increases the risk? The following factors may make you more likely to develop this condition:  Being female.  Having multiple pregnancies. Health care providers sometimes advise removing diseased gallbladders before future pregnancies.  Eating a diet that is heavy in fried foods, fat, and refined carbohydrates, like white bread and white rice.  Being obese.  Being older than age 51.  Prolonged use of medicines that contain female hormones (estrogen).  Having diabetes mellitus.  Rapidly losing weight.  Having a family history of gallstones.  Being of [place] or [place] descent.  Having an intestinal disease such as Crohn disease.  Having metabolic syndrome.  Having cirrhosis.  Having severe types of anemia such as sickle cell anemia. What are the signs or symptoms? In most cases, there are no symptoms. These are known as silent gallstones. If a gallstone blocks the bile ducts, it can cause a gallbladder attack. The main symptom of a gallbladder attack is sudden pain in the upper right abdomen. The pain usually comes at night or after eating a large meal. The pain can last for one or several hours and can spread to the right shoulder or chest. If the bile duct is blocked  for more than a few hours, it can cause infection or inflammation of the gallbladder, liver, or pancreas, which may cause:  Nausea.  Vomiting.  Abdominal pain that lasts for 5 hours or more.  Fever or chills.  Yellowing of the skin or the whites of the eyes (jaundice).  Dark urine.  Light-colored stools. How is this diagnosed? This condition may be diagnosed based on:  A physical exam.  Your medical history.  An ultrasound of your gallbladder.  CT scan.  MRI.  Blood tests to check for signs of infection or inflammation.  A scan of your gallbladder and bile ducts (biliary system) using nonharmful radioactive  material and special cameras that can see the radioactive material (cholescintigram). This test checks to see how your gallbladder contracts and whether bile ducts are blocked.  Inserting a small tube with a camera on the end (endoscope) through your mouth to inspect bile ducts and check for blockages (endoscopic retrograde cholangiopancreatogram). How is this treated? Treatment for gallstones depends on the severity of the condition. Silent gallstones do not need treatment. If the gallstones cause a gallbladder attack or other symptoms, treatment may be required. Options for treatment include:  Surgery to remove the gallbladder (cholecystectomy). This is the most common treatment.  Medicines to dissolve gallstones. These are most effective at treating small gallstones. You may need to take medicines for up to 6-12 months.  Shock wave treatment (extracorporeal biliary lithotripsy). In this treatment, an ultrasound machine sends shock waves to the gallbladder to break gallstones into smaller pieces. These pieces can then be passed into the intestines or be dissolved by medicine. This is rarely used.  Removing gallstones through endoscopic retrograde cholangiopancreatogram. A small basket can be attached to the endoscope and used to capture and remove gallstones. Follow these instructions at home:  Take over-the-counter and prescription medicines only as told by your health care provider.  Maintain a healthy weight and follow a healthy diet. This includes: ? Reducing fatty foods, such as fried food. ? Reducing refined carbohydrates, like white bread and white rice. ? Increasing fiber. Aim for foods like almonds, fruit, and beans.  Keep all follow-up visits as told by your health care provider. This is important. Contact a health care provider if:  You think you have had a gallbladder attack.  You have been diagnosed with silent gallstones and you develop abdominal pain or indigestion. Get  help right away if:  You have pain from a gallbladder attack that lasts for more than 2 hours.  You have abdominal pain that lasts for more than 5 hours.  You have a fever or chills.  You have persistent nausea and vomiting.  You develop jaundice.  You have dark urine or light-colored stools. Summary  Cholelithiasis (also called gallstones) is a form of gallbladder disease in which gallstones form in the gallbladder.  This condition is caused by an imbalance in the substances that make up bile. This can happen if the bile has too much cholesterol, too much bilirubin, or not enough bile salts.  You are more likely to develop this condition if you are female, pregnant, using medicines with estrogen, obese, older than age 73, or have a family history of gallstones. You may also develop gallstones if you have diabetes, an intestinal disease, cirrhosis, or metabolic syndrome.  Treatment for gallstones depends on the severity of the condition. Silent gallstones do not need treatment.  If gallstones cause a gallbladder attack or other symptoms, treatment may be needed. The  most common treatment is surgery to remove the gallbladder. This information is not intended to replace advice given to you by your health care provider. Make sure you discuss any questions you have with your health care provider. Document Revised: 12/01/2016 Document Reviewed: 09/05/2015 Elsevier Patient Education  2020 [employer].

## 2019-07-16 NOTE — Progress Notes (Signed)
Eileen Green; [id number]; May 31, 1950   HPI Patient is a 69 year old white female who was referred to my care by Dr. Asencion Noble and Dr. Gala Romney for evaluation treatment of cholelithiasis.  Patient has had a longstanding history of GERD with documented reflux up to her neck.  She has had persistent epigastric pain and the feeling of fullness in the epigastric region after eating.  She had an EGD which was remarkable only for sliding hiatal hernia.  She was noted on ultrasound of the gallbladder to have cholelithiasis.  She denies any right upper quadrant abdominal pain, fever, chills, jaundice, or fatty food intolerance.  She currently has 0 out of 10 abdominal pain. Past Medical History:  Diagnosis Date  . Arthritis   . Cancer ([place])    skin melanoma under left eye  . Deaf    LEFT EAR  . GERD (gastroesophageal reflux disease)   . Hyperlipidemia Deaf in Left ear  . Hypertension   . Hypothyroidism     Past Surgical History:  Procedure Laterality Date  . ABDOMINAL HYSTERECTOMY  2001   Partial  . COLONOSCOPY  01/21/2003   [facility]:NFAOZH rectum/ Long capacious colon, moderate prep made the exam more challenging.  Marland Kitchen COLONOSCOPY  05/18/2009   Dr. Gala Romney: Normal rectum/narrow mouth left sided diverticula   . COLONOSCOPY N/A 05/14/2013   Dr. Gala Romney: hemorrhoids, colonic diverticula, redudant colon  . ESOPHAGOGASTRODUODENOSCOPY  01/21/2003   [facility]:  Normal esophagus, stomach, D1 and D2  . ESOPHAGOGASTRODUODENOSCOPY N/A 03/24/2014   Dr. Gala Romney :Hiatal hernia; otherwise negative EGD  . ESOPHAGOGASTRODUODENOSCOPY N/A 06/27/2019   Procedure: ESOPHAGOGASTRODUODENOSCOPY (EGD);  Surgeon: Daneil Dolin, MD;  Location: [facility];  Service: Endoscopy;  Laterality: N/A;  7:30am  . TONSILLECTOMY    . TOTAL HIP ARTHROPLASTY Right 05/02/2019   Procedure: RIGHT TOTAL HIP ARTHROPLASTY ANTERIOR APPROACH;  Surgeon: Mcarthur Rossetti, MD;  Location: [facility];  Service: Orthopedics;  Laterality: Right;    Family  History  Problem Relation Age of Onset  . Heart disease Mother 13  . Dementia Mother   . Kidney disease Father   . Thyroid nodules Father   . Colon cancer Neg Hx     Current Outpatient Medications on File Prior to Visit  Medication Sig Dispense Refill  . Bacillus Coagulans-Inulin (PROBIOTIC-PREBIOTIC PO) Take 1 capsule by mouth daily at 12 noon. Digestive Advantage     . Carboxymethylcellulose Sodium (THERATEARS) 0.25 % SOLN Apply 1 drop to eye daily as needed (dry eyes).     . Cholecalciferol (VITAMIN D) 50 MCG (2000 UT) tablet Take 2,000 Units by mouth at bedtime. Take Sunday, Tuesday Thursday All the other days take 1000 mg    . ezetimibe (ZETIA) 10 MG tablet TAKE 1 TABLET(10 MG) BY MOUTH DAILY (Patient taking differently: Take 10 mg by mouth daily. ) 30 tablet 11  . famotidine (PEPCID) 20 MG tablet Take 1 tablet (20 mg total) by mouth at bedtime. 30 tablet 3  . Glucosamine-Chondroitin (OSTEO BI-FLEX REGULAR STRENGTH PO) Take 1 tablet by mouth daily.     . Methylcellulose, Laxative, 500 MG TABS Take 1,000 mg by mouth daily.     . Multiple Vitamins-Minerals (ONE DAILY WOMENS 50 PLUS PO) Take 0.5 tablets by mouth See admin instructions. Mon, Wed Fri and Sat  Centrum Silver 50+    . pantoprazole (PROTONIX) 40 MG tablet Take 1 tablet (40 mg total) by mouth 2 (two) times daily before a meal. 60 tablet 3  . polyethylene glycol (MIRALAX /  GLYCOLAX) packet Take 17 g by mouth daily.    . rosuvastatin (CRESTOR) 5 MG tablet Take 1 tablet (5 mg total) by mouth once a week. Sunday 12 tablet 3  . SYNTHROID 50 MCG tablet Take 1 tablet (50 mcg total) by mouth daily. (Patient taking differently: Take 50 mcg by mouth daily before breakfast. ) 30 tablet 11  . valsartan (DIOVAN) 320 MG tablet Take 320 mg by mouth at bedtime.      No current facility-administered medications on file prior to visit.    No Known Allergies  Social History   Substance and Sexual Activity  Alcohol Use No    Social  History   Tobacco Use  Smoking Status Never Smoker  Smokeless Tobacco Never Used  Tobacco Comment   Never smoked    Review of Systems  Constitutional: Negative.   HENT: Negative.   Eyes: Negative.   Respiratory: Negative.   Cardiovascular: Negative.   Gastrointestinal: Positive for abdominal pain.  Genitourinary: Negative.   Musculoskeletal: Positive for joint pain.  Skin: Negative.   Neurological: Negative.   Endo/Heme/Allergies: Negative.   Psychiatric/Behavioral: Negative.     Objective   Vitals:   07/15/19 1453  BP: 126/80  Pulse: 79  Resp: 14  Temp: (!) 97.4 F (36.3 C)  SpO2: 97%    Physical Exam Vitals reviewed.  Constitutional:      Appearance: Normal appearance. She is normal weight. She is not ill-appearing.  HENT:     Head: Normocephalic and atraumatic.  Eyes:     General: No scleral icterus. Cardiovascular:     Rate and Rhythm: Normal rate and regular rhythm.     Heart sounds: Normal heart sounds. No murmur heard.  No friction rub. No gallop.   Pulmonary:     Effort: Pulmonary effort is normal. No respiratory distress.     Breath sounds: Normal breath sounds. No stridor. No wheezing, rhonchi or rales.  Abdominal:     General: Bowel sounds are normal. There is no distension.     Palpations: Abdomen is soft. There is no mass.     Tenderness: There is no abdominal tenderness. There is no guarding or rebound.     Hernia: No hernia is present.  Skin:    General: Skin is warm and dry.  Neurological:     Mental Status: She is alert and oriented to person, place, and time.    GI notes reviewed Assessment  Cholelithiasis, atypical symptoms GERD Plan   We had a long discussion as to the benefits and risks of the cholecystectomy.  I did tell her that I could not guarantee that her symptoms would improve with cholecystectomy as she does not have any specific right upper quadrant abdominal pain.  The risks and benefits of the procedure including  bleeding, infection, hepatobiliary injury, and the possibility of an open procedure were fully explained to the patient.  She would like to think about surgery and will call me back with her decision.

## 2019-07-17 ENCOUNTER — Ambulatory Visit: Payer: Federal, State, Local not specified - PPO | Admitting: Orthopaedic Surgery

## 2019-07-17 ENCOUNTER — Ambulatory Visit (INDEPENDENT_AMBULATORY_CARE_PROVIDER_SITE_OTHER): Payer: Federal, State, Local not specified - PPO | Admitting: Orthopaedic Surgery

## 2019-07-17 ENCOUNTER — Other Ambulatory Visit: Payer: Self-pay

## 2019-07-17 ENCOUNTER — Encounter: Payer: Self-pay | Admitting: Orthopaedic Surgery

## 2019-07-17 DIAGNOSIS — Z96641 Presence of right artificial hip joint: Secondary | ICD-10-CM

## 2019-07-17 NOTE — Progress Notes (Signed)
The patient is now 10 weeks status post a right total hip arthroplasty.  She reports that she is doing very well.  There are some numbness around her incision.  She reports increased motion and strength.  On exam her right hip moves smoothly.  Her leg lengths appear equal.  She is walking without assistive device and looks good overall.  From my standpoint I do not need to see her back for 6 months.  At that visit I like a standing low AP pelvis and lateral right operative hip.  If there is any issues before then she will let us know.  All questions and concerns were answered and addressed.

## 2019-07-31 ENCOUNTER — Telehealth: Payer: Self-pay

## 2019-07-31 MED ORDER — HYDROCORTISONE (PERIANAL) 2.5 % EX CREA: 1.0000 "application " | TOPICAL_CREAM | Freq: Two times a day (BID) | CUTANEOUS | 0 refills | Status: DC

## 2019-07-31 NOTE — Telephone Encounter (Signed)
Spoke with pt. Pt had hip surgery 2 weeks ago and has noticed some external hemorrhoids. Pt has hx of internal hemorrhoids and says the external hemorrhoids are new. Pt feels they are the size of a blueberry and grape. Pt reports no pain, no bleeding. Pt has used preparation H Suppositories a few days and d/c due to the hemorrhoids  being external. Pt also used which hazel wipes a few days. Pt was asked has she strained or had any constipation since her surgery. Per pt, she hasn't had any constipation. Pts surgery has given her a mediation to take in the morning with Miralax in evening. Please advise

## 2019-07-31 NOTE — Telephone Encounter (Signed)
We can try hemorrhoid cream apply to hemorrhoids twice daily for two weeks. Avoid straining and hard stools. If persistent problems she will need OV.

## 2019-07-31 NOTE — Addendum Note (Signed)
Addended by: Mahala Menghini on: 07/31/2019 01:01 PM   Modules accepted: Orders

## 2019-07-31 NOTE — Telephone Encounter (Signed)
Spoke with pt. Pt was notified of that RX was sent to her pharmacy to use for 2 weeks. Pt is aware that if she continues to have problems, she will need to schedule an office visit.

## 2019-07-31 NOTE — Telephone Encounter (Signed)
VM received. Pt would like to discuss external hemorrhoids.

## 2019-08-03 ENCOUNTER — Other Ambulatory Visit: Payer: Self-pay | Admitting: Gastroenterology

## 2019-08-08 ENCOUNTER — Other Ambulatory Visit: Payer: Self-pay | Admitting: Gastroenterology

## 2019-08-08 DIAGNOSIS — K219 Gastro-esophageal reflux disease without esophagitis: Secondary | ICD-10-CM

## 2019-10-14 ENCOUNTER — Ambulatory Visit: Payer: Federal, State, Local not specified - PPO | Admitting: Internal Medicine

## 2019-10-14 ENCOUNTER — Encounter: Payer: Self-pay | Admitting: Internal Medicine

## 2019-10-14 ENCOUNTER — Other Ambulatory Visit: Payer: Self-pay

## 2019-10-14 VITALS — BP 134/82 | HR 69 | Ht 65.0 in | Wt 187.0 lb

## 2019-10-14 DIAGNOSIS — E785 Hyperlipidemia, unspecified: Secondary | ICD-10-CM | POA: Diagnosis not present

## 2019-10-14 DIAGNOSIS — I10 Essential (primary) hypertension: Secondary | ICD-10-CM | POA: Diagnosis not present

## 2019-10-14 DIAGNOSIS — Z8249 Family history of ischemic heart disease and other diseases of the circulatory system: Secondary | ICD-10-CM

## 2019-10-14 NOTE — Patient Instructions (Signed)

## 2019-10-14 NOTE — Progress Notes (Signed)
OFFICE NOTE  Chief Complaint:  Routine follow-up  Primary Care Physician: Asencion Noble, MD  HPI:  Eileen Green is a 69 y.o. female patient referred by Dr. Willey Blade for evaluation of chest pain. She reports she has an episode of chest pain about every month. It might be related to stress. 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 She does have a family history of heart disease with her mother who had an MI in her 23s and her father who had chronic kidney disease and was on dialysis. She denies any worsening shortness of breath or increased fatigue or decreased exercise tolerance. She does have additional risk factors including hypertension and dyslipidemia which are well controlled. Her recent LDL cholesterol was 108 however she is on a low-dose of a low potency statin. She does not have a history of any side effects on that.  02/08/2016  Mrs. Galanti underwent a stress echocardiogram which was negative for ischemia. She had good exercise tolerance despite having had an upper respiratory infection about a week prior to that. She says she was able to tolerate exercise without any significant shortness of breath which is a good sign. Oxygen saturation today however was only 95% and she says she's recently had some cough. Blood pressure is at goal. I felt like her chest pain is noncardiac and seem somewhat atypical. She does have cardio Gerald Stabs factors and a family history of premature coronary disease and I agree with aggressive primary prevention.   10/03/2017  Mrs. Wamble returns today for follow-up.  She was seen this past summer by Jory Sims, DNP, and has been asymptomatic.  She did report that she was having some cramping in her calves.  Was thought this might be due to her pravastatin.  The dose was decreased from 40 to 20 mg and she was placed on ezetimibe.  Repeat lipid profile testing was performed 11 days ago which demonstrated a nice improvement in her  lipid profile total cholesterol 148, triglycerides 92, HDL 46 and LDL 84.  She reports she still has some possible statin side effects.  10/03/2018  Mrs. Siebel returns today for follow-up.  Overall she says she is feeling fairly well.  She is having some issues with possible anxiety.  She is feeling jittery when she does some of her classes to [employer].  She had possible side effects and pravastatin and ultimately was changed over to rosuvastatin 5 mg once weekly.  She also takes ezetimibe.  Repeat labs were performed recently which showed total cholesterol 166, triglycerides 70, HDL 56 and LDL of 97.  This is her target LDL less than 100.  Blood pressures well controlled today 130/84.  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  10/14/2019  Ms. Rogacki returns today for follow-up.  She underwent right total hip replacement by Dr. Ninfa Linden and seems to be doing well with regards to that.  She denies any chest pain or shortness of breath.  EKG shows sinus rhythm with sinus arrhythmia.  Labs in August showed total cholesterol 182, HDL 50 and triglycerides 104.  PMHx:  Past Medical History:  Diagnosis Date  . Arthritis   . Cancer ([place])    skin melanoma under left eye  . Deaf    LEFT EAR  . GERD (gastroesophageal reflux disease)   . Hyperlipidemia Deaf in Left ear  . Hypertension   . Hypothyroidism  Past Surgical History:  Procedure Laterality Date  . ABDOMINAL HYSTERECTOMY  2001   Partial  . COLONOSCOPY  01/21/2003   [facility]:PIRJJO rectum/ Long capacious colon, moderate prep made the exam more challenging.  Marland Kitchen COLONOSCOPY  05/18/2009   Dr. Gala Romney: Normal rectum/narrow mouth left sided diverticula   . COLONOSCOPY N/A 05/14/2013   Dr. Gala Romney: hemorrhoids, colonic diverticula, redudant colon  . ESOPHAGOGASTRODUODENOSCOPY  01/21/2003   RMR:  Normal esophagus, stomach, D1 and D2  . ESOPHAGOGASTRODUODENOSCOPY N/A 03/24/2014   Dr. Gala Romney :Hiatal hernia;  otherwise negative EGD  . ESOPHAGOGASTRODUODENOSCOPY N/A 06/27/2019   Procedure: ESOPHAGOGASTRODUODENOSCOPY (EGD);  Surgeon: Daneil Dolin, MD;  [facility];  Service: Endoscopy;  Laterality: N/A;  7:30am  . TONSILLECTOMY    . TOTAL HIP ARTHROPLASTY Right 05/02/2019   Procedure: RIGHT TOTAL HIP ARTHROPLASTY ANTERIOR APPROACH;  Surgeon: Mcarthur Rossetti, MD;  Location: [facility];  Service: Orthopedics;  Laterality: Right;    FAMHx:  Family History  Problem Relation Age of Onset  . Heart disease Mother 69  . Dementia Mother   . Kidney disease Father   . Thyroid nodules Father   . Colon cancer Neg Hx     SOCHx:   reports that she has never smoked. She has never used smokeless tobacco. She reports that she does not drink alcohol and does not use drugs.  ALLERGIES:  No Known Allergies  ROS: Pertinent items noted in HPI and remainder of comprehensive ROS otherwise negative.  HOME MEDS: Current Outpatient Medications on File Prior to Visit  Medication Sig Dispense Refill  . Bacillus Coagulans-Inulin (PROBIOTIC-PREBIOTIC PO) Take 1 capsule by mouth in the morning. Digestive Advantage     . Carboxymethylcellulose Sodium (THERATEARS) 0.25 % SOLN Apply 1 drop to eye daily as needed (dry eyes).     . Cholecalciferol (VITAMIN D3 PO) Take 2,000 Units by mouth daily.    Marland Kitchen ezetimibe (ZETIA) 10 MG tablet TAKE 1 TABLET(10 MG) BY MOUTH DAILY (Patient taking differently: Take 10 mg by mouth daily. ) 30 tablet 11  . famotidine (PEPCID) 20 MG tablet TAKE 1 TABLET(20 MG) BY MOUTH AT BEDTIME 30 tablet 5  . Glucosamine-Chondroitin (OSTEO BI-FLEX REGULAR STRENGTH PO) Take 1 tablet by mouth daily.     . Methylcellulose, Laxative, 500 MG TABS Take 1,000 mg by mouth daily.     . Multiple Vitamins-Minerals (ONE DAILY WOMENS 50 PLUS PO) Take 0.5 tablets by mouth See admin instructions. Mon, Wed Fri and Sat  Centrum Silver 50+    . pantoprazole (PROTONIX) 40 MG tablet TAKE 1 TABLET(40 MG) BY  MOUTH TWICE DAILY BEFORE A MEAL 60 tablet 5  . polyethylene glycol (MIRALAX / GLYCOLAX) packet Take 17 g by mouth daily.    . rosuvastatin (CRESTOR) 5 MG tablet Take 1 tablet (5 mg total) by mouth once a week. Sunday 12 tablet 3  . SYNTHROID 50 MCG tablet Take 1 tablet (50 mcg total) by mouth daily. (Patient taking differently: Take 50 mcg by mouth daily before breakfast. ) 30 tablet 11  . valsartan (DIOVAN) 320 MG tablet Take 320 mg by mouth at bedtime.      No current facility-administered medications on file prior to visit.    LABS/IMAGING: No results found for this or any previous visit (from the past 48 hour(s)). No results found.  WEIGHTS: Wt Readings from Last 3 Encounters:  10/14/19 187 lb (84.8 kg)  07/15/19 186 lb (84.4 kg)  06/27/19 182 lb (82.6 kg)  VITALS: BP 134/82   Pulse 69   Ht 5\' 5"  (1.651 m)   Wt 187 lb (84.8 kg)   SpO2 98%   BMI 31.12 kg/m   EXAM: General appearance: alert and no distress Neck: no carotid bruit, no JVD and thyroid not enlarged, symmetric, no tenderness/mass/nodules Lungs: clear to auscultation bilaterally Heart: regular rate and rhythm Abdomen: soft, non-tender; bowel sounds normal; no masses,  no organomegaly Extremities: extremities normal, atraumatic, no cyanosis or edema Pulses: 2+ and symmetric Skin: Skin color, texture, turgor normal. No rashes or lesions Neurologic: Grossly normal Psych: Pleasant  EKG: Normal sinus rhythm with sinus arrhythmia at 71-personally reviewed  ASSESSMENT: 1. Intermittent chest pain - negative stress echo (2018) 2. Hypertension-controlled 3. Dyslipidemia, at goal LDL less than 100 4. Family history of coronary disease 5. Murmur 6. Hypothyroidism  PLAN: 1.   Mrs. Myer remains asymptomatic without any recurrent chest pain.  She got through her surgery for hip replacement without any issues.  Blood pressure is controlled.  Her LDL has been at target.  Changes on her medications today.   Follow-up with me annually or sooner as necessary.  Pixie Casino, MD, [facility], [place] Director of the [facility] Diplomate of the American Board of Clinical Lipidology Attending Cardiologist  Direct Dial: [phone]  Fax: [phone]  Website:  [email or web address] 10/14/2019, 11:18 AM

## 2019-10-16 ENCOUNTER — Encounter: Payer: Self-pay | Admitting: Internal Medicine

## 2019-12-01 ENCOUNTER — Other Ambulatory Visit: Payer: Self-pay | Admitting: Internal Medicine

## 2020-01-06 ENCOUNTER — Telehealth: Payer: Self-pay | Admitting: Internal Medicine

## 2020-01-06 NOTE — Telephone Encounter (Signed)
Pt has questions for the nurse about the tests she had done in 2021. [phone] or [phone]

## 2020-01-06 NOTE — Telephone Encounter (Signed)
Spoke with pt. Pt had an EGD 06/27/19 and states she was also referred to Dr. Lovell Sheehan due to the gall stones and pain she was experienced. Pt state Dr. Lovell Sheehan didn't feel the gallbladder needed to be removed per pt. Pt is still having some pain in her mid upper abdomen and near her lower sternum. Pt would like to know if there is a specific test that will tell her that her pain is coming from her gallbladder? Please advise.

## 2020-01-06 NOTE — Telephone Encounter (Signed)
Noted. Pt has been scheduled with Dr. Jena Gauss on 01/30/19. Please place on cancellation list if Tana Coast, PA or Dr. Jena Gauss has anything sooner.

## 2020-01-06 NOTE — Telephone Encounter (Signed)
We have not seen her since 06/2019. Would advise office visit.

## 2020-01-19 ENCOUNTER — Ambulatory Visit: Payer: Federal, State, Local not specified - PPO | Admitting: Orthopaedic Surgery

## 2020-01-26 ENCOUNTER — Encounter: Payer: Self-pay | Admitting: Orthopaedic Surgery

## 2020-01-26 ENCOUNTER — Ambulatory Visit: Payer: Federal, State, Local not specified - PPO | Admitting: Orthopaedic Surgery

## 2020-01-26 ENCOUNTER — Ambulatory Visit: Payer: Self-pay

## 2020-01-26 DIAGNOSIS — Z96641 Presence of right artificial hip joint: Secondary | ICD-10-CM

## 2020-01-26 NOTE — Progress Notes (Signed)
Office Visit Note   Patient: Eileen Green           Date of Birth: 1950/05/14           MRN: [id number] Visit Date: 01/26/2020              Requested by: Asencion Noble, MD [address] [place],  [place] [address] PCP: Asencion Noble, MD   Assessment & Plan: Visit Diagnoses:  1. Status post total replacement of right hip     Plan: I did offer her a steroid injection in both shoulders but she is declining this for now.  From a hip standpoint, follow-up can be as needed since she is doing so well.  All question concerns were answered and addressed.  Follow-Up Instructions: Return if symptoms worsen or fail to improve.   Orders:  Orders Placed This Encounter  Procedures  . XR HIP UNILAT W OR W/O PELVIS 1V RIGHT   No orders of the defined types were placed in this encounter.     Procedures: No procedures performed   Clinical Data: No additional findings.   Subjective: Chief Complaint  Patient presents with  . Right Hip - Follow-up  The patient is now 9 months status post a right total hip arthroplasty.  She says the hip is doing well.  There are some pain and stiffness in the morning but overall she is making more progress with putting her shoes and socks on that side and has no significant issues.  She has been dealing with some chronic bilateral shoulder pain with overhead activities.  HPI  Review of Systems There is currently listed no headache, chest pain, short of breath, fever, chills, nausea, vomiting  Objective: Vital Signs: There were no vitals taken for this visit.  Physical Exam She is alert and orient x3 and in no acute distress Ortho Exam Examination of her right operative hip shows that moves smoothly and fluidly with no problems at all.  Both shoulders are examined and show excellent strength with no deficits of the rotator cuff.  There is signs of impingement bilaterally.  There is negative lift off bilaterally and range of motion is full with both  shoulders. Specialty Comments:  No specialty comments available.  Imaging: XR HIP UNILAT W OR W/O PELVIS 1V RIGHT  Result Date: 01/26/2020 An AP pelvis and lateral of the right hip shows a well-seated total hip arthroplasty with no complicating features.    PMFS History: Patient Active Problem List   Diagnosis Date Noted  . Status post total replacement of right hip 05/02/2019  . Pre-operative clearance 03/31/2019  . Family history of coronary artery disease 03/31/2019  . Dysphagia 03/05/2019  . Abdominal pain, epigastric 03/05/2019  . Unilateral primary osteoarthritis, right hip 08/14/2018  . Epigastric discomfort 12/29/2016  . Dyslipidemia 01/13/2016  . Murmur 01/13/2016  . Essential hypertension 01/13/2016  . Hiatal hernia   . Dyspepsia 03/18/2014  . RLQ abdominal pain 08/08/2013  . 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 HYPOTHYROIDISM 11/05/2008  . Constipation 11/04/2008  . GERD 06/18/2008  . Chest pain 06/18/2008   Past Medical History:  Diagnosis Date  . Arthritis   . Cancer ([place])    skin melanoma under left eye  . Deaf    LEFT EAR  . GERD (gastroesophageal reflux disease)   . Hyperlipidemia Deaf in Left ear  . Hypertension   . Hypothyroidism     Family History  Problem Relation Age of Onset  . Heart  disease Mother 67  . Dementia Mother   . Kidney disease Father   . Thyroid nodules Father   . Colon cancer Neg Hx     Past Surgical History:  Procedure Laterality Date  . ABDOMINAL HYSTERECTOMY  2001   Partial  . COLONOSCOPY  01/21/2003   [facility]:[id number] rectum/ Long capacious colon, moderate prep made the exam more challenging.  Marland Kitchen COLONOSCOPY  05/18/2009   Dr. Gala Romney: Normal rectum/narrow mouth left sided diverticula   . COLONOSCOPY N/A 05/14/2013   Dr. Gala Romney: hemorrhoids, colonic diverticula, redudant colon  . ESOPHAGOGASTRODUODENOSCOPY  01/21/2003   RMR:  Normal esophagus, stomach, D1 and D2  . ESOPHAGOGASTRODUODENOSCOPY N/A 03/24/2014   Dr. Gala Romney  :Hiatal hernia; otherwise negative EGD  . ESOPHAGOGASTRODUODENOSCOPY N/A 06/27/2019   Procedure: ESOPHAGOGASTRODUODENOSCOPY (EGD);  Surgeon: Daneil Dolin, MD;  [facility];  Service: Endoscopy;  Laterality: N/A;  7:30am  . TONSILLECTOMY    . TOTAL HIP ARTHROPLASTY Right 05/02/2019   Procedure: RIGHT TOTAL HIP ARTHROPLASTY ANTERIOR APPROACH;  Surgeon: Mcarthur Rossetti, MD;  Location: [facility];  Service: Orthopedics;  Laterality: Right;   Social History   Occupational History  . 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 Smoking status: Never Smoker  . Smokeless tobacco: Never Used  . Tobacco comment: Never smoked  Vaping Use  . Vaping Use: Never used  Substance and Sexual Activity  . Alcohol use: No  . Drug use: No  . Sexual activity: Not on file

## 2020-01-27 ENCOUNTER — Other Ambulatory Visit: Payer: Self-pay

## 2020-01-27 ENCOUNTER — Encounter (HOSPITAL_COMMUNITY): Payer: Self-pay | Admitting: Emergency Medicine

## 2020-01-27 ENCOUNTER — Emergency Department (HOSPITAL_COMMUNITY)
Admission: EM | Admit: 2020-01-27 | Discharge: 2020-01-27 | Disposition: A | Discharge status: Home / Self Care | Payer: Federal, State, Local not specified - PPO | Attending: Emergency Medicine | Admitting: Emergency Medicine

## 2020-01-27 DIAGNOSIS — K219 Gastro-esophageal reflux disease without esophagitis: Secondary | ICD-10-CM | POA: Insufficient documentation

## 2020-01-27 DIAGNOSIS — I10 Essential (primary) hypertension: Secondary | ICD-10-CM | POA: Insufficient documentation

## 2020-01-27 DIAGNOSIS — K8020 Calculus of gallbladder without cholecystitis without obstruction: Secondary | ICD-10-CM | POA: Insufficient documentation

## 2020-01-27 DIAGNOSIS — Z96641 Presence of right artificial hip joint: Secondary | ICD-10-CM | POA: Diagnosis not present

## 2020-01-27 DIAGNOSIS — Z79899 Other long term (current) drug therapy: Secondary | ICD-10-CM | POA: Diagnosis not present

## 2020-01-27 DIAGNOSIS — Z90711 Acquired absence of uterus with remaining cervical stump: Secondary | ICD-10-CM | POA: Diagnosis not present

## 2020-01-27 DIAGNOSIS — E039 Hypothyroidism, unspecified: Secondary | ICD-10-CM | POA: Insufficient documentation

## 2020-01-27 DIAGNOSIS — Z85828 Personal history of other malignant neoplasm of skin: Secondary | ICD-10-CM | POA: Insufficient documentation

## 2020-01-27 DIAGNOSIS — R1011 Right upper quadrant pain: Secondary | ICD-10-CM | POA: Diagnosis present

## 2020-01-27 NOTE — ED Triage Notes (Signed)
  Epigastric pain started at [facility].  Episode on Sunday 3am and this has ongoing for about one year.  Rates pain 7/10 but increases to 10/10.

## 2020-01-27 NOTE — ED Provider Notes (Signed)
[facility] EMERGENCY DEPARTMENT Provider Note   CSN: [id number] Arrival date & time: 01/27/20  [id number]     History Chief Complaint  Patient presents with  . Abdominal Pain    Eileen Green is a 70 y.o. female.  HPI   She is here for evaluation of episodes of epigastric and right upper quadrant pain, intermittently over the last year.  She has been diagnosed with gallstones, but also has been treated during this period of time for GERD.  She has had endoscopy which showed changes secondary to GERD.  She is on appropriate medicines for GERD.  Last night she had severe pain onset before bedtime, around 1230 a.m. after eating supper and having a snack about 8 PM.  Pain resolved spontaneously after several hours.  She is not on a low-fat diet at this time.  She denies fever, chills, nausea, vomiting.  Several days ago she had similar pain, and it was associated with a single episode of vomiting.  She has appointment with her GI doctor, this coming Friday that she is scheduled after the pain episode on 01/23/2020.  There are no other known modifying factors.  Past Medical History:  Diagnosis Date  . Arthritis   . Cancer ([place])    skin melanoma under left eye  . Deaf    LEFT EAR  . GERD (gastroesophageal reflux disease)   . Hyperlipidemia Deaf in Left ear  . Hypertension   . Hypothyroidism     Patient Active Problem List   Diagnosis Date Noted  . Status post total replacement of right hip 05/02/2019  . Pre-operative clearance 03/31/2019  . Family history of coronary artery disease 03/31/2019  . Dysphagia 03/05/2019  . Abdominal pain, epigastric 03/05/2019  . Unilateral primary osteoarthritis, right hip 08/14/2018  . Epigastric discomfort 12/29/2016  . Dyslipidemia 01/13/2016  . Murmur 01/13/2016  . Essential hypertension 01/13/2016  . Hiatal hernia   . Dyspepsia 03/18/2014  . RLQ abdominal pain 08/08/2013  . 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 HYPOTHYROIDISM 11/05/2008   . Constipation 11/04/2008  . GERD 06/18/2008  . Chest pain 06/18/2008    Past Surgical History:  Procedure Laterality Date  . ABDOMINAL HYSTERECTOMY  2001   Partial  . COLONOSCOPY  01/21/2003   [id number] rectum/ Long capacious colon, moderate prep made the exam more challenging.  Marland Kitchen COLONOSCOPY  05/18/2009   Dr. Gala Romney: Normal rectum/narrow mouth left sided diverticula   . COLONOSCOPY N/A 05/14/2013   Dr. Gala Romney: hemorrhoids, colonic diverticula, redudant colon  . ESOPHAGOGASTRODUODENOSCOPY  01/21/2003   RMR:  Normal esophagus, stomach, D1 and D2  . ESOPHAGOGASTRODUODENOSCOPY N/A 03/24/2014   Dr. Gala Romney :Hiatal hernia; otherwise negative EGD  . ESOPHAGOGASTRODUODENOSCOPY N/A 06/27/2019   Procedure: ESOPHAGOGASTRODUODENOSCOPY (EGD);  Surgeon: Daneil Dolin, MD;  Location: [facility];  Service: Endoscopy;  Laterality: N/A;  7:30am  . TONSILLECTOMY    . TOTAL HIP ARTHROPLASTY Right 05/02/2019   Procedure: RIGHT TOTAL HIP ARTHROPLASTY ANTERIOR APPROACH;  Surgeon: Mcarthur Rossetti, MD;  Location: [facility] ORS;  Service: Orthopedics;  Laterality: Right;     OB History   No obstetric history on file.     Family History  Problem Relation Age of Onset  . Heart disease Mother 64  . Dementia Mother   . Kidney disease Father   . Thyroid nodules Father   . Colon cancer Neg Hx     Social History   Tobacco Use  . Smoking status: Never Smoker  . Smokeless tobacco:  Never Used  . Tobacco comment: Never smoked  Vaping Use  . Vaping Use: Never used  Substance Use Topics  . Alcohol use: No  . Drug use: No    Home Medications Prior to Admission medications   Medication Sig Start Date End Date Taking? Authorizing Provider  Bacillus Coagulans-Inulin (PROBIOTIC-PREBIOTIC PO) Take 1 capsule by mouth in the morning. Digestive Advantage     [provider]  Carboxymethylcellulose Sodium (THERATEARS) 0.25 % SOLN Apply 1 drop to eye daily as needed (dry eyes).     [provider]  Cholecalciferol (VITAMIN D3 PO) Take 2,000 Units by mouth daily.    [provider]  ezetimibe (ZETIA) 10 MG tablet TAKE 1 TABLET(10 MG) BY MOUTH DAILY 12/01/19   Hilty, Nadean Corwin, MD  famotidine (PEPCID) 20 MG tablet TAKE 1 TABLET(20 MG) BY MOUTH AT BEDTIME 08/05/19   Mahala Menghini, PA-C  Glucosamine-Chondroitin (OSTEO BI-FLEX REGULAR STRENGTH PO) Take 1 tablet by mouth daily.     [provider]  Methylcellulose, Laxative, 500 MG TABS Take 1,000 mg by mouth daily.     [provider]  Multiple Vitamins-Minerals (ONE DAILY WOMENS 50 PLUS PO) Take 0.5 tablets by mouth See admin instructions. Mon, Wed Fri and Sat  Centrum Silver 50+    [provider]  pantoprazole (PROTONIX) 40 MG tablet TAKE 1 TABLET(40 MG) BY MOUTH TWICE DAILY BEFORE A MEAL 08/11/19   Mahala Menghini, PA-C  polyethylene glycol (MIRALAX / GLYCOLAX) packet Take 17 g by mouth daily.    [provider]  rosuvastatin (CRESTOR) 5 MG tablet Take 1 tablet (5 mg total) by mouth once a week. Sunday 07/14/19   Pixie Casino, MD  SYNTHROID 50 MCG tablet Take 1 tablet (50 mcg total) by mouth daily. Patient taking differently: Take 50 mcg by mouth daily before breakfast.  06/04/13   Armandina Gemma, MD  valsartan (DIOVAN) 320 MG tablet Take 320 mg by mouth at bedtime.  09/08/18   [provider]    Allergies    Patient has no known allergies.  Review of Systems   Review of Systems  All other systems reviewed and are negative.   Physical Exam Updated Vital Signs BP 139/90 (BP Location: Right Arm)   Pulse 94   Temp 98.2 F (36.8 C) (Oral)   Resp 16   Ht 5\' 5"  (1.651 m)   Wt 83.9 kg   SpO2 98%   BMI 30.79 kg/m   Physical Exam Vitals and nursing note reviewed.  Constitutional:      General: She is not in acute distress.    Appearance: She is well-developed and well-nourished. She is obese. She is not ill-appearing, toxic-appearing or diaphoretic.  HENT:      Head: Normocephalic and atraumatic.     Right Ear: External ear normal.     Left Ear: External ear normal.  Eyes:     Extraocular Movements: EOM normal.     Conjunctiva/sclera: Conjunctivae normal.     Pupils: Pupils are equal, round, and reactive to light.  Neck:     Trachea: Phonation normal.  Cardiovascular:     Rate and Rhythm: Normal rate.  Pulmonary:     Effort: Pulmonary effort is normal.  Chest:     Chest wall: No bony tenderness.  Abdominal:     Palpations: Abdomen is soft.     Tenderness: There is no abdominal tenderness.  Musculoskeletal:        General: Normal  range of motion.     Cervical back: Normal range of motion and neck supple.  Skin:    General: Skin is warm, dry and intact.  Neurological:     Mental Status: She is alert and oriented to person, place, and time.     Cranial Nerves: No cranial nerve deficit.     Sensory: No sensory deficit.     Motor: No abnormal muscle tone.     Coordination: Coordination normal.  Psychiatric:        Mood and Affect: Mood and affect and mood normal.        Behavior: Behavior normal.        Thought Content: Thought content normal.        Judgment: Judgment normal.     ED Results / Procedures / Treatments   Labs (all labs ordered are listed, but only abnormal results are displayed) Labs Reviewed - No data to display  EKG None  Radiology XR HIP UNILAT W OR W/O PELVIS 1V RIGHT  Result Date: 01/26/2020 An AP pelvis and lateral of the right hip shows a well-seated total hip arthroplasty with no complicating features.   Procedures Procedures   Medications Ordered in ED Medications - No data to display  ED Course  I have reviewed the triage vital signs and the nursing notes.  Pertinent labs & imaging results that were available during my care of the patient were reviewed by me and considered in my medical decision making (see chart for details).    MDM Rules/Calculators/A&P                            Patient Vitals for the past 24 hrs:  BP Temp Temp src Pulse Resp SpO2 Height Weight  01/27/20 0739 - - - - - - 5\' 5"  (1.651 m) 83.9 kg  01/27/20 0737 139/90 98.2 F (36.8 C) Oral 94 16 98 % - -    8:20 AM Reevaluation with update and discussion. After initial assessment and treatment, an updated evaluation reveals she remains comfortable.  Findings discussed with patient all questions answered.Daleen Bo   Medical Decision Making:  This patient is presenting for evaluation of recurrent abdominal pain, which does require a range of treatment options, and is a complaint that involves a moderate risk of morbidity and mortality. The differential diagnoses include GERD, gallbladder disease, nonspecific abdominal problems. I decided to review old records, and in summary patient with known gallstones presenting with pain typical of gallbladder disease, which has resolved by the time I evaluated her.  I did not require additional historical information from anyone.    Critical Interventions-clinical evaluation, discussion with patient, discharge arrangements made  After These Interventions, the Patient was reevaluated and was found stable for discharge.  Patient with signs and symptoms of gallbladder disease with likely spasm versus passage of stone.  She is asymptomatic at time of evaluation does not require heart ER intervention or evaluation.  She has follow-up scheduled with GI, and a surgeon to see for cholecystectomy.  I anticipate that this can be done electively.  She was offered narcotic pain reliever to use as needed, and declined.  CRITICAL CARE-no Performed by: Daleen Bo  Nursing Notes Reviewed/ Care Coordinated Applicable Imaging Reviewed Interpretation of Laboratory Data incorporated into ED treatment  The patient appears reasonably screened and/or stabilized for discharge and I doubt any other medical condition or other [facility] requiring further screening, evaluation, or  treatment in the ED at this time prior to discharge.  Plan: Home Medications-continue usual, APAP for pain; Home Treatments-low-fat diet; return here if the recommended treatment, does not improve the symptoms; Recommended follow up-general surgery for follow-up appointment and GI as scheduled.     Final Clinical Impression(s) / ED Diagnoses Final diagnoses:  Gall stones    Rx / DC Orders ED Discharge Orders    None       Daleen Bo, MD 01/27/20 [phone]

## 2020-01-27 NOTE — Discharge Instructions (Addendum)
Your pain is highly likely to be secondary to gallbladder stones.  Stay on a low-fat diet, to help decrease episodes of gallbladder spasm which tend to cause pain.  Use Tylenol if needed for episodes of pain.  Continue on your regular medicine.  Call the general surgeon for an appointment to be seen about a cholecystectomy.  See your GI doctor, this Friday as scheduled.  Return here as needed.

## 2020-01-30 ENCOUNTER — Ambulatory Visit: Payer: Federal, State, Local not specified - PPO | Admitting: Internal Medicine

## 2020-01-30 ENCOUNTER — Encounter: Payer: Self-pay | Admitting: *Deleted

## 2020-01-30 ENCOUNTER — Encounter: Payer: Self-pay | Admitting: Internal Medicine

## 2020-01-30 ENCOUNTER — Other Ambulatory Visit: Payer: Self-pay | Admitting: *Deleted

## 2020-01-30 ENCOUNTER — Other Ambulatory Visit: Payer: Self-pay

## 2020-01-30 VITALS — BP 139/71 | HR 89 | Temp 97.3°F | Ht 65.0 in | Wt 188.2 lb

## 2020-01-30 DIAGNOSIS — K8020 Calculus of gallbladder without cholecystitis without obstruction: Secondary | ICD-10-CM | POA: Diagnosis not present

## 2020-01-30 DIAGNOSIS — K219 Gastro-esophageal reflux disease without esophagitis: Secondary | ICD-10-CM

## 2020-01-30 DIAGNOSIS — R1013 Epigastric pain: Secondary | ICD-10-CM

## 2020-01-30 NOTE — Patient Instructions (Signed)
Schedule a HIDA scan with fatty meal challenge (post-prandial retro-xyphoid pain )  If you have another attack - call us so we can arrange liver enzymes to be checked in 6 hours (from the onset of pain)  Continue protonix 40 mg twice daily  Continue Pepsid  Further recommendations to follow

## 2020-01-30 NOTE — Progress Notes (Signed)
Primary Care Physician:  Asencion Noble, MD Primary Gastroenterologist:  Dr.   Pre-Procedure History & Physical: HPI:  Eileen Green is a 70 y.o. female here for review of her GERD and ongoing episodes of retroxiphoid /  upper abdominal pain.  She has had 4 significant episodes in the past 1 year.  She differentiates these acute symptoms from her reflux which, fortunately, is well controlled on Protonix 40 mg twice daily and famotidine nightly.  No dysphagia. Barium swallow last year demonstrated no obstruction but full column reflux of contrast. As far as her acute episodes of pain are concerned, 4 episodes are so in the past 1 year.  They typically come on at night they may last upwards of 4 hours and then they subside.  Occasional associated nausea and vomiting.  No fever chills no yellow jaundice clay colored stools dark-colored urine. Ultrasound of the gallbladder reveals innumerable large and small gallstones without biliary dilation.  Seen by Dr. Arnoldo Morale.  Did not feel symptoms were typical for gallbladder disease.  No plans for surgery at that time. Patient recently had her right hip replaced and is doing very well postoperatively. Past Medical History:  Diagnosis Date  . Arthritis   . Cancer ([place])    skin melanoma under left eye  . Deaf    LEFT EAR  . GERD (gastroesophageal reflux disease)   . Hyperlipidemia Deaf in Left ear  . Hypertension   . Hypothyroidism     Past Surgical History:  Procedure Laterality Date  . ABDOMINAL HYSTERECTOMY  2001   Partial  . COLONOSCOPY  01/21/2003   [id number] rectum/ Long capacious colon, moderate prep made the exam more challenging.  Marland Kitchen COLONOSCOPY  05/18/2009   Dr. Gala Romney: Normal rectum/narrow mouth left sided diverticula   . COLONOSCOPY N/A 05/14/2013   Dr. Gala Romney: hemorrhoids, colonic diverticula, redudant colon  . ESOPHAGOGASTRODUODENOSCOPY  01/21/2003   RMR:  Normal esophagus, stomach, D1 and D2  . ESOPHAGOGASTRODUODENOSCOPY N/A  03/24/2014   Dr. Gala Romney :Hiatal hernia; otherwise negative EGD  . ESOPHAGOGASTRODUODENOSCOPY N/A 06/27/2019   Procedure: ESOPHAGOGASTRODUODENOSCOPY (EGD);  Surgeon: Daneil Dolin, MD;  Location: [facility];  Service: Endoscopy;  Laterality: N/A;  7:30am  . TONSILLECTOMY    . TOTAL HIP ARTHROPLASTY Right 05/02/2019   Procedure: RIGHT TOTAL HIP ARTHROPLASTY ANTERIOR APPROACH;  Surgeon: Mcarthur Rossetti, MD;  Location: [facility]S;  Service: Orthopedics;  Laterality: Right;    Prior to Admission medications   Medication Sig Start Date End Date Taking? Authorizing Provider  Bacillus Coagulans-Inulin (PROBIOTIC-PREBIOTIC PO) Take 1 capsule by mouth in the morning. Digestive Advantage   Yes [provider]  Carboxymethylcellulose Sodium 0.25 % SOLN Apply 1 drop to eye daily as needed (dry eyes).    Yes [provider]  Cholecalciferol (VITAMIN D3 PO) Take 2,000 Units by mouth daily.   Yes [provider]  ezetimibe (ZETIA) 10 MG tablet TAKE 1 TABLET(10 MG) BY MOUTH DAILY 12/01/19  Yes Hilty, Nadean Corwin, MD  famotidine (PEPCID) 20 MG tablet TAKE 1 TABLET(20 MG) BY MOUTH AT BEDTIME 08/05/19  Yes Mahala Menghini, PA-C  Glucosamine-Chondroitin (OSTEO BI-FLEX REGULAR STRENGTH PO) Take 1 tablet by mouth daily.    Yes [provider]  Methylcellulose, Laxative, 500 MG TABS Take 1,000 mg by mouth daily.    Yes [provider]  Multiple Vitamins-Minerals (ONE DAILY WOMENS 50 PLUS PO) Take 0.5 tablets by mouth See admin instructions. Mon, Wed Fri and Sat  Centrum Silver  50+   Yes [provider]  pantoprazole (PROTONIX) 40 MG tablet TAKE 1 TABLET(40 MG) BY MOUTH TWICE DAILY BEFORE A MEAL 08/11/19  Yes Mahala Menghini, PA-C  polyethylene glycol (MIRALAX / GLYCOLAX) packet Take 17 g by mouth daily.   Yes [provider]  rosuvastatin (CRESTOR) 5 MG tablet Take 1 tablet (5 mg total) by mouth once a week. Sunday 07/14/19  Yes Hilty, Nadean Corwin, MD   SYNTHROID 50 MCG tablet Take 1 tablet (50 mcg total) by mouth daily. Patient taking differently: Take 50 mcg by mouth daily before breakfast. 06/04/13  Yes Armandina Gemma, MD  valsartan (DIOVAN) 320 MG tablet Take 320 mg by mouth at bedtime.  09/08/18  Yes [provider]    Allergies as of 01/30/2020  . (No Known Allergies)    Family History  Problem Relation Age of Onset  . Heart disease Mother 54  . Dementia Mother   . Kidney disease Father   . Thyroid nodules Father   . Colon cancer Neg Hx     Social History   Socioeconomic History  . Marital status: Married    Spouse name: Not on file  . Number of children: Not on file  . Years of education: Not on file  . Highest education level: Not on file  Occupational History  . 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 Smoking status: Never Smoker  . Smokeless tobacco: Never Used  . Tobacco comment: Never smoked  Vaping Use  . Vaping Use: Never used  Substance and Sexual Activity  . Alcohol use: No  . Drug use: No  . Sexual activity: Not on file  Other Topics Concern  . Not on file  Social History Narrative  . Not on file   Social Determinants of Health   Financial Resource Strain: Not on file  Food Insecurity: Not on file  Transportation Needs: Not on file  Physical Activity: Not on file  Stress: Not on file  Social Connections: Not on file  Intimate Partner Violence: Not on file    Review of Systems: See HPI, otherwise negative ROS  Physical Exam: BP 139/71   Pulse 89   Temp (!) 97.3 F (36.3 C)   Ht 5\' 5"  (1.651 m)   Wt 188 lb 3.2 oz (85.4 kg)   BMI 31.32 kg/m  General:   Alert,   well-nourished, pleasant and cooperative in NAD Neck:  Supple; no masses or thyromegaly. No significant cervical adenopathy. Lungs:  Clear throughout to auscultation.   No wheezes, crackles, or rhonchi. No acute distress. Heart:  Regular rate and rhythm; no  murmurs, clicks, rubs,  or gallops. Abdomen: Non-distended, normal bowel sounds.  Soft and nontender without appreciable mass or hepatosplenomegaly.  Pulses:  Normal pulses noted. Extremities:  Without clubbing or edema.  Impression/Plan: 70 year old lady with GERD; now well controlled on Protonix 40 mg twice daily and famotidine at bedtime. Episodic retroxiphoid upper abdominal pain.  Known cholelithiasis.  I suspect that her recurrent "attacks" are a result of symptomatic gallbladder disease.  Passage of small stones intermittently very much a possibility.  We do not have LFTs temporally related to the onset of her pain previously. In retrospect, some of her reflux related symptoms could be biliary in origin.  Findings of prior EGD reassuring.  Recommendations:  Schedule a HIDA scan with fatty meal challenge (post-prandial retro-xyphoid pain )  If another attack,  -I  have given her written orders to present to the hospital for stat labs (LFTs, lipase to be drawn 6 hours after the onset of symptoms  Continue protonix 40 mg twice daily  Continue Pepsid  Further recommendations to follow     Notice: This dictation was prepared with Dragon dictation along with smaller phrase technology. Any transcriptional errors that result from this process are unintentional and may not be corrected upon review.

## 2020-02-10 ENCOUNTER — Encounter (HOSPITAL_COMMUNITY)
Admission: RE | Admit: 2020-02-10 | Discharge: 2020-02-10 | Disposition: A | Discharge status: Home / Self Care | Payer: Federal, State, Local not specified - PPO | Source: Ambulatory Visit | Attending: Internal Medicine | Admitting: Internal Medicine

## 2020-02-10 ENCOUNTER — Other Ambulatory Visit: Payer: Self-pay

## 2020-02-10 ENCOUNTER — Other Ambulatory Visit: Payer: Self-pay | Admitting: Internal Medicine

## 2020-02-10 ENCOUNTER — Other Ambulatory Visit: Payer: Self-pay | Admitting: Gastroenterology

## 2020-02-10 DIAGNOSIS — R1013 Epigastric pain: Secondary | ICD-10-CM | POA: Insufficient documentation

## 2020-02-10 DIAGNOSIS — K219 Gastro-esophageal reflux disease without esophagitis: Secondary | ICD-10-CM

## 2020-02-10 MED ORDER — MORPHINE SULFATE (PF) 4 MG/ML IV SOLN
3.4000 mg | Freq: Once | INTRAVENOUS | Status: AC
  Filled 2020-02-10: qty 0.9

## 2020-02-10 MED ORDER — MORPHINE SULFATE (PF) 4 MG/ML IV SOLN
INTRAVENOUS | Status: AC
  Administered 2020-02-10: 3.4 mg via INTRAVENOUS
  Filled 2020-02-10: qty 1

## 2020-02-10 MED ORDER — TECHNETIUM TC 99M MEBROFENIN IV KIT
5.0000 | PACK | Freq: Once | INTRAVENOUS | Status: AC | PRN
  Administered 2020-02-10: 5.2 via INTRAVENOUS

## 2020-02-12 ENCOUNTER — Other Ambulatory Visit: Payer: Self-pay | Admitting: *Deleted

## 2020-02-12 DIAGNOSIS — R109 Unspecified abdominal pain: Secondary | ICD-10-CM

## 2020-02-17 ENCOUNTER — Other Ambulatory Visit: Payer: Self-pay | Admitting: Gastroenterology

## 2020-02-26 ENCOUNTER — Ambulatory Visit: Payer: Federal, State, Local not specified - PPO | Admitting: General Surgery

## 2020-03-02 ENCOUNTER — Ambulatory Visit: Payer: Federal, State, Local not specified - PPO | Admitting: General Surgery

## 2020-03-16 ENCOUNTER — Ambulatory Visit: Payer: Federal, State, Local not specified - PPO | Admitting: General Surgery

## 2020-03-25 ENCOUNTER — Other Ambulatory Visit: Payer: Self-pay

## 2020-03-25 ENCOUNTER — Ambulatory Visit (INDEPENDENT_AMBULATORY_CARE_PROVIDER_SITE_OTHER): Payer: Federal, State, Local not specified - PPO | Admitting: General Surgery

## 2020-03-25 ENCOUNTER — Encounter: Payer: Self-pay | Admitting: General Surgery

## 2020-03-25 VITALS — BP 122/79 | HR 69 | Temp 98.2°F | Resp 14 | Ht 65.0 in | Wt 190.0 lb

## 2020-03-25 DIAGNOSIS — K8020 Calculus of gallbladder without cholecystitis without obstruction: Secondary | ICD-10-CM

## 2020-03-25 NOTE — Patient Instructions (Signed)
Minimally Invasive Cholecystectomy Minimally invasive cholecystectomy is surgery to remove the gallbladder. The gallbladder is a pear-shaped organ that lies beneath the liver on the right side of the body. The gallbladder stores bile, which is a fluid that helps the body digest fats. Cholecystectomy is often done to treat inflammation of the gallbladder (cholecystitis). This condition is usually caused by a buildup of gallstones (cholelithiasis) in the gallbladder. Gallstones can block the flow of bile, which can result in inflammation and pain. In severe cases, emergency surgery may be required. This procedure is done though small incisions in the abdomen, instead of one large incision. It is also called laparoscopic surgery. A thin scope with a camera (laparoscope) is inserted through one incision. Then surgical instruments are inserted through the other incisions. In some cases, a minimally invasive surgery may need to be changed to a surgery that is done through a larger incision. This is called open surgery. Tell a health care provider about:  Any allergies you have.  All medicines you are taking, including vitamins, herbs, eye drops, creams, and over-the-counter medicines.  Any problems you or family members have had with anesthetic medicines.  Any blood disorders you have.  Any surgeries you have had.  Any medical conditions you have.  Whether you are pregnant or may be pregnant. What are the risks? Generally, this is a safe procedure. However, problems may occur, including:  Infection.  Bleeding.  Allergic reactions to medicines.  Damage to nearby structures or organs.  A stone remaining in the common bile duct. The common bile duct carries bile from the gallbladder into the small intestine.  A bile leak from the cyst duct that is clipped when your gallbladder is removed. What happens before the procedure? Medicines Ask your health care provider about:  Changing or  stopping your regular medicines. This is especially important if you are taking diabetes medicines or blood thinners.  Taking medicines such as aspirin and ibuprofen. These medicines can thin your blood. Do not take these medicines unless your health care provider tells you to take them.  Taking over-the-counter medicines, vitamins, herbs, and supplements. General instructions  Let your health care provider know if you develop a cold or an infection before surgery.  Plan to have someone take you home from the hospital or clinic.  If you will be going home right after the procedure, plan to have someone with you for 24 hours.  Ask your health care provider: ? How your surgery site will be marked. ? What steps will be taken to help prevent infection. These may include:  Removing hair at the surgery site.  Washing skin with a germ-killing soap.  Taking antibiotic medicine. What happens during the procedure?  An IV will be inserted into one of your veins.  You will be given one or both of the following: ? A medicine to help you relax (sedative). ? A medicine to make you fall asleep (general anesthetic).  A breathing tube will be placed in your mouth.  Your surgeon will make several small incisions in your abdomen.  The laparoscope will be inserted through one of the small incisions. The camera on the laparoscope will send images to a monitor in the operating room. This lets your surgeon see inside your abdomen.  A gas will be pumped into your abdomen. This will expand your abdomen to give the surgeon more room to perform the surgery.  Other tools that are needed for the procedure will be inserted through the   other incisions. The gallbladder will be removed through one of the incisions.  Your common bile duct may be examined. If stones are found in the common bile duct, they may be removed.  After your gallbladder has been removed, the incisions will be closed with stitches  (sutures), staples, or skin glue.  Your incisions may be covered with a bandage (dressing). The procedure may vary among health care providers and hospitals.   What happens after the procedure?  Your blood pressure, heart rate, breathing rate, and blood oxygen level will be monitored until you leave the hospital or clinic.  You will be given medicines as needed to control your pain.  If you were given a sedative during the procedure, it can affect you for several hours. Do not drive or operate machinery until your health care provider says that it is safe. Summary  Minimally invasive cholecystectomy, also called laparoscopic cholecystectomy, is surgery to remove the gallbladder using small incisions.  Tell your health care provider about all the medical conditions you have and all the medicines you are taking for those conditions.  Before the procedure, follow instructions about eating or drinking restrictions and changing or stopping medicines.  If you were given a sedative during the procedure, it can affect you for several hours. Do not drive or operate machinery until your health care provider says that it is safe. This information is not intended to replace advice given to you by your health care provider. Make sure you discuss any questions you have with your health care provider. Document Revised: 09/23/2018 Document Reviewed: 09/23/2018 Elsevier Patient Education  2021 [employer]  

## 2020-03-26 NOTE — Progress Notes (Signed)
Eileen Green; [id number]; 1950-07-23   HPI Patient is a 70 year old white female who was referred to my care by Dr. Gala Romney for evaluation and treatment of cholelithiasis.  I saw the patient back in July 2021 for cholelithiasis.  At that time, she was the most part asymptomatic from her cholelithiasis.  She recently was seen in the emergency room in January 2022 for right upper quadrant and epigastric abdominal pain and nausea.  This subsequently resolved by the time she was seen in the emergency room.  She was seen by Dr. Gala Romney who performed a HIDA scan.  HIDA scan revealed a patent cystic duct, but morphine had to be given during the test, and an ejection fraction could not be calculated.  Patient states that she still has intermittent episodes of epigastric and right upper quadrant abdominal pain and bloating.  She denies any fever, chills, or jaundice. Past Medical History:  Diagnosis Date  . Arthritis   . Cancer ([place])    skin melanoma under left eye  . Deaf    LEFT EAR  . GERD (gastroesophageal reflux disease)   . Hyperlipidemia Deaf in Left ear  . Hypertension   . Hypothyroidism     Past Surgical History:  Procedure Laterality Date  . ABDOMINAL HYSTERECTOMY  2001   Partial  . COLONOSCOPY  01/21/2003   [id number] rectum/ Long capacious colon, moderate prep made the exam more challenging.  Marland Kitchen COLONOSCOPY  05/18/2009   Dr. Gala Romney: Normal rectum/narrow mouth left sided diverticula   . COLONOSCOPY N/A 05/14/2013   Dr. Gala Romney: hemorrhoids, colonic diverticula, redudant colon  . ESOPHAGOGASTRODUODENOSCOPY  01/21/2003   RMR:  Normal esophagus, stomach, D1 and D2  . ESOPHAGOGASTRODUODENOSCOPY N/A 03/24/2014   Dr. Gala Romney :Hiatal hernia; otherwise negative EGD  . ESOPHAGOGASTRODUODENOSCOPY N/A 06/27/2019   Procedure: ESOPHAGOGASTRODUODENOSCOPY (EGD);  Surgeon: Daneil Dolin, MD;  [facility];  Service: Endoscopy;  Laterality: N/A;  7:30am  . TONSILLECTOMY    . TOTAL HIP ARTHROPLASTY  Right 05/02/2019   Procedure: RIGHT TOTAL HIP ARTHROPLASTY ANTERIOR APPROACH;  Surgeon: Mcarthur Rossetti, MD;  Location: [facility];  Service: Orthopedics;  Laterality: Right;    Family History  Problem Relation Age of Onset  . Heart disease Mother 74  . Dementia Mother   . Kidney disease Father   . Thyroid nodules Father   . Colon cancer Neg Hx     Current Outpatient Medications on File Prior to Visit  Medication Sig Dispense Refill  . Bacillus Coagulans-Inulin (PROBIOTIC-PREBIOTIC PO) Take 1 capsule by mouth in the morning. Digestive Advantage    . Carboxymethylcellulose Sodium 0.25 % SOLN Apply 1 drop to eye daily as needed (dry eyes).     . Cholecalciferol (VITAMIN D3 PO) Take 2,000 Units by mouth daily.    Marland Kitchen ezetimibe (ZETIA) 10 MG tablet TAKE 1 TABLET(10 MG) BY MOUTH DAILY 30 tablet 11  . famotidine (PEPCID) 20 MG tablet TAKE 1 TABLET(20 MG) BY MOUTH AT BEDTIME 30 tablet 5  . Glucosamine-Chondroitin (OSTEO BI-FLEX REGULAR STRENGTH PO) Take 1 tablet by mouth daily.     . Methylcellulose, Laxative, 500 MG TABS Take 1,000 mg by mouth daily.     . Multiple Vitamins-Minerals (ONE DAILY WOMENS 50 PLUS PO) Take 0.5 tablets by mouth See admin instructions. Mon, Wed Fri and Sat  Centrum Silver 50+    . pantoprazole (PROTONIX) 40 MG tablet TAKE 1 TABLET(40 MG) BY MOUTH TWICE DAILY BEFORE A MEAL 60 tablet 5  . polyethylene glycol (  MIRALAX / GLYCOLAX) packet Take 17 g by mouth daily.    . rosuvastatin (CRESTOR) 5 MG tablet Take 1 tablet (5 mg total) by mouth once a week. Sunday 12 tablet 3  . SYNTHROID 50 MCG tablet Take 1 tablet (50 mcg total) by mouth daily. (Patient taking differently: Take 50 mcg by mouth daily before breakfast.) 30 tablet 11  . valsartan (DIOVAN) 320 MG tablet Take 320 mg by mouth at bedtime.      No current facility-administered medications on file prior to visit.    No Known Allergies  Social History   Substance and Sexual Activity  Alcohol Use No     Social History   Tobacco Use  Smoking Status Never Smoker  Smokeless Tobacco Never Used  Tobacco Comment   Never smoked    Review of Systems  Constitutional: Negative.   HENT: Negative.   Eyes: Negative.   Respiratory: Negative.   Cardiovascular: Negative.   Gastrointestinal: Positive for abdominal pain and heartburn.  Genitourinary: Negative.   Musculoskeletal: Positive for joint pain and neck pain.  Skin: Negative.   Neurological: Negative.   Endo/Heme/Allergies: Negative.   Psychiatric/Behavioral: Negative.     Objective   Vitals:   03/25/20 1135  BP: 122/79  Pulse: 69  Resp: 14  Temp: 98.2 F (36.8 C)  SpO2: 96%    Physical Exam Vitals reviewed.  Constitutional:      Appearance: Normal appearance. She is not ill-appearing.  HENT:     Head: Normocephalic and atraumatic.  Cardiovascular:     Rate and Rhythm: Normal rate and regular rhythm.     Heart sounds: Normal heart sounds. No murmur heard. No friction rub. No gallop.   Pulmonary:     Effort: Pulmonary effort is normal. No respiratory distress.     Breath sounds: Normal breath sounds. No stridor. No wheezing, rhonchi or rales.  Abdominal:     General: Bowel sounds are normal. There is no distension.     Palpations: Abdomen is soft. There is no mass.     Tenderness: There is abdominal tenderness. There is no guarding or rebound.     Hernia: No hernia is present.     Comments: Slight discomfort in the right upper quadrant to palpation.  No rigidity is noted.  Skin:    General: Skin is warm and dry.  Neurological:     Mental Status: She is alert and oriented to person, place, and time.   Previous notes reviewed  Assessment  Biliary colic, cholelithiasis Plan   At this point, I did recommend a laparoscopic cholecystectomy.  Patient is hesitant and would like to think about the surgery.  The risks and benefits of the procedure including bleeding, infection, hepatobiliary injury, and the  possibility of an open procedure were fully explained to the patient.  She will call our office to schedule the surgery should she desire.

## 2020-04-05 ENCOUNTER — Encounter: Payer: Self-pay | Admitting: Internal Medicine

## 2020-04-13 ENCOUNTER — Telehealth: Payer: Self-pay | Admitting: Internal Medicine

## 2020-04-13 NOTE — Telephone Encounter (Signed)
PATIENT CALLED AND SAID THAT HER PANTOPROZOLE NEEDS PRIOR AUTH

## 2020-04-14 NOTE — H&P (Signed)
Eileen Green; [id number]; 08-Jun-1950   HPI Patient is a 69 year old white female who was referred to my care by Dr. Gala Romney for evaluation and treatment of cholelithiasis.  I saw the patient back in July 2021 for cholelithiasis.  At that time, she was the most part asymptomatic from her cholelithiasis.  She recently was seen in the emergency room in January 2022 for right upper quadrant and epigastric abdominal pain and nausea.  This subsequently resolved by the time she was seen in the emergency room.  She was seen by Dr. Gala Romney who performed a HIDA scan.  HIDA scan revealed a patent cystic duct, but morphine had to be given during the test, and an ejection fraction could not be calculated.  Patient states that she still has intermittent episodes of epigastric and right upper quadrant abdominal pain and bloating.  She denies any fever, chills, or jaundice. Past Medical History:  Diagnosis Date  . Arthritis   . Cancer ([place])    skin melanoma under left eye  . Deaf    LEFT EAR  . GERD (gastroesophageal reflux disease)   . Hyperlipidemia Deaf in Left ear  . Hypertension   . Hypothyroidism     Past Surgical History:  Procedure Laterality Date  . ABDOMINAL HYSTERECTOMY  2001   Partial  . COLONOSCOPY  01/21/2003   [id number] rectum/ Long capacious colon, moderate prep made the exam more challenging.  Marland Kitchen COLONOSCOPY  05/18/2009   Dr. Gala Romney: Normal rectum/narrow mouth left sided diverticula   . COLONOSCOPY N/A 05/14/2013   Dr. Gala Romney: hemorrhoids, colonic diverticula, redudant colon  . ESOPHAGOGASTRODUODENOSCOPY  01/21/2003   RMR:  Normal esophagus, stomach, D1 and D2  . ESOPHAGOGASTRODUODENOSCOPY N/A 03/24/2014   Dr. Gala Romney :Hiatal hernia; otherwise negative EGD  . ESOPHAGOGASTRODUODENOSCOPY N/A 06/27/2019   Procedure: ESOPHAGOGASTRODUODENOSCOPY (EGD);  Surgeon: Daneil Dolin, MD;  [facility];  Service: Endoscopy;  Laterality: N/A;  7:30am  . TONSILLECTOMY    . TOTAL HIP ARTHROPLASTY  Right 05/02/2019   Procedure: RIGHT TOTAL HIP ARTHROPLASTY ANTERIOR APPROACH;  Surgeon: Mcarthur Rossetti, MD;  Location: [facility];  Service: Orthopedics;  Laterality: Right;    Family History  Problem Relation Age of Onset  . Heart disease Mother 99  . Dementia Mother   . Kidney disease Father   . Thyroid nodules Father   . Colon cancer Neg Hx     Current Outpatient Medications on File Prior to Visit  Medication Sig Dispense Refill  . Bacillus Coagulans-Inulin (PROBIOTIC-PREBIOTIC PO) Take 1 capsule by mouth in the morning. Digestive Advantage    . Carboxymethylcellulose Sodium 0.25 % SOLN Apply 1 drop to eye daily as needed (dry eyes).     . Cholecalciferol (VITAMIN D3 PO) Take 2,000 Units by mouth daily.    Marland Kitchen ezetimibe (ZETIA) 10 MG tablet TAKE 1 TABLET(10 MG) BY MOUTH DAILY 30 tablet 11  . famotidine (PEPCID) 20 MG tablet TAKE 1 TABLET(20 MG) BY MOUTH AT BEDTIME 30 tablet 5  . Glucosamine-Chondroitin (OSTEO BI-FLEX REGULAR STRENGTH PO) Take 1 tablet by mouth daily.     . Methylcellulose, Laxative, 500 MG TABS Take 1,000 mg by mouth daily.     . Multiple Vitamins-Minerals (ONE DAILY WOMENS 50 PLUS PO) Take 0.5 tablets by mouth See admin instructions. Mon, Wed Fri and Sat  Centrum Silver 50+    . pantoprazole (PROTONIX) 40 MG tablet TAKE 1 TABLET(40 MG) BY MOUTH TWICE DAILY BEFORE A MEAL 60 tablet 5  . polyethylene glycol (  MIRALAX / GLYCOLAX) packet Take 17 g by mouth daily.    . rosuvastatin (CRESTOR) 5 MG tablet Take 1 tablet (5 mg total) by mouth once a week. Sunday 12 tablet 3  . SYNTHROID 50 MCG tablet Take 1 tablet (50 mcg total) by mouth daily. (Patient taking differently: Take 50 mcg by mouth daily before breakfast.) 30 tablet 11  . valsartan (DIOVAN) 320 MG tablet Take 320 mg by mouth at bedtime.      No current facility-administered medications on file prior to visit.    No Known Allergies  Social History   Substance and Sexual Activity  Alcohol Use No     Social History   Tobacco Use  Smoking Status Never Smoker  Smokeless Tobacco Never Used  Tobacco Comment   Never smoked    Review of Systems  Constitutional: Negative.   HENT: Negative.   Eyes: Negative.   Respiratory: Negative.   Cardiovascular: Negative.   Gastrointestinal: Positive for abdominal pain and heartburn.  Genitourinary: Negative.   Musculoskeletal: Positive for joint pain and neck pain.  Skin: Negative.   Neurological: Negative.   Endo/Heme/Allergies: Negative.   Psychiatric/Behavioral: Negative.     Objective   Vitals:   03/25/20 1135  BP: 122/79  Pulse: 69  Resp: 14  Temp: 98.2 F (36.8 C)  SpO2: 96%    Physical Exam Vitals reviewed.  Constitutional:      Appearance: Normal appearance. She is not ill-appearing.  HENT:     Head: Normocephalic and atraumatic.  Cardiovascular:     Rate and Rhythm: Normal rate and regular rhythm.     Heart sounds: Normal heart sounds. No murmur heard. No friction rub. No gallop.   Pulmonary:     Effort: Pulmonary effort is normal. No respiratory distress.     Breath sounds: Normal breath sounds. No stridor. No wheezing, rhonchi or rales.  Abdominal:     General: Bowel sounds are normal. There is no distension.     Palpations: Abdomen is soft. There is no mass.     Tenderness: There is abdominal tenderness. There is no guarding or rebound.     Hernia: No hernia is present.     Comments: Slight discomfort in the right upper quadrant to palpation.  No rigidity is noted.  Skin:    General: Skin is warm and dry.  Neurological:     Mental Status: She is alert and oriented to person, place, and time.   Previous notes reviewed  Assessment  Biliary colic, cholelithiasis Plan   At this point, I did recommend a laparoscopic cholecystectomy.  Patient is hesitant and would like to think about the surgery.  The risks and benefits of the procedure including bleeding, infection, hepatobiliary injury, and the  possibility of an open procedure were fully explained to the patient.  She will call our office to schedule the surgery should she desire.

## 2020-04-14 NOTE — Patient Instructions (Signed)
Eileen Green  04/14/2020     @PREFPERIOPPHARMACY @   Your procedure is scheduled on  04/21/2020 .  Report to [facility] at  [phone]  A.M.   Call this number if you have problems the morning of surgery:  [phone]   Remember:  Do not eat or drink after midnight.                      Take these medicines the morning of surgery with A SIP OF WATER  Pepcid, pantoprazole, synthroid.    Please brush your teeth.  Do not wear jewelry, make-up or nail polish.  Do not wear lotions, powders, or perfumes, or deodorant.  Do not shave 48 hours prior to surgery.  Men may shave face and neck.  Do not bring valuables to the hospital.  [facility] is not responsible for any belongings or valuables.  Contacts, dentures or bridgework may not be worn into surgery.  Leave your suitcase in the car.  After surgery it may be brought to your room.  For patients admitted to the hospital, discharge time will be determined by your treatment team.  Patients discharged the day of surgery will not be allowed to drive home and must have someone with them for 24 hours.  Place clean sheets on your bed the night before your procedure and DO NOT sleep with pets this night.  Shower with CHG the night before and the morning of your procedure. DO NOT put CHG on your face, hair or genitals.  After each shower, dry off with a clean towel, put on clean, comfortable clothes and brush your teeth.     Special instructions:  DO NOT smoke tobacco or vape 24 hours before your procedure.    Please read over the following fact sheets that you were given. Coughing and Deep Breathing, Surgical Site Infection Prevention, Anesthesia Post-op Instructions and Care and Recovery After Surgery       Minimally Invasive Cholecystectomy, Care After This sheet gives you information about how to care for yourself after your procedure. Your health care provider may also give you more specific instructions. If you have  problems or questions, contact your health care provider. What can I expect after the procedure? After the procedure, it is common to have:  Pain at your incision sites. You will be given medicines to control this pain.  Mild nausea or vomiting.  Bloating and possible shoulder pain from the gas that was used during the procedure. Follow these instructions at home: Medicines  Take over-the-counter and prescription medicines only as told by your health care provider.  If you were prescribed an antibiotic medicine, take or use it as told by your health care provider. Do not stop using the antibiotic even if you start to feel better.  Ask your health care provider if the medicine prescribed to you: ? Requires you to avoid driving or using machinery. ? Can cause constipation. You may need to take these actions to prevent or treat constipation:  Drink enough fluid to keep your urine pale yellow.  Take over-the-counter or prescription medicines.  Eat foods that are high in fiber, such as beans, whole grains, and fresh fruits and vegetables.  Limit foods that are high in fat and processed sugars, such as fried or sweet foods. Incision care  Follow instructions from your health care provider about how to take care of your incisions. Make sure you: ? Wash your  hands with soap and water for at least 20 seconds before and after you change your bandage (dressing). If soap and water are not available, use hand sanitizer. ? Change your dressing as told by your health care provider. ? Leave stitches (sutures), skin glue, or adhesive strips in place. These skin closures may need to be in place for 2 weeks or longer. If adhesive strip edges start to loosen and curl up, you may trim the loose edges. Do not remove adhesive strips completely unless your health care provider tells you to do that.  Do not take baths, swim, or use a hot tub until your health care provider approves. Ask your health care  provider if you may take showers. You may only be allowed to take sponge baths.  Check your incision area every day for signs of infection. Check for: ? More redness, swelling, or pain. ? Fluid or blood. ? Warmth. ? Pus or a bad smell.   Activity  Rest as told by your health care provider.  Avoid sitting for a long time without moving. Get up to take short walks every 1-2 hours. This is important to improve blood flow and breathing. Ask for help if you feel weak or unsteady.  Do not lift anything that is heavier than 10 lb (4.5 kg), or the limit that you are told, until your health care provider says that it is safe.  Do not play contact sports until your health care provider approves.  Do not return to work or school until your health care provider approves.  Return to your normal activities as told by your health care provider. Ask your health care provider what activities are safe for you. General instructions  If you were given a sedative during the procedure, it can affect you for several hours. Do not drive or operate machinery until your health care provider says that it is safe.  Keep all follow-up visits as told by your health care provider. This is important. Contact a health care provider if:  You develop a rash.  You have more redness, swelling, or pain around your incisions.  You have fluid or blood coming from your incisions.  Your incisions feel warm to the touch.  You have pus or a bad smell coming from your incisions.  You have a fever.  One or more of your incisions breaks open. Get help right away if:  You have trouble breathing.  You have chest pain.  You have increasing pain in your shoulders.  You faint or feel dizzy when you stand.  You have severe pain in your abdomen.  You have nausea or vomiting that lasts for more than one day.  You have leg pain. Summary  After your procedure, it is common to have pain at the incision sites. You may  also have nausea or bloating.  Follow your health care provider's instructions about medicine, activity restrictions, and caring for your incision areas. Do not do activities that require a lot of effort.  Contact a health care provider if you have a fever or other signs of infection, such as more redness, swelling, or pain around the incisions.  Get help right away if you have chest pain, increasing pain in the shoulders, or trouble breathing. This information is not intended to replace advice given to you by your health care provider. Make sure you discuss any questions you have with your health care provider. Document Revised: 09/18/2018 Document Reviewed: 09/18/2018 Elsevier Patient Education  2021  [place] Anesthesia, Adult, Care After This sheet gives you information about how to care for yourself after your procedure. Your health care provider may also give you more specific instructions. If you have problems or questions, contact your health care provider. What can I expect after the procedure? After the procedure, the following side effects are common:  Pain or discomfort at the IV site.  Nausea.  Vomiting.  Sore throat.  Trouble concentrating.  Feeling cold or chills.  Feeling weak or tired.  Sleepiness and fatigue.  Soreness and body aches. These side effects can affect parts of the body that were not involved in surgery. Follow these instructions at home: For the time period you were told by your health care provider:  Rest.  Do not participate in activities where you could fall or become injured.  Do not drive or use machinery.  Do not drink alcohol.  Do not take sleeping pills or medicines that cause drowsiness.  Do not make important decisions or sign legal documents.  Do not take care of children on your own.   Eating and drinking  Follow any instructions from your health care provider about eating or drinking restrictions.  When you  feel hungry, start by eating small amounts of foods that are soft and easy to digest (bland), such as toast. Gradually return to your regular diet.  Drink enough fluid to keep your urine pale yellow.  If you vomit, rehydrate by drinking water, juice, or clear broth. General instructions  If you have sleep apnea, surgery and certain medicines can increase your risk for breathing problems. Follow instructions from your health care provider about wearing your sleep device: ? Anytime you are sleeping, including during daytime naps. ? While taking prescription pain medicines, sleeping medicines, or medicines that make you drowsy.  Have a responsible adult stay with you for the time you are told. It is important to have someone help care for you until you are awake and alert.  Return to your normal activities as told by your health care provider. Ask your health care provider what activities are safe for you.  Take over-the-counter and prescription medicines only as told by your health care provider.  If you smoke, do not smoke without supervision.  Keep all follow-up visits as told by your health care provider. This is important. Contact a health care provider if:  You have nausea or vomiting that does not get better with medicine.  You cannot eat or drink without vomiting.  You have pain that does not get better with medicine.  You are unable to pass urine.  You develop a skin rash.  You have a fever.  You have redness around your IV site that gets worse. Get help right away if:  You have difficulty breathing.  You have chest pain.  You have blood in your urine or stool, or you vomit blood. Summary  After the procedure, it is common to have a sore throat or nausea. It is also common to feel tired.  Have a responsible adult stay with you for the time you are told. It is important to have someone help care for you until you are awake and alert.  When you feel hungry, start  by eating small amounts of foods that are soft and easy to digest (bland), such as toast. Gradually return to your regular diet.  Drink enough fluid to keep your urine pale yellow.  Return to your normal activities as told by your  health care provider. Ask your health care provider what activities are safe for you. This information is not intended to replace advice given to you by your health care provider. Make sure you discuss any questions you have with your health care provider. Document Revised: 09/04/2019 Document Reviewed: 04/03/2019 Elsevier Patient Education  2021 [employer].

## 2020-04-15 NOTE — Telephone Encounter (Signed)
Spoke with pt. PA was submitted via [email or web address]. PA was approved. Approval letter will be scanned in chart when received.

## 2020-04-19 ENCOUNTER — Other Ambulatory Visit (HOSPITAL_COMMUNITY): Payer: Federal, State, Local not specified - PPO

## 2020-04-20 ENCOUNTER — Other Ambulatory Visit: Payer: Self-pay

## 2020-04-20 ENCOUNTER — Encounter (HOSPITAL_COMMUNITY): Payer: Self-pay

## 2020-04-20 ENCOUNTER — Encounter (HOSPITAL_COMMUNITY)
Admission: RE | Admit: 2020-04-20 | Discharge: 2020-04-20 | Disposition: A | Discharge status: Home / Self Care | Payer: Federal, State, Local not specified - PPO | Source: Ambulatory Visit | Attending: General Surgery | Admitting: General Surgery

## 2020-04-20 ENCOUNTER — Other Ambulatory Visit (HOSPITAL_COMMUNITY)
Admission: RE | Admit: 2020-04-20 | Discharge: 2020-04-20 | Disposition: A | Discharge status: Home / Self Care | Payer: Federal, State, Local not specified - PPO | Source: Ambulatory Visit | Attending: General Surgery | Admitting: General Surgery

## 2020-04-20 DIAGNOSIS — Z20822 Contact with and (suspected) exposure to covid-19: Secondary | ICD-10-CM | POA: Insufficient documentation

## 2020-04-20 DIAGNOSIS — I10 Essential (primary) hypertension: Secondary | ICD-10-CM | POA: Diagnosis not present

## 2020-04-20 DIAGNOSIS — Z85820 Personal history of malignant melanoma of skin: Secondary | ICD-10-CM | POA: Diagnosis not present

## 2020-04-20 DIAGNOSIS — Z8719 Personal history of other diseases of the digestive system: Secondary | ICD-10-CM | POA: Diagnosis not present

## 2020-04-20 DIAGNOSIS — Z01812 Encounter for preprocedural laboratory examination: Secondary | ICD-10-CM | POA: Insufficient documentation

## 2020-04-20 DIAGNOSIS — E785 Hyperlipidemia, unspecified: Secondary | ICD-10-CM | POA: Diagnosis not present

## 2020-04-20 DIAGNOSIS — E039 Hypothyroidism, unspecified: Secondary | ICD-10-CM | POA: Diagnosis not present

## 2020-04-20 DIAGNOSIS — Z79890 Hormone replacement therapy (postmenopausal): Secondary | ICD-10-CM | POA: Diagnosis not present

## 2020-04-20 DIAGNOSIS — Z96641 Presence of right artificial hip joint: Secondary | ICD-10-CM | POA: Diagnosis not present

## 2020-04-20 DIAGNOSIS — K8064 Calculus of gallbladder and bile duct with chronic cholecystitis without obstruction: Secondary | ICD-10-CM | POA: Diagnosis present

## 2020-04-20 DIAGNOSIS — Z8249 Family history of ischemic heart disease and other diseases of the circulatory system: Secondary | ICD-10-CM | POA: Diagnosis not present

## 2020-04-20 DIAGNOSIS — Z79899 Other long term (current) drug therapy: Secondary | ICD-10-CM | POA: Diagnosis not present

## 2020-04-20 DIAGNOSIS — Z90711 Acquired absence of uterus with remaining cervical stump: Secondary | ICD-10-CM | POA: Diagnosis not present

## 2020-04-20 LAB — COMPREHENSIVE METABOLIC PANEL
ALT: 21 U/L (ref 0–44)
AST: 21 U/L (ref 15–41)
Albumin: 3.9 g/dL (ref 3.5–5.0)
Alkaline Phosphatase: 99 U/L (ref 38–126)
Anion gap: 8 (ref 5–15)
BUN: 14 mg/dL (ref 8–23)
CO2: 21 mmol/L — ABNORMAL LOW (ref 22–32)
Calcium: 10.3 mg/dL (ref 8.9–10.3)
Chloride: 110 mmol/L (ref 98–111)
Creatinine, Ser: 0.83 mg/dL (ref 0.44–1.00)
GFR, Estimated: >60 mL/min (ref >60)
Glucose, Bld: 104 mg/dL — ABNORMAL HIGH (ref 70–99)
Potassium: 4 mmol/L (ref 3.5–5.1)
Sodium: 139 mmol/L (ref 135–145)
Total Bilirubin: 0.5 mg/dL (ref 0.3–1.2)
Total Protein: 6.6 g/dL (ref 6.5–8.1)

## 2020-04-20 LAB — CBC WITH DIFFERENTIAL/PLATELET
Abs Immature Granulocytes: 0.01 10*3/uL (ref 0.00–0.07)
Basophils Absolute: 0.1 10*3/uL (ref 0.0–0.1)
Basophils Relative: 1 %
Eosinophils Absolute: 0.2 10*3/uL (ref 0.0–0.5)
Eosinophils Relative: 3 %
HCT: 41.4 % (ref 36.0–46.0)
Hemoglobin: 13.3 g/dL (ref 12.0–15.0)
Immature Granulocytes: 0 %
Lymphocytes Relative: 21 %
Lymphs Abs: 1.2 10*3/uL (ref 0.7–4.0)
MCH: 29.2 pg (ref 26.0–34.0)
MCHC: 32.1 g/dL (ref 30.0–36.0)
MCV: 90.8 fL (ref 80.0–100.0)
Monocytes Absolute: 0.6 10*3/uL (ref 0.1–1.0)
Monocytes Relative: 10 %
Neutro Abs: 3.8 10*3/uL (ref 1.7–7.7)
Neutrophils Relative %: 65 %
Platelets: 330 10*3/uL (ref 150–400)
RBC: 4.56 MIL/uL (ref 3.87–5.11)
RDW: 13.8 % (ref 11.5–15.5)
WBC: 5.9 10*3/uL (ref 4.0–10.5)
nRBC: 0 % (ref 0.0–0.2)

## 2020-04-20 LAB — SARS CORONAVIRUS 2 (TAT 6-24 HRS): SARS Coronavirus 2: NEGATIVE

## 2020-04-21 ENCOUNTER — Ambulatory Visit (HOSPITAL_COMMUNITY): Payer: Federal, State, Local not specified - PPO | Admitting: Anesthesiology

## 2020-04-21 ENCOUNTER — Ambulatory Visit (HOSPITAL_COMMUNITY)
Admission: RE | Admit: 2020-04-21 | Discharge: 2020-04-21 | Disposition: A | Discharge status: Home / Self Care | Payer: Federal, State, Local not specified - PPO | Source: Ambulatory Visit | Attending: General Surgery | Admitting: General Surgery

## 2020-04-21 ENCOUNTER — Encounter (HOSPITAL_COMMUNITY): Payer: Self-pay | Admitting: General Surgery

## 2020-04-21 ENCOUNTER — Encounter (HOSPITAL_COMMUNITY)
Admission: RE | Disposition: A | Discharge status: Home / Self Care | Payer: Self-pay | Source: Ambulatory Visit | Attending: General Surgery

## 2020-04-21 DIAGNOSIS — K8020 Calculus of gallbladder without cholecystitis without obstruction: Secondary | ICD-10-CM

## 2020-04-21 DIAGNOSIS — E039 Hypothyroidism, unspecified: Secondary | ICD-10-CM | POA: Insufficient documentation

## 2020-04-21 DIAGNOSIS — Z20822 Contact with and (suspected) exposure to covid-19: Secondary | ICD-10-CM | POA: Insufficient documentation

## 2020-04-21 DIAGNOSIS — Z79899 Other long term (current) drug therapy: Secondary | ICD-10-CM | POA: Insufficient documentation

## 2020-04-21 DIAGNOSIS — K8064 Calculus of gallbladder and bile duct with chronic cholecystitis without obstruction: Secondary | ICD-10-CM | POA: Insufficient documentation

## 2020-04-21 DIAGNOSIS — I10 Essential (primary) hypertension: Secondary | ICD-10-CM | POA: Insufficient documentation

## 2020-04-21 DIAGNOSIS — Z8719 Personal history of other diseases of the digestive system: Secondary | ICD-10-CM | POA: Insufficient documentation

## 2020-04-21 DIAGNOSIS — Z79890 Hormone replacement therapy (postmenopausal): Secondary | ICD-10-CM | POA: Insufficient documentation

## 2020-04-21 DIAGNOSIS — Z96641 Presence of right artificial hip joint: Secondary | ICD-10-CM | POA: Insufficient documentation

## 2020-04-21 DIAGNOSIS — E785 Hyperlipidemia, unspecified: Secondary | ICD-10-CM | POA: Insufficient documentation

## 2020-04-21 DIAGNOSIS — Z90711 Acquired absence of uterus with remaining cervical stump: Secondary | ICD-10-CM | POA: Insufficient documentation

## 2020-04-21 DIAGNOSIS — Z8249 Family history of ischemic heart disease and other diseases of the circulatory system: Secondary | ICD-10-CM | POA: Insufficient documentation

## 2020-04-21 DIAGNOSIS — Z85820 Personal history of malignant melanoma of skin: Secondary | ICD-10-CM | POA: Insufficient documentation

## 2020-04-21 HISTORY — PX: CHOLECYSTECTOMY: SHX55

## 2020-04-21 SURGERY — LAPAROSCOPIC CHOLECYSTECTOMY
Anesthesia: General | Site: Abdomen

## 2020-04-21 MED ORDER — FENTANYL CITRATE (PF) 100 MCG/2ML IJ SOLN
INTRAMUSCULAR | Status: AC
  Filled 2020-04-21: qty 2

## 2020-04-21 MED ORDER — FENTANYL CITRATE (PF) 100 MCG/2ML IJ SOLN
INTRAMUSCULAR | Status: DC | PRN
  Administered 2020-04-21: 100 ug via INTRAVENOUS

## 2020-04-21 MED ORDER — HEMOSTATIC AGENTS (NO CHARGE) OPTIME
TOPICAL | Status: DC | PRN
  Administered 2020-04-21: 1 via TOPICAL

## 2020-04-21 MED ORDER — LACTATED RINGERS IV SOLN
INTRAVENOUS | Status: DC
  Administered 2020-04-21: 1000 mL via INTRAVENOUS

## 2020-04-21 MED ORDER — PROPOFOL 10 MG/ML IV BOLUS
INTRAVENOUS | Status: DC | PRN
  Administered 2020-04-21: 200 mg via INTRAVENOUS

## 2020-04-21 MED ORDER — HYDROCODONE-ACETAMINOPHEN 5-325 MG PO TABS: 1.0000 | ORAL_TABLET | ORAL | 0 refills | Status: DC | PRN

## 2020-04-21 MED ORDER — CHLORHEXIDINE GLUCONATE CLOTH 2 % EX PADS: 6.0000 | MEDICATED_PAD | Freq: Once | CUTANEOUS | Status: DC

## 2020-04-21 MED ORDER — CEFAZOLIN SODIUM-DEXTROSE 2-4 GM/100ML-% IV SOLN
2.0000 g | INTRAVENOUS | Status: AC
  Administered 2020-04-21: 2 g via INTRAVENOUS

## 2020-04-21 MED ORDER — MIDAZOLAM HCL 5 MG/5ML IJ SOLN
INTRAMUSCULAR | Status: DC | PRN
  Administered 2020-04-21: 2 mg via INTRAVENOUS

## 2020-04-21 MED ORDER — ORAL CARE MOUTH RINSE: 15.0000 mL | Freq: Once | OROMUCOSAL | Status: AC

## 2020-04-21 MED ORDER — ONDANSETRON HCL 4 MG/2ML IJ SOLN: 4.0000 mg | Freq: Once | INTRAMUSCULAR | Status: DC | PRN

## 2020-04-21 MED ORDER — DEXAMETHASONE SODIUM PHOSPHATE 10 MG/ML IJ SOLN
INTRAMUSCULAR | Status: AC
  Filled 2020-04-21: qty 1

## 2020-04-21 MED ORDER — BUPIVACAINE LIPOSOME 1.3 % IJ SUSP
INTRAMUSCULAR | Status: DC | PRN
  Administered 2020-04-21: 20 mL

## 2020-04-21 MED ORDER — SCOPOLAMINE 1 MG/3DAYS TD PT72
MEDICATED_PATCH | TRANSDERMAL | Status: AC
  Filled 2020-04-21: qty 1

## 2020-04-21 MED ORDER — ROCURONIUM BROMIDE 10 MG/ML (PF) SYRINGE
PREFILLED_SYRINGE | INTRAVENOUS | Status: AC
  Filled 2020-04-21: qty 10

## 2020-04-21 MED ORDER — PROPOFOL 10 MG/ML IV BOLUS
INTRAVENOUS | Status: AC
  Filled 2020-04-21: qty 20

## 2020-04-21 MED ORDER — SODIUM CHLORIDE 0.9 % IR SOLN
Status: DC | PRN
  Administered 2020-04-21: 1000 mL

## 2020-04-21 MED ORDER — CHLORHEXIDINE GLUCONATE 0.12 % MT SOLN
15.0000 mL | Freq: Once | OROMUCOSAL | Status: AC
  Administered 2020-04-21: 15 mL via OROMUCOSAL

## 2020-04-21 MED ORDER — ROCURONIUM BROMIDE 100 MG/10ML IV SOLN
INTRAVENOUS | Status: DC | PRN
  Administered 2020-04-21: 50 mg via INTRAVENOUS

## 2020-04-21 MED ORDER — KETOROLAC TROMETHAMINE 30 MG/ML IJ SOLN
15.0000 mg | Freq: Once | INTRAMUSCULAR | Status: AC
  Administered 2020-04-21: 15 mg via INTRAVENOUS
  Filled 2020-04-21: qty 1

## 2020-04-21 MED ORDER — FENTANYL CITRATE (PF) 250 MCG/5ML IJ SOLN
INTRAMUSCULAR | Status: AC
  Filled 2020-04-21: qty 5

## 2020-04-21 MED ORDER — CEFAZOLIN SODIUM-DEXTROSE 2-4 GM/100ML-% IV SOLN
INTRAVENOUS | Status: AC
  Filled 2020-04-21: qty 100

## 2020-04-21 MED ORDER — HYDROMORPHONE HCL 1 MG/ML IJ SOLN
0.2500 mg | INTRAMUSCULAR | Status: DC | PRN
  Administered 2020-04-21: 0.5 mg via INTRAVENOUS
  Filled 2020-04-21: qty 0.5

## 2020-04-21 MED ORDER — LIDOCAINE HCL (CARDIAC) PF 100 MG/5ML IV SOSY
PREFILLED_SYRINGE | INTRAVENOUS | Status: DC | PRN
  Administered 2020-04-21: 50 mg via INTRAVENOUS

## 2020-04-21 MED ORDER — SUGAMMADEX SODIUM 500 MG/5ML IV SOLN
INTRAVENOUS | Status: DC | PRN
  Administered 2020-04-21: 200 mg via INTRAVENOUS

## 2020-04-21 MED ORDER — DEXAMETHASONE SODIUM PHOSPHATE 10 MG/ML IJ SOLN
INTRAMUSCULAR | Status: DC | PRN
  Administered 2020-04-21: 10 mg via INTRAVENOUS

## 2020-04-21 MED ORDER — BUPIVACAINE LIPOSOME 1.3 % IJ SUSP
INTRAMUSCULAR | Status: AC
  Filled 2020-04-21: qty 20

## 2020-04-21 MED ORDER — SCOPOLAMINE 1 MG/3DAYS TD PT72
1.0000 | MEDICATED_PATCH | Freq: Once | TRANSDERMAL | Status: DC
  Administered 2020-04-21: 1.5 mg via TRANSDERMAL

## 2020-04-21 MED ORDER — ONDANSETRON HCL 4 MG/2ML IJ SOLN
INTRAMUSCULAR | Status: DC | PRN
  Administered 2020-04-21: 4 mg via INTRAVENOUS

## 2020-04-21 MED ORDER — CHLORHEXIDINE GLUCONATE 0.12 % MT SOLN
OROMUCOSAL | Status: AC
  Filled 2020-04-21: qty 15

## 2020-04-21 MED ORDER — MIDAZOLAM HCL 2 MG/2ML IJ SOLN
INTRAMUSCULAR | Status: AC
  Filled 2020-04-21: qty 2

## 2020-04-21 MED ORDER — ONDANSETRON HCL 4 MG/2ML IJ SOLN
INTRAMUSCULAR | Status: AC
  Filled 2020-04-21: qty 2

## 2020-04-21 SURGICAL SUPPLY — 42 items
ADH SKN CLS APL DERMABOND .7 (GAUZE/BANDAGES/DRESSINGS) ×1
APL SRG 38 LTWT LNG FL B (MISCELLANEOUS)
APPLICATOR ARISTA FLEXITIP XL (MISCELLANEOUS) IMPLANT
APPLIER CLIP ROT 10 11.4 M/L (STAPLE) ×2
APR CLP MED LRG 11.4X10 (STAPLE) ×1
BAG RETRIEVAL 10 (BASKET) ×1
CLIP APPLIE ROT 10 11.4 M/L (STAPLE) ×1 IMPLANT
CLOTH BEACON ORANGE TIMEOUT ST (SAFETY) ×2 IMPLANT
COVER LIGHT HANDLE STERIS (MISCELLANEOUS) ×4 IMPLANT
COVER WAND RF STERILE (DRAPES) ×2 IMPLANT
DERMABOND ADVANCED (GAUZE/BANDAGES/DRESSINGS) ×1
DERMABOND ADVANCED .7 DNX12 (GAUZE/BANDAGES/DRESSINGS) ×1 IMPLANT
DURAPREP 26ML APPLICATOR (WOUND CARE) ×2 IMPLANT
ELECT REM PT RETURN 9FT ADLT (ELECTROSURGICAL) ×2
ELECTRODE REM PT RTRN 9FT ADLT (ELECTROSURGICAL) ×1 IMPLANT
GLOVE SURG SS PI 7.5 STRL IVOR (GLOVE) ×2 IMPLANT
GLOVE SURG UNDER POLY LF SZ7 (GLOVE) ×6 IMPLANT
GOWN STRL REUS W/TWL LRG LVL3 (GOWN DISPOSABLE) ×8 IMPLANT
HEMOSTAT ARISTA ABSORB 3G PWDR (HEMOSTASIS) IMPLANT
HEMOSTAT SNOW SURGICEL 2X4 (HEMOSTASIS) ×2 IMPLANT
INST SET LAPROSCOPIC AP (KITS) ×2 IMPLANT
KIT TURNOVER KIT A (KITS) ×2 IMPLANT
MANIFOLD NEPTUNE II (INSTRUMENTS) ×2 IMPLANT
NEEDLE HYPO 18GX1.5 BLUNT FILL (NEEDLE) ×2 IMPLANT
NEEDLE HYPO 21X1.5 SAFETY (NEEDLE) ×2 IMPLANT
NEEDLE INSUFFLATION 14GA 120MM (NEEDLE) ×2 IMPLANT
NS IRRIG 1000ML POUR BTL (IV SOLUTION) ×2 IMPLANT
PACK LAP CHOLE LZT030E (CUSTOM PROCEDURE TRAY) ×2 IMPLANT
PAD ARMBOARD 7.5X6 YLW CONV (MISCELLANEOUS) ×2 IMPLANT
SET BASIN LINEN APH (SET/KITS/TRAYS/PACK) ×2 IMPLANT
SET TUBE SMOKE EVAC HIGH FLOW (TUBING) ×2 IMPLANT
SLEEVE ENDOPATH XCEL 5M (ENDOMECHANICALS) ×2 IMPLANT
SUT MNCRL AB 4-0 PS2 18 (SUTURE) ×4 IMPLANT
SUT VICRYL 0 UR6 27IN ABS (SUTURE) ×4 IMPLANT
SYR 20ML LL LF (SYRINGE) ×4 IMPLANT
SYS BAG RETRIEVAL 10MM (BASKET) ×1
SYSTEM BAG RETRIEVAL 10MM (BASKET) ×1 IMPLANT
TROCAR ENDO BLADELESS 11MM (ENDOMECHANICALS) ×2 IMPLANT
TROCAR XCEL NON-BLD 5MMX100MML (ENDOMECHANICALS) ×2 IMPLANT
TROCAR XCEL UNIV SLVE 11M 100M (ENDOMECHANICALS) ×2 IMPLANT
TUBE CONNECTING 12X1/4 (SUCTIONS) ×2 IMPLANT
WARMER LAPAROSCOPE (MISCELLANEOUS) ×2 IMPLANT

## 2020-04-21 NOTE — Transfer of Care (Signed)
Immediate Anesthesia Transfer of Care Note  Patient: Eileen Green  Procedure(s) Performed: LAPAROSCOPIC CHOLECYSTECTOMY (N/A Abdomen)  Patient Location: PACU  Anesthesia Type:General  Level of Consciousness: awake, alert , oriented and patient cooperative  Airway & Oxygen Therapy: Patient Spontanous Breathing and Patient connected to face mask oxygen  Post-op Assessment: Report given to RN, Post -op Vital signs reviewed and stable and Patient moving all extremities X 4  Post vital signs: Reviewed and stable  Last Vitals:  Vitals Value Taken Time  BP 147/85 04/21/20 0831  Temp    Pulse 87 04/21/20 0831  Resp 15 04/21/20 0831  SpO2 100 % 04/21/20 0831  Vitals shown include unvalidated device data.  Last Pain:  Vitals:   04/21/20 0651  TempSrc: Oral  PainSc: 0-No pain      Patients Stated Pain Goal: 3 (78/67/67 2094)  Complications: No complications documented.

## 2020-04-21 NOTE — Discharge Instructions (Signed)
Minimally Invasive Cholecystectomy, Care After This sheet gives you information about how to care for yourself after your procedure. Your health care provider may also give you more specific instructions. If you have problems or questions, contact your health care provider. What can I expect after the procedure? After the procedure, it is common to have:  Pain at your incision sites. You will be given medicines to control this pain.  Mild nausea or vomiting.  Bloating and possible shoulder pain from the gas that was used during the procedure. Follow these instructions at home: Medicines  Take over-the-counter and prescription medicines only as told by your health care provider.  If you were prescribed an antibiotic medicine, take or use it as told by your health care provider. Do not stop using the antibiotic even if you start to feel better.  Ask your health care provider if the medicine prescribed to you: ? Requires you to avoid driving or using machinery. ? Can cause constipation. You may need to take these actions to prevent or treat constipation:  Drink enough fluid to keep your urine pale yellow.  Take over-the-counter or prescription medicines.  Eat foods that are high in fiber, such as beans, whole grains, and fresh fruits and vegetables.  Limit foods that are high in fat and processed sugars, such as fried or sweet foods. Incision care  Follow instructions from your health care provider about how to take care of your incisions. Make sure you: ? Wash your hands with soap and water for at least 20 seconds before and after you change your bandage (dressing). If soap and water are not available, use hand sanitizer. ? Change your dressing as told by your health care provider. ? Leave stitches (sutures), skin glue, or adhesive strips in place. These skin closures may need to be in place for 2 weeks or longer. If adhesive strip edges start to loosen and curl up, you may trim the  loose edges. Do not remove adhesive strips completely unless your health care provider tells you to do that.  Do not take baths, swim, or use a hot tub until your health care provider approves. Ask your health care provider if you may take showers. You may only be allowed to take sponge baths.  Check your incision area every day for signs of infection. Check for: ? More redness, swelling, or pain. ? Fluid or blood. ? Warmth. ? Pus or a bad smell.   Activity  Rest as told by your health care provider.  Avoid sitting for a long time without moving. Get up to take short walks every 1-2 hours. This is important to improve blood flow and breathing. Ask for help if you feel weak or unsteady.  Do not lift anything that is heavier than 10 lb (4.5 kg), or the limit that you are told, until your health care provider says that it is safe.  Do not play contact sports until your health care provider approves.  Do not return to work or school until your health care provider approves.  Return to your normal activities as told by your health care provider. Ask your health care provider what activities are safe for you. General instructions  If you were given a sedative during the procedure, it can affect you for several hours. Do not drive or operate machinery until your health care provider says that it is safe.  Keep all follow-up visits as told by your health care provider. This is important. Contact a health  care provider if:  You develop a rash.  You have more redness, swelling, or pain around your incisions.  You have fluid or blood coming from your incisions.  Your incisions feel warm to the touch.  You have pus or a bad smell coming from your incisions.  You have a fever.  One or more of your incisions breaks open. Get help right away if:  You have trouble breathing.  You have chest pain.  You have increasing pain in your shoulders.  You faint or feel dizzy when you  stand.  You have severe pain in your abdomen.  You have nausea or vomiting that lasts for more than one day.  You have leg pain. Summary  After your procedure, it is common to have pain at the incision sites. You may also have nausea or bloating.  Follow your health care provider's instructions about medicine, activity restrictions, and caring for your incision areas. Do not do activities that require a lot of effort.  Contact a health care provider if you have a fever or other signs of infection, such as more redness, swelling, or pain around the incisions.  Get help right away if you have chest pain, increasing pain in the shoulders, or trouble breathing. This information is not intended to replace advice given to you by your health care provider. Make sure you discuss any questions you have with your health care provider. Document Revised: 09/18/2018 Document Reviewed: 09/18/2018 Elsevier Patient Education  2021 [place] Anesthesia, Adult, Care After This sheet gives you information about how to care for yourself after your procedure. Your health care provider may also give you more specific instructions. If you have problems or questions, contact your health care provider. What can I expect after the procedure? After the procedure, the following side effects are common:  Pain or discomfort at the IV site.  Nausea.  Vomiting.  Sore throat.  Trouble concentrating.  Feeling cold or chills.  Feeling weak or tired.  Sleepiness and fatigue.  Soreness and body aches. These side effects can affect parts of the body that were not involved in surgery. Follow these instructions at home: For the time period you were told by your health care provider:  Rest.  Do not participate in activities where you could fall or become injured.  Do not drive or use machinery.  Do not drink alcohol.  Do not take sleeping pills or medicines that cause drowsiness.  Do not  make important decisions or sign legal documents.  Do not take care of children on your own.   Eating and drinking  Follow any instructions from your health care provider about eating or drinking restrictions.  When you feel hungry, start by eating small amounts of foods that are soft and easy to digest (bland), such as toast. Gradually return to your regular diet.  Drink enough fluid to keep your urine pale yellow.  If you vomit, rehydrate by drinking water, juice, or clear broth. General instructions  If you have sleep apnea, surgery and certain medicines can increase your risk for breathing problems. Follow instructions from your health care provider about wearing your sleep device: ? Anytime you are sleeping, including during daytime naps. ? While taking prescription pain medicines, sleeping medicines, or medicines that make you drowsy.  Have a responsible adult stay with you for the time you are told. It is important to have someone help care for you until you are awake and alert.  Return to  your normal activities as told by your health care provider. Ask your health care provider what activities are safe for you.  Take over-the-counter and prescription medicines only as told by your health care provider.  If you smoke, do not smoke without supervision.  Keep all follow-up visits as told by your health care provider. This is important. Contact a health care provider if:  You have nausea or vomiting that does not get better with medicine.  You cannot eat or drink without vomiting.  You have pain that does not get better with medicine.  You are unable to pass urine.  You develop a skin rash.  You have a fever.  You have redness around your IV site that gets worse. Get help right away if:  You have difficulty breathing.  You have chest pain.  You have blood in your urine or stool, or you vomit blood. Summary  After the procedure, it is common to have a sore throat  or nausea. It is also common to feel tired.  Have a responsible adult stay with you for the time you are told. It is important to have someone help care for you until you are awake and alert.  When you feel hungry, start by eating small amounts of foods that are soft and easy to digest (bland), such as toast. Gradually return to your regular diet.  Drink enough fluid to keep your urine pale yellow.  Return to your normal activities as told by your health care provider. Ask your health care provider what activities are safe for you. This information is not intended to replace advice given to you by your health care provider. Make sure you discuss any questions you have with your health care provider. Document Revised: 09/04/2019 Document Reviewed: 04/03/2019 Elsevier Patient Education  2021 [place]. Acetaminophen; Hydrocodone tablets or capsules What is this medicine? ACETAMINOPHEN; HYDROCODONE (a set a MEE noe fen; hye droe KOE done) is a pain reliever. It is used to treat moderate to severe pain. This medicine may be used for other purposes; ask your health care provider or pharmacist if you have questions. COMMON BRAND NAME(S): Anexsia, Bancap HC, Ceta-Plus, Co-Gesic, Comfortpak, Dolagesic, [employer], DuoCet, Hydrocet, Hydrogesic, [place], Lorcet HD, Lorcet Plus, Lortab, Margesic H, Maxidone, [place], Polygesic, [place], [place], [place], Vicodin, Vicodin ES, Vicodin HP, Charlane Ferretti What should I tell my health care provider before I take this medicine? They need to know if you have any of these conditions:  brain tumor  drug abuse or addiction  head injury  heart disease  if you often drink alcohol  kidney disease  liver disease  low adrenal gland function  lung disease, asthma, or breathing problems  seizures  stomach or intestine problems  taken an MAOI like Marplan, Nardil, or Parnate in the last 14 days  an unusual or allergic reaction to acetaminophen,  hydrocodone, other medicines, foods, dyes, or preservatives  pregnant or trying to get pregnant  breast-feeding How should I use this medicine? Take this medicine by mouth with a glass of water. Follow the directions on the prescription label. You can take it with or without food. If it upsets your stomach, take it with food. Do not take your medicine more often than directed. A special MedGuide will be given to you by the pharmacist with each prescription and refill. Be sure to read this information carefully each time. Talk to your pediatrician regarding the use of this medicine in children. Special care may be needed.  Overdosage: If you think you have taken too much of this medicine contact a poison control center or emergency room at once. NOTE: This medicine is only for you. Do not share this medicine with others. What if I miss a dose? If you miss a dose, take it as soon as you can. If it is almost time for your next dose, take only that dose. Do not take double or extra doses. What may interact with this medicine? This medicine may interact with the following medications:  alcohol  antiviral medicines for HIV or AIDS  atropine  antihistamines for allergy, cough and cold  certain antibiotics like erythromycin, clarithromycin  certain medicines for anxiety or sleep  certain medicines for bladder problems like oxybutynin, tolterodine  certain medicines for depression like amitriptyline, fluoxetine, sertraline  certain medicines for fungal infections like ketoconazole and itraconazole  certain medicines for Parkinson's disease like benztropine, trihexyphenidyl  certain medicines for seizures like carbamazepine, phenobarbital, phenytoin, primidone  certain medicines for stomach problems like dicyclomine, hyoscyamine  certain medicines for travel sickness like scopolamine  general anesthetics like halothane, isoflurane, methoxyflurane, propofol  ipratropium  local  anesthetics like lidocaine, pramoxine, tetracaine  MAOIs like Carbex, Eldepryl, Marplan, Nardil, and Parnate  medicines that relax muscles for surgery  other medicines with acetaminophen  other narcotic medicines for pain or cough  phenothiazines like chlorpromazine, mesoridazine, prochlorperazine, thioridazine  rifampin This list may not describe all possible interactions. Give your health care provider a list of all the medicines, herbs, non-prescription drugs, or dietary supplements you use. Also tell them if you smoke, drink alcohol, or use illegal drugs. Some items may interact with your medicine. What should I watch for while using this medicine? Tell your health care provider if your pain does not go away, if it gets worse, or if you have new or a different type of pain. You may develop tolerance to this drug. Tolerance means that you will need a higher dose of the drug for pain relief. Tolerance is normal and is expected if you take this drug for a long time. There are different types of narcotic drugs (opioids) for pain. If you take more than one type at the same time, you may have more side effects. Give your health care provider a list of all drugs you use. He or she will tell you how much drug to take. Do not take more drug than directed. Get emergency help right away if you have problems breathing. Do not suddenly stop taking your drug because you may develop a severe reaction. Your body becomes used to the drug. This does NOT mean you are addicted. Addiction is a behavior related to getting and using a drug for a nonmedical reason. If you have pain, you have a medical reason to take pain drug. Your health care provider will tell you how much drug to take. If your health care provider wants you to stop the drug, the dose will be slowly lowered over time to avoid any side effects. Talk to your health care provider about naloxone and how to get it. Naloxone is an emergency drug used for  an opioid overdose. An overdose can happen if you take too much opioid. It can also happen if an opioid is taken with some other drugs or substances, like alcohol. Know the symptoms of an overdose, like trouble breathing, unusually tired or sleepy, or not being able to respond or wake up. Make sure to tell caregivers and close contacts where it  is stored. Make sure they know how to use it. After naloxone is given, you must get emergency help right away. Naloxone is a temporary treatment. Repeat doses may be needed. Do not take other drugs that contain acetaminophen with this drug. Many non-prescription drugs contain acetaminophen. Always read labels carefully. If you have questions, ask your health care provider. If you take too much acetaminophen, get medical help right away. Too much acetaminophen can be very dangerous and cause liver damage. Even if you do not have symptoms, it is important to get help right away. You may get drowsy or dizzy. Do not drive, use machinery, or do anything that needs mental alertness until you know how this drug affects you. Do not stand up or sit up quickly, especially if you are an older patient. This reduces the risk of dizzy or fainting spells. Alcohol may interfere with the effect of this drug. Avoid alcoholic drinks. This drug will cause constipation. If you do not have a bowel movement for 3 days, call your health care provider. Your mouth may get dry. Chewing sugarless gum or sucking hard candy and drinking plenty of water may help. Contact your health care provider if the problem does not go away or is severe. What side effects may I notice from receiving this medicine? Side effects that you should report to your doctor or health care professional as soon as possible:  allergic reactions like skin rash, itching or hives, swelling of the face, lips, or tongue  breathing problems  confusion  redness, blistering, peeling or loosening of the skin, including inside  the mouth  signs and symptoms of low blood pressure like dizziness; feeling faint or lightheaded, falls; unusually weak or tired  trouble passing urine or change in the amount of urine  yellowing of the eyes or skin Side effects that usually do not require medical attention (report to your doctor or health care professional if they continue or are bothersome):  constipation  dry mouth  nausea, vomiting  tiredness This list may not describe all possible side effects. Call your doctor for medical advice about side effects. You may report side effects to FDA at 1-800-FDA-1088. Where should I keep my medicine? Keep out of the reach of children. This medicine can be abused. Keep your medicine in a safe place to protect it from theft. Do not share this medicine with anyone. Selling or giving away this medicine is dangerous and against the law. Store at room temperature between 15 and 30 degrees C (59 and 86 degrees F). This medicine may cause harm and death if it is taken by other adults, children, or pets. Return medicine that has not been used to an official disposal site. Contact the DEA at 605-300-0559 or your city/county government to find a site. If you cannot return the medicine, flush it down the toilet. Do not use the medicine after the expiration date. NOTE: This sheet is a summary. It may not cover all possible information. If you have questions about this medicine, talk to your doctor, pharmacist, or health care provider.  2021 Elsevier/Gold Standard (2018-07-31 12:25:54) Bupivacaine Liposomal Suspension for Injection What is this medicine? BUPIVACAINE LIPOSOMAL (bue PIV a kane LIP oh som al) is an anesthetic. It causes loss of feeling in the skin or other tissues. It is used to prevent and to treat pain from some procedures. This medicine may be used for other purposes; ask your health care provider or pharmacist if you have questions. COMMON BRAND  NAME(S): EXPAREL What should I  tell my health care provider before I take this medicine? They need to know if you have any of these conditions:  G6PD deficiency  heart disease  kidney disease  liver disease  low blood pressure  lung or breathing disease, like asthma  an unusual or allergic reaction to bupivacaine, other medicines, foods, dyes, or preservatives  pregnant or trying to get pregnant  breast-feeding How should I use this medicine? This medicine is injected into the affected area. It is given by a health care provider in a hospital or clinic setting. Talk to your health care provider about the use of this medicine in children. While it may be given to children as young as 6 years for selected conditions, precautions do apply. Overdosage: If you think you have taken too much of this medicine contact a poison control center or emergency room at once. NOTE: This medicine is only for you. Do not share this medicine with others. What if I miss a dose? This does not apply. What may interact with this medicine? This medicine may interact with the following medications:  acetaminophen  certain antibiotics like dapsone, nitrofurantoin, aminosalicylic acid, sulfonamides  certain medicines for seizures like phenobarbital, phenytoin, valproic acid  chloroquine  cyclophosphamide  flutamide  hydroxyurea  ifosfamide  metoclopramide  nitric oxide  nitroglycerin  nitroprusside  nitrous oxide  other local anesthetics like lidocaine, pramoxine, tetracaine  primaquine  quinine  rasburicase  sulfasalazine This list may not describe all possible interactions. Give your health care provider a list of all the medicines, herbs, non-prescription drugs, or dietary supplements you use. Also tell them if you smoke, drink alcohol, or use illegal drugs. Some items may interact with your medicine. What should I watch for while using this medicine? Your condition will be monitored carefully while you  are receiving this medicine. Be careful to avoid injury while the area is numb, and you are not aware of pain. What side effects may I notice from receiving this medicine? Side effects that you should report to your doctor or health care professional as soon as possible:  allergic reactions like skin rash, itching or hives, swelling of the face, lips, or tongue  seizures  signs and symptoms of a dangerous change in heartbeat or heart rhythm like chest pain; dizziness; fast, irregular heartbeat; palpitations; feeling faint or lightheaded; falls; breathing problems  signs and symptoms of methemoglobinemia such as pale, gray, or blue colored skin; headache; fast heartbeat; shortness of breath; feeling faint or lightheaded, falls; tiredness Side effects that usually do not require medical attention (report to your doctor or health care professional if they continue or are bothersome):  anxious  back pain  changes in taste  changes in vision  constipation  dizziness  fever  nausea, vomiting This list may not describe all possible side effects. Call your doctor for medical advice about side effects. You may report side effects to FDA at 1-800-FDA-1088. Where should I keep my medicine? This drug is given in a hospital or clinic and will not be stored at home. NOTE: This sheet is a summary. It may not cover all possible information. If you have questions about this medicine, talk to your doctor, pharmacist, or health care provider.  2021 Elsevier/Gold Standard (2019-03-27 12:24:57)  Keep bracelet on for 96 hours after surgery and show to any health care or dental provider

## 2020-04-21 NOTE — Op Note (Signed)
Patient:  Eileen Green  DOB:  22-Apr-1950  MRN:  [id number]   Preop Diagnosis: Biliary colic, cholelithiasis  Postop Diagnosis: Same  Procedure: Laparoscopic cholecystectomy  Surgeon: Aviva Signs, MD  Anes: General endotracheal  Indications: Patient is a 70 year old white female who presents with biliary colic secondary to cholelithiasis.  The risks and benefits of the procedure including bleeding, infection, hepatobiliary injury, the possibility of an open procedure were fully explained to the patient, who gave informed consent.  Procedure note: The patient was placed in supine position.  After induction of general endotracheal anesthesia, the abdomen was prepped and draped using the usual sterile technique with ChloraPrep.  Surgical site confirmation was performed.  A supraumbilical incision was made down to the fascia.  A Veress needle was introduced into the abdominal cavity and confirmation of placement was done using the saline drop test.  The abdomen was then insufflated to 15 mmHg pressure.  An 11 mm trocar was introduced into the abdominal cavity under direct visualization without difficulty.  The patient was placed in reverse Trendelenburg position and an additional 11 mm trocar was placed in the epigastric region and 5 mm trochars were placed the right upper quadrant and right flank regions.  Liver was inspected and noted to be within normal limits.  The gallbladder was retracted in a dynamic fashion in order to provide a critical view of the triangle of [place]ot.  The cystic duct was first identified.  Its juncture to the infundibulum was fully identified.  Endoclips were placed proximally and distally on the cystic duct, and the cystic duct was divided.  This was likewise done the cystic artery.  The gallbladder was freed away from the gallbladder fossa using Bovie electrocautery.  The gallbladder was delivered through the epigastric trocar site using an Endo Catch bag.  The gallbladder  fossa was inspected and no abnormal bleeding or bile leakage was noted.  Surgicel was placed in the gallbladder fossa.  All fluid and air were then evacuated from the abdominal cavity prior to removal of the trochars.  All wounds were irrigated with normal saline.  All wounds were injected with Exparel.  The epigastric fascia was reapproximated using an 0 Vicryl interrupted suture.  All skin incisions were closed using a 4-0 Monocryl subcuticular suture.  Dermabond was applied.  All tape and needle counts were correct at the end of the procedure.  The patient was extubated in the operating room and transferred to PACU in stable condition.  Complications: None  EBL: Minimal  Specimen: Gallbladder

## 2020-04-21 NOTE — Anesthesia Preprocedure Evaluation (Addendum)
Anesthesia Evaluation  Patient identified by MRN, date of birth, ID band Patient awake    Reviewed: Allergy & Precautions, H&P , NPO status , Patient's Chart, lab work & pertinent test results, reviewed documented beta blocker date and time   Airway Mallampati: II  TM Distance: >3 FB Neck ROM: full    Dental no notable dental hx.    Pulmonary neg pulmonary ROS,    Pulmonary exam normal breath sounds clear to auscultation       Cardiovascular Exercise Tolerance: Good hypertension, negative cardio ROS   Rhythm:regular Rate:Normal     Neuro/Psych negative neurological ROS  negative psych ROS   GI/Hepatic Neg liver ROS, hiatal hernia, GERD  Medicated,  Endo/Other  Hypothyroidism   Renal/GU negative Renal ROS  negative genitourinary   Musculoskeletal   Abdominal   Peds  Hematology negative hematology ROS (+)   Anesthesia Other Findings   Reproductive/Obstetrics negative OB ROS                             Anesthesia Physical Anesthesia Plan  ASA: II  Anesthesia Plan: General   Post-op Pain Management:    Induction:   PONV Risk Score and Plan: Ondansetron and Scopolamine patch - Pre-op  Airway Management Planned:   Additional Equipment:   Intra-op Plan:   Post-operative Plan:   Informed Consent: I have reviewed the patients History and Physical, chart, labs and discussed the procedure including the risks, benefits and alternatives for the proposed anesthesia with the patient or authorized representative who has indicated his/her understanding and acceptance.     Dental Advisory Given  Plan Discussed with: CRNA  Anesthesia Plan Comments:        Anesthesia Quick Evaluation

## 2020-04-21 NOTE — Anesthesia Postprocedure Evaluation (Signed)
Anesthesia Post Note  Patient: Eileen Green  Procedure(s) Performed: LAPAROSCOPIC CHOLECYSTECTOMY (N/A Abdomen)  Patient location during evaluation: Phase II Anesthesia Type: General Level of consciousness: awake Pain management: pain level controlled Vital Signs Assessment: post-procedure vital signs reviewed and stable Respiratory status: spontaneous breathing and respiratory function stable Cardiovascular status: blood pressure returned to baseline and stable Postop Assessment: no headache and no apparent nausea or vomiting Anesthetic complications: no Comments: Late entry   No complications documented.   Last Vitals:  Vitals:   04/21/20 0958 04/21/20 1004  BP: (!) 142/73 137/77  Pulse: 70 (!) 56  Resp: 10 16  Temp:  36.7 C  SpO2: 98% 99%    Last Pain:  Vitals:   04/21/20 1004  TempSrc: Oral  PainSc: [name]

## 2020-04-21 NOTE — Interval H&P Note (Signed)
History and Physical Interval Note:  04/21/2020 7:10 AM  Eileen Green  has presented today for surgery, with the diagnosis of Cholelithiasis.  The various methods of treatment have been discussed with the patient and family. After consideration of risks, benefits and other options for treatment, the patient has consented to  Procedure(s): LAPAROSCOPIC CHOLECYSTECTOMY (N/A) as a surgical intervention.  The patient's history has been reviewed, patient examined, no change in status, stable for surgery.  I have reviewed the patient's chart and labs.  Questions were answered to the patient's satisfaction.     Aviva Signs

## 2020-04-21 NOTE — Anesthesia Procedure Notes (Signed)
Procedure Name: Intubation Date/Time: 04/21/2020 7:40 AM Performed by: Jonna Munro, CRNA Pre-anesthesia Checklist: Patient identified, Emergency Drugs available, Suction available, Patient being monitored and Timeout performed Patient Re-evaluated:Patient Re-evaluated prior to induction Oxygen Delivery Method: Circle system utilized Preoxygenation: Pre-oxygenation with 100% oxygen Induction Type: IV induction Ventilation: Mask ventilation without difficulty Laryngoscope Size: Glidescope, 3 and Mac Grade View: Grade I Tube type: Oral Tube size: 7.0 mm Number of attempts: 1 Airway Equipment and Method: Stylet Placement Confirmation: ETT inserted through vocal cords under direct vision,  positive ETCO2 and breath sounds checked- equal and bilateral Secured at: 22 cm Tube secured with: Tape Dental Injury: Teeth and Oropharynx as per pre-operative assessment

## 2020-04-22 ENCOUNTER — Encounter (HOSPITAL_COMMUNITY): Payer: Self-pay | Admitting: General Surgery

## 2020-04-22 LAB — SURGICAL PATHOLOGY

## 2020-04-30 ENCOUNTER — Telehealth (INDEPENDENT_AMBULATORY_CARE_PROVIDER_SITE_OTHER): Payer: Federal, State, Local not specified - PPO | Admitting: General Surgery

## 2020-04-30 DIAGNOSIS — Z09 Encounter for follow-up examination after completed treatment for conditions other than malignant neoplasm: Secondary | ICD-10-CM

## 2020-04-30 NOTE — Telephone Encounter (Signed)
Postoperative telephone visit performed.  Patient was on her regular home phone.  She states she is doing well.  She denies any fever or chills.  Her appetite is normal.  She would like to resume normal activity.  I told her that was fine.  This was a postoperative telephone visit that is a part of the global fee, thus is not billable.

## 2020-06-10 ENCOUNTER — Other Ambulatory Visit: Payer: Self-pay | Admitting: Internal Medicine

## 2020-08-23 ENCOUNTER — Other Ambulatory Visit: Payer: Self-pay | Admitting: Gastroenterology

## 2020-08-23 DIAGNOSIS — K219 Gastro-esophageal reflux disease without esophagitis: Secondary | ICD-10-CM

## 2020-10-14 ENCOUNTER — Other Ambulatory Visit: Payer: Self-pay

## 2020-10-14 ENCOUNTER — Encounter: Payer: Self-pay | Admitting: Internal Medicine

## 2020-10-14 ENCOUNTER — Ambulatory Visit: Payer: Federal, State, Local not specified - PPO | Admitting: Internal Medicine

## 2020-10-14 VITALS — BP 134/60 | HR 100 | Ht 65.0 in | Wt 188.0 lb

## 2020-10-14 DIAGNOSIS — R0602 Shortness of breath: Secondary | ICD-10-CM | POA: Diagnosis not present

## 2020-10-14 DIAGNOSIS — Z8249 Family history of ischemic heart disease and other diseases of the circulatory system: Secondary | ICD-10-CM | POA: Diagnosis not present

## 2020-10-14 DIAGNOSIS — E785 Hyperlipidemia, unspecified: Secondary | ICD-10-CM | POA: Diagnosis not present

## 2020-10-14 DIAGNOSIS — I10 Essential (primary) hypertension: Secondary | ICD-10-CM | POA: Diagnosis not present

## 2020-10-14 MED ORDER — METOPROLOL TARTRATE 100 MG PO TABS: 100.0000 mg | ORAL_TABLET | Freq: Once | ORAL | 0 refills | Status: DC

## 2020-10-14 NOTE — Patient Instructions (Addendum)
Medication Instructions:  PLEASE TAKE 100 mg METOPROLOL 2 HOURS PRIOR TO CCTA SCAN  *If you need a refill on your cardiac medications before your next appointment, please call your pharmacy*  Lab Work: CMET AND LIPID PANEL NEXT WEEK- AT [place] If you have labs (blood work) drawn today and your tests are completely normal, you will receive your results only by: [place] (if you have MyChart) OR A paper copy in the mail If you have any lab test that is abnormal or we need to change your treatment, we will call you to review the results.  Testing/Procedures: Your physician has requested that you have cardiac CT. Cardiac computed tomography (CT) is a painless test that uses an x-ray machine to take clear, detailed pictures of your heart. For further information please visit [email or web address]. Please follow instruction sheet as given.  Follow-Up: At [facility], you and your health needs are our priority.  As part of our continuing mission to provide you with exceptional heart care, we have created designated Provider Care Teams.  These Care Teams include your primary Cardiologist (physician) and Advanced Practice Providers (APPs -  Physician Assistants and Nurse Practitioners) who all work together to provide you with the care you need, when you need it.  Your next appointment:   1 year(s)  The format for your next appointment:   In Person  Provider:   Dr. Debara Pickett   Other Instructions   Your cardiac CT will be scheduled at one of the below locations:   [facility] [address] [place], [place] [address] [phone]  If scheduled at [facility], please arrive at the [facility] main entrance (entrance A) of [facility] 30 minutes prior to test start time. Proceed to the [facility] Radiology Department (first floor) to check-in and test prep.  Please follow these instructions carefully (unless  otherwise directed):  On the Night Before the Test: Be sure to Drink plenty of water. Do not consume any caffeinated/decaffeinated beverages or chocolate 12 hours prior to your test. Do not take any antihistamines 12 hours prior to your test.  On the Day of the Test: Drink plenty of water until 1 hour prior to the test. Do not eat any food 4 hours prior to the test. You may take your regular medications prior to the test.  Take metoprolol (Lopressor) 100mg  two hours prior to test. FEMALES- please wear underwire-free bra if available, avoid dresses & tight clothing  After the Test: Drink plenty of water. After receiving IV contrast, you may experience a mild flushed feeling. This is normal. On occasion, you may experience a mild rash up to 24 hours after the test. This is not dangerous. If this occurs, you can take Benadryl 25 mg and increase your fluid intake. If you experience trouble breathing, this can be serious. If it is severe call 911 IMMEDIATELY. If it is mild, please call our office. If you take any of these medications: Glipizide/Metformin, Avandament, Glucavance, please do not take 48 hours after completing test unless otherwise instructed.  Please allow 2-4 weeks for scheduling of routine cardiac CTs. Some insurance companies require a pre-authorization which may delay scheduling of this test.   For non-scheduling related questions, please contact the cardiac imaging nurse navigator should you have any questions/concerns: Marchia Bond, Cardiac Imaging Nurse Navigator Gordy Clement, Cardiac Imaging Nurse Navigator [facility] Direct Office Dial: [phone]  For scheduling needs, including cancellations and rescheduling, please call [place], [phone].

## 2020-10-14 NOTE — Progress Notes (Signed)
OFFICE NOTE  Chief Complaint:  Fatigue, chest pressure  Primary Care Physician: Asencion Noble, MD  HPI:  Eileen Green is a 70 y.o. female patient referred by Dr. Willey Blade for evaluation of chest pain. She reports she has an episode of chest pain about every month. It might be related to stress. 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 She does have a family history of heart disease with her mother who had an MI in her 2s and her father who had chronic kidney disease and was on dialysis. She denies any worsening shortness of breath or increased fatigue or decreased exercise tolerance. She does have additional risk factors including hypertension and dyslipidemia which are well controlled. Her recent LDL cholesterol was 108 however she is on a low-dose of a low potency statin. She does not have a history of any side effects on that.  02/08/2016  [name] underwent a stress echocardiogram which was negative for ischemia. She had good exercise tolerance despite having had an upper respiratory infection about a week prior to that. She says she was able to tolerate exercise without any significant shortness of breath which is a good sign. Oxygen saturation today however was only 95% and she says she's recently had some cough. Blood pressure is at goal. I felt like her chest pain is noncardiac and seem somewhat atypical. She does have cardio Gerald Stabs factors and a family history of premature coronary disease and I agree with aggressive primary prevention.   10/03/2017  [name] returns today for follow-up.  She was seen this past summer by Jory Sims, DNP, and has been asymptomatic.  She did report that she was having some cramping in her calves.  Was thought this might be due to her pravastatin.  The dose was decreased from 40 to 20 mg and she was placed on ezetimibe.  Repeat lipid profile testing was performed 11 days ago which demonstrated a nice improvement  in her lipid profile total cholesterol 148, triglycerides 92, HDL 46 and LDL 84.  She reports she still has some possible statin side effects.  10/03/2018  [name] returns today for follow-up.  Overall she says she is feeling fairly well.  She is having some issues with possible anxiety.  She is feeling jittery when she does some of her classes to [employer].  She had possible side effects and pravastatin and ultimately was changed over to rosuvastatin 5 mg once weekly.  She also takes ezetimibe.  Repeat labs were performed recently which showed total cholesterol 166, triglycerides 70, HDL 56 and LDL of 97.  This is her target LDL less than 100.  Blood pressures well controlled today 130/84.  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  10/14/2019  [name] returns today for follow-up.  She underwent right total hip replacement by Dr. Ninfa Linden and seems to be doing well with regards to that.  She denies any chest pain or shortness of breath.  EKG shows sinus rhythm with sinus arrhythmia.  Labs in August showed total cholesterol 182, HDL 50 and triglycerides 104.  10/14/2020  [name] returns today for follow-up. She recently had to have gallbladder surgery. She continues on protonix 40 mg BID and famotidine, but has been having some chest discomfort and tightness in her throat. She was at a football game last weekend with her son in [place] and had an episode which  was much like severe reflux - she had also had some leg weakness and shortness of breath with exertion at the stadium.  She had prior stress echo testing in 2018, but nothing recent. Her mother had CABG at a younger age. Lipids last year were not at goal - LDL 113.   PMHx:  Past Medical History:  Diagnosis Date   Arthritis    Cancer ([place])    skin melanoma under left eye   Deaf    LEFT EAR   GERD (gastroesophageal reflux disease)    Hyperlipidemia Deaf in Left ear   Hypertension     Hypothyroidism     Past Surgical History:  Procedure Laterality Date   ABDOMINAL HYSTERECTOMY  2001   Partial   CHOLECYSTECTOMY N/A 04/21/2020   Procedure: LAPAROSCOPIC CHOLECYSTECTOMY;  Surgeon: Aviva Signs, MD;  Location: [facility];  Service: General;  Laterality: N/A;   COLONOSCOPY  01/21/2003   BPZ:WCHENI rectum/ Long capacious colon, moderate prep made the exam more challenging.   COLONOSCOPY  05/18/2009   Dr. Gala Romney: Normal rectum/narrow mouth left sided diverticula    COLONOSCOPY N/A 05/14/2013   Dr. Gala Romney: hemorrhoids, colonic diverticula, redudant colon   ESOPHAGOGASTRODUODENOSCOPY  01/21/2003   RMR:  Normal esophagus, stomach, D1 and D2   ESOPHAGOGASTRODUODENOSCOPY N/A 03/24/2014   Dr. Gala Romney :Hiatal hernia; otherwise negative EGD   ESOPHAGOGASTRODUODENOSCOPY N/A 06/27/2019   Procedure: ESOPHAGOGASTRODUODENOSCOPY (EGD);  Surgeon: Daneil Dolin, MD;  Location: [facility] SUITE;  Service: Endoscopy;  Laterality: N/A;  7:30am   TONSILLECTOMY     TOTAL HIP ARTHROPLASTY Right 05/02/2019   Procedure: RIGHT TOTAL HIP ARTHROPLASTY ANTERIOR APPROACH;  Surgeon: Mcarthur Rossetti, MD;  Location: [facility];  Service: Orthopedics;  Laterality: Right;    FAMHx:  Family History  Problem Relation Age of Onset   Heart disease Mother 74   Dementia Mother    Kidney disease Father    Thyroid nodules Father    Colon cancer Neg Hx     SOCHx:   reports that she has never smoked. She has never used smokeless tobacco. She reports that she does not drink alcohol and does not use drugs.  ALLERGIES:  No Known Allergies  ROS: Pertinent items noted in HPI and remainder of comprehensive ROS otherwise negative.  HOME MEDS: Current Outpatient Medications on File Prior to Visit  Medication Sig Dispense Refill   Bacillus Coagulans-Inulin (PROBIOTIC-PREBIOTIC PO) Take 1 capsule by mouth in the morning. Digestive Advantage     Cholecalciferol (VITAMIN D3) 50 MCG (2000 UT) capsule Take 2,000 Units by  mouth daily.     ezetimibe (ZETIA) 10 MG tablet TAKE 1 TABLET(10 MG) BY MOUTH DAILY (Patient taking differently: Take 10 mg by mouth daily.) 30 tablet 11   famotidine (PEPCID) 20 MG tablet TAKE 1 TABLET(20 MG) BY MOUTH AT BEDTIME (Patient taking differently: Take 20 mg by mouth daily.) 30 tablet 5   Glucosamine-Chondroitin (OSTEO BI-FLEX REGULAR STRENGTH PO) Take 1 tablet by mouth daily.      Methylcellulose, Laxative, 500 MG TABS Take 1,000 mg by mouth daily.      Multiple Vitamins-Minerals (ONE DAILY WOMENS 50 PLUS PO) Take 0.5 tablets by mouth See admin instructions. Mon, Wed Fri and Sat  Centrum Silver 50+     pantoprazole (PROTONIX) 40 MG tablet TAKE 1 TABLET(40 MG) BY MOUTH TWICE DAILY BEFORE A MEAL 60 tablet 5   polyethylene glycol (MIRALAX / GLYCOLAX) packet Take 17 g by mouth daily.     rosuvastatin (CRESTOR)  5 MG tablet TAKE 1 TABLET BY MOUTH ONCE A WEEK 12 tablet 3   SYNTHROID 50 MCG tablet Take 1 tablet (50 mcg total) by mouth daily. (Patient taking differently: Take 50 mcg by mouth daily before breakfast.) 30 tablet 11   valsartan (DIOVAN) 320 MG tablet Take 320 mg by mouth at bedtime.      No current facility-administered medications on file prior to visit.    LABS/IMAGING: No results found for this or any previous visit (from the past 48 hour(s)). No results found.  WEIGHTS: Wt Readings from Last 3 Encounters:  10/14/20 188 lb (85.3 kg)  04/20/20 185 lb (83.9 kg)  03/25/20 190 lb (86.2 kg)    VITALS: BP 134/60 (BP Location: Left Arm, Patient Position: Sitting, Cuff Size: Normal)   Pulse 100   Ht 5\' 5"  (1.651 m)   Wt 188 lb (85.3 kg)   SpO2 97%   BMI 31.28 kg/m   EXAM: General appearance: alert and no distress Neck: no carotid bruit, no JVD and thyroid not enlarged, symmetric, no tenderness/mass/nodules Lungs: clear to auscultation bilaterally Heart: regular rate and rhythm Abdomen: soft, non-tender; bowel sounds normal; no masses,  no organomegaly Extremities:  extremities normal, atraumatic, no cyanosis or edema Pulses: 2+ and symmetric Skin: Skin color, texture, turgor normal. No rashes or lesions Neurologic: Grossly normal Psych: Pleasant  EKG: Normal sinus rhythm at 100 - personally reviewed  ASSESSMENT: Dyspnea, chest pressure, throat tightness Intermittent chest pain - negative stress echo (2018) Hypertension-controlled Dyslipidemia, at goal LDL less than 100 Family history of coronary disease Murmur Hypothyroidism  PLAN: 1.   Mrs. Raburn has had some chest tightness, throat tightness and dyspnea recently - she had recent cholecystectomy in 04/2020 - has a small hiatal hernia on reflux meds, but had an episode of tightness in her chest that went to her throat. Not clearly exertional but getting around was not easy for her. I wonder if this could be an anginal equivalent -especially given her family history.  After some discussion she would like to pursue further testing. Would recommend a cardiac CT angiogram. HR was high today, but she is anxious - will probably need 100 mg metoprolol prior to the procedure. May get extra meds in radiology. Will obtain a repeat lipid and CMET - she wishes future labs to be drawn by her PCP which is fine with me.  Follow-up annually unless her study is abnormal, then will likely need to proceed with cath.  Pixie Casino, MD, [facility], [place] Director of the Advanced Lipid Disorders &  Cardiovascular Risk Reduction Clinic Diplomate of the American Board of Clinical Lipidology Attending Cardiologist  Direct Dial: [phone]  Fax: [phone]  Website:  [email or web address] 10/14/2020, 1:09 PM

## 2020-10-19 ENCOUNTER — Other Ambulatory Visit: Payer: Self-pay | Admitting: Cardiology

## 2020-10-20 ENCOUNTER — Telehealth (HOSPITAL_COMMUNITY): Payer: Self-pay | Admitting: *Deleted

## 2020-10-20 LAB — LIPID PANEL W/O CHOL/HDL RATIO
Cholesterol, Total: 171 mg/dL (ref 100–199)
HDL: 55 mg/dL (ref >39)
LDL Chol Calc (NIH): 94 mg/dL (ref 0–99)
Triglycerides: 125 mg/dL (ref 0–149)
VLDL Cholesterol Cal: 22 mg/dL (ref 5–40)

## 2020-10-20 LAB — SPECIMEN STATUS REPORT

## 2020-10-20 NOTE — Telephone Encounter (Signed)
Attempted to call patient regarding upcoming cardiac CT appointment and to remind patient to obtain blood work prior to appointment. Voicemail box not set up to leave a message.  Gordy Clement RN Navigator Cardiac Imaging [facility] [phone] Office [phone] Cell

## 2020-10-21 ENCOUNTER — Telehealth (HOSPITAL_COMMUNITY): Payer: Self-pay | Admitting: *Deleted

## 2020-10-21 NOTE — Telephone Encounter (Signed)
Reaching out to patient to offer assistance regarding upcoming cardiac imaging study; pt verbalizes understanding of appt date/time, parking situation and where to check in, pre-test NPO status and medications ordered, and verified current allergies; name and call back number provided for further questions should they arise ° °Avner Stroder RN Navigator Cardiac Imaging °[facility] °[phone] office °[phone] cell  ° °Patient to take 100mg metoprolol tartrate two hours prior to cardiac CT scan. °

## 2020-10-22 ENCOUNTER — Other Ambulatory Visit: Payer: Self-pay

## 2020-10-22 ENCOUNTER — Ambulatory Visit (HOSPITAL_COMMUNITY)
Admission: RE | Admit: 2020-10-22 | Discharge: 2020-10-22 | Disposition: A | Discharge status: Home / Self Care | Payer: Federal, State, Local not specified - PPO | Source: Ambulatory Visit | Attending: Internal Medicine | Admitting: Internal Medicine

## 2020-10-22 DIAGNOSIS — I700 Atherosclerosis of aorta: Secondary | ICD-10-CM | POA: Diagnosis not present

## 2020-10-22 DIAGNOSIS — R0602 Shortness of breath: Secondary | ICD-10-CM | POA: Diagnosis present

## 2020-10-22 MED ORDER — NITROGLYCERIN 0.4 MG SL SUBL
SUBLINGUAL_TABLET | SUBLINGUAL | Status: AC
  Filled 2020-10-22: qty 2

## 2020-10-22 MED ORDER — NITROGLYCERIN 0.4 MG SL SUBL
0.8000 mg | SUBLINGUAL_TABLET | Freq: Once | SUBLINGUAL | Status: AC
  Administered 2020-10-22: 0.8 mg via SUBLINGUAL

## 2020-10-22 MED ORDER — IOHEXOL 350 MG/ML SOLN
100.0000 mL | Freq: Once | INTRAVENOUS | Status: AC | PRN
  Administered 2020-10-22: 100 mL via INTRAVENOUS

## 2020-10-25 ENCOUNTER — Other Ambulatory Visit: Payer: Self-pay | Admitting: Internal Medicine

## 2020-11-02 ENCOUNTER — Ambulatory Visit: Payer: Federal, State, Local not specified - PPO | Admitting: Internal Medicine

## 2021-02-13 ENCOUNTER — Ambulatory Visit
Admission: EM | Admit: 2021-02-13 | Discharge: 2021-02-13 | Disposition: A | Discharge status: Home / Self Care | Payer: Federal, State, Local not specified - PPO | Attending: Family Medicine | Admitting: Family Medicine

## 2021-02-13 ENCOUNTER — Other Ambulatory Visit: Payer: Self-pay

## 2021-02-13 ENCOUNTER — Encounter: Payer: Self-pay | Admitting: Emergency Medicine

## 2021-02-13 DIAGNOSIS — U071 COVID-19: Secondary | ICD-10-CM | POA: Diagnosis not present

## 2021-02-13 DIAGNOSIS — Z1152 Encounter for screening for COVID-19: Secondary | ICD-10-CM | POA: Diagnosis not present

## 2021-02-13 MED ORDER — AZELASTINE HCL 0.1 % NA SOLN: 1.0000 | Freq: Two times a day (BID) | NASAL | 0 refills | Status: DC

## 2021-02-13 MED ORDER — MOLNUPIRAVIR EUA 200MG CAPSULE: 4.0000 | ORAL_CAPSULE | Freq: Two times a day (BID) | ORAL | 0 refills | Status: AC

## 2021-02-13 MED ORDER — PROMETHAZINE-DM 6.25-15 MG/5ML PO SYRP: 5.0000 mL | ORAL_SOLUTION | Freq: Four times a day (QID) | ORAL | 0 refills | Status: DC | PRN

## 2021-02-13 NOTE — ED Provider Notes (Signed)
[facility]    CSN: [id number] Arrival date & time: 02/13/21  1004      History   Chief Complaint Chief Complaint  Patient presents with   Nasal Congestion    HPI Eileen Green is a 71 y.o. female.   Presenting today with 4-day history of runny nose, congestion, cough, fatigue.  Denies chest pain, shortness of breath, abdominal pain, nausea vomiting or diarrhea.  Positive home COVID test this morning.  No known history of chronic pulmonary disease or immune compromise.  Past medical history significant for essential hypertension, hyperlipidemia.   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   Pre-operative clearance 03/31/2019   Family history of coronary artery disease 03/31/2019   Dysphagia 03/05/2019   Abdominal pain, epigastric 03/05/2019   Unilateral primary osteoarthritis, right hip 08/14/2018   Epigastric discomfort 12/29/2016   Dyslipidemia 01/13/2016   Murmur 01/13/2016   Essential hypertension 01/13/2016   Hiatal hernia    Dyspepsia 03/18/2014   RLQ abdominal pain 08/08/2013   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    COLONOSCOPY  05/18/2009   Dr. Gala Romney: Normal rectum/narrow mouth left sided diverticula    COLONOSCOPY N/A 05/14/2013   Dr. Gala Romney: hemorrhoids, colonic diverticula, redudant colon   ESOPHAGOGASTRODUODENOSCOPY  01/21/2003   RMR:  Normal esophagus, stomach, D1 and D2   ESOPHAGOGASTRODUODENOSCOPY N/A 03/24/2014   Dr. Gala Romney :Hiatal hernia; otherwise negative EGD   ESOPHAGOGASTRODUODENOSCOPY N/A 06/27/2019   Procedure: ESOPHAGOGASTRODUODENOSCOPY (EGD);  Surgeon: Daneil Dolin, MD;  Location: [facility];  Service: Endoscopy;  Laterality: N/A;  7:30am   TONSILLECTOMY     TOTAL HIP ARTHROPLASTY Right 05/02/2019   Procedure: RIGHT TOTAL HIP ARTHROPLASTY ANTERIOR APPROACH;  Surgeon: Mcarthur Rossetti, MD;  Location: [facility];  Service: Orthopedics;  Laterality: Right;    OB History   No obstetric history on file.      Home Medications    Prior to Admission medications   Medication Sig Start Date End Date Taking? Authorizing Provider  azelastine (ASTELIN) 0.1 % nasal spray Place 1 spray into both nostrils 2 (two) times daily. Use in each nostril as directed 02/13/21  Yes Orene Desanctis, Lilia Argue, PA-C  molnupiravir EUA (LAGEVRIO) 200 mg CAPS capsule Take 4 capsules (800 mg total) by mouth 2 (two) times daily for 5 days. 02/13/21 02/18/21 Yes Volney American, PA-C  promethazine-dextromethorphan (PROMETHAZINE-DM) 6.25-15 MG/5ML syrup Take 5 mLs by mouth 4 (four) times daily as needed. 02/13/21  Yes Volney American, PA-C  Bacillus Coagulans-Inulin (PROBIOTIC-PREBIOTIC PO) Take 1 capsule by mouth in the morning. Digestive Advantage  [provider]  Cholecalciferol (VITAMIN D3) 50 MCG (2000 UT) capsule Take 2,000 Units by mouth daily.    [provider]  ezetimibe (ZETIA) 10 MG tablet TAKE 1 TABLET(10 MG) BY MOUTH DAILY 10/27/20   Hilty, Nadean Corwin, MD  famotidine (PEPCID) 20 MG tablet TAKE 1 TABLET(20 MG) BY MOUTH AT BEDTIME Patient taking differently: Take 20 mg  by mouth daily. 02/17/20   Mahala Menghini, PA-C  Glucosamine-Chondroitin (OSTEO BI-FLEX REGULAR STRENGTH PO) Take 1 tablet by mouth daily.     [provider]  Methylcellulose, Laxative, 500 MG TABS Take 1,000 mg by mouth daily.     [provider]  metoprolol tartrate (LOPRESSOR) 100 MG tablet Take 1 tablet (100 mg total) by mouth once for 1 dose. PLEASE TAKE METOPROLOL 2  HOURS PRIOR TO CTA SCAN. 10/14/20 10/14/20  HiltyNadean Corwin, MD  Multiple Vitamins-Minerals (ONE DAILY WOMENS 50 PLUS PO) Take 0.5 tablets by mouth See admin instructions. Mon, Wed Fri and Sat  Centrum Silver 50+    [provider]  pantoprazole (PROTONIX) 40 MG tablet TAKE 1 TABLET(40 MG) BY MOUTH TWICE DAILY BEFORE A MEAL 08/23/20   Mahala Menghini, PA-C  polyethylene glycol (MIRALAX / GLYCOLAX) packet Take 17 g by mouth daily.    [provider]  rosuvastatin (CRESTOR) 5 MG tablet TAKE 1 TABLET BY MOUTH ONCE A WEEK 06/10/20   Hilty, Nadean Corwin, MD  SYNTHROID 50 MCG tablet Take 1 tablet (50 mcg total) by mouth daily. Patient taking differently: Take 50 mcg by mouth daily before breakfast. 06/04/13   Armandina Gemma, MD  valsartan (DIOVAN) 320 MG tablet Take 320 mg by mouth at bedtime.  09/08/18   [provider]    Family History Family History  Problem Relation Age of Onset   Heart disease Mother 36   Dementia Mother    Kidney disease Father    Thyroid nodules Father    Colon cancer Neg Hx     Social History Social History   Tobacco Use   Smoking status: Never   Smokeless tobacco: Never   Tobacco comments:    Never smoked  Vaping Use   Vaping Use: Never used  Substance Use Topics   Alcohol use: No   Drug use: No     Allergies   Patient has no known allergies.   Review of Systems Review of Systems Per HPI  Physical Exam Triage Vital Signs ED Triage Vitals [02/13/21 1132]  Enc Vitals Group     BP (!) 148/91     Pulse Rate 92     Resp 18     Temp 98.2 F  (36.8 C)     Temp Source Oral     SpO2 96 %     Weight 186 lb (84.4 kg)     Height 5\' 5"  (1.651 m)     Head Circumference      Peak Flow      Pain Score 7     Pain Loc      Pain Edu?      Excl. in [place]?    No data found.  Updated Vital Signs BP (!) 148/91 (BP Location: Right Arm)    Pulse 92    Temp 98.2 F (36.8 C) (Oral)    Resp 18    Ht 5\' 5"  (1.651 m)    Wt 186 lb (84.4 kg)    SpO2 96%    BMI 30.95 kg/m   Visual  Acuity Right Eye Distance:   Left Eye Distance:   Bilateral Distance:    Right Eye Near:   Left Eye Near:    Bilateral Near:     Physical Exam Vitals and nursing note reviewed.  Constitutional:      Appearance: Normal appearance.  HENT:     Head: Atraumatic.     Right Ear: Tympanic membrane and external ear normal.     Left Ear: Tympanic membrane and external ear normal.     Nose: Rhinorrhea present.     Mouth/Throat:     Mouth: Mucous membranes are moist.     Pharynx: Posterior oropharyngeal erythema present.  Eyes:     Extraocular Movements: Extraocular movements intact.     Conjunctiva/sclera: Conjunctivae normal.  Cardiovascular:     Rate and Rhythm: Normal rate and regular rhythm.     Heart sounds: Normal heart sounds.  Pulmonary:     Effort: Pulmonary effort is normal.     Breath sounds: Normal breath sounds. No wheezing.  Musculoskeletal:        General: Normal range of motion.     Cervical back: Normal range of motion and neck supple.  Skin:    General: Skin is warm and dry.  Neurological:     Mental Status: She is alert and oriented to person, place, and time.  Psychiatric:        Mood and Affect: Mood normal.        Thought Content: Thought content normal.     UC Treatments / Results  Labs (all labs ordered are listed, but only abnormal results are displayed) Labs Reviewed  COVID-19, FLU A+B NAA    EKG   Radiology No results found.  Procedures Procedures (including critical care time)  Medications Ordered in  UC Medications - No data to display  Initial Impression / Assessment and Plan / UC Course  I have reviewed the triage vital signs and the nursing notes.  Pertinent labs & imaging results that were available during my care of the patient were reviewed by me and considered in my medical decision making (see chart for details).     Vital signs reassuring, she is requesting retest for COVID to confirm home test results.  We will start molnupiravir, Astelin nasal spray, Phenergan DM for symptomatic benefit.  Discussed abortive over-the-counter medications, home care additionally.  Return for acutely worsening symptoms.  Final Clinical Impressions(s) / UC Diagnoses   Final diagnoses:  Encounter for screening for COVID-19  COVID-19   Discharge Instructions   None    ED Prescriptions     Medication Sig Dispense Auth. Provider   molnupiravir EUA (LAGEVRIO) 200 mg CAPS capsule Take 4 capsules (800 mg total) by mouth 2 (two) times daily for 5 days. 40 capsule Volney American, [place]   azelastine (ASTELIN) 0.1 % nasal spray Place 1 spray into both nostrils 2 (two) times daily. Use in each nostril as directed 30 mL Volney American, PA-C   promethazine-dextromethorphan (PROMETHAZINE-DM) 6.25-15 MG/5ML syrup Take 5 mLs by mouth 4 (four) times daily as needed. 100 mL Volney American, [place]      PDMP not reviewed this encounter.   Volney American, [place] 02/13/21 1159

## 2021-02-13 NOTE — ED Triage Notes (Addendum)
Pt reports nasal congestion,runny nose,cough for last several days. Reports positive home test this morning and reports would like covid test to confirm.

## 2021-02-14 LAB — COVID-19, FLU A+B NAA
Influenza A, NAA: NOT DETECTED
Influenza B, NAA: NOT DETECTED
SARS-CoV-2, NAA: DETECTED — AB

## 2021-03-02 DIAGNOSIS — E785 Hyperlipidemia, unspecified: Secondary | ICD-10-CM | POA: Insufficient documentation

## 2021-04-21 ENCOUNTER — Other Ambulatory Visit: Payer: Self-pay | Admitting: Gastroenterology

## 2021-04-21 DIAGNOSIS — K219 Gastro-esophageal reflux disease without esophagitis: Secondary | ICD-10-CM

## 2021-04-21 NOTE — Telephone Encounter (Signed)
Last ov 01/30/20 ?

## 2021-05-23 ENCOUNTER — Other Ambulatory Visit: Payer: Self-pay | Admitting: Internal Medicine

## 2021-08-16 ENCOUNTER — Encounter: Payer: Self-pay | Admitting: Internal Medicine

## 2021-08-16 ENCOUNTER — Ambulatory Visit: Payer: Federal, State, Local not specified - PPO | Admitting: Internal Medicine

## 2021-08-16 VITALS — BP 146/73 | HR 81 | Temp 97.5°F | Ht 65.0 in | Wt 187.6 lb

## 2021-08-16 DIAGNOSIS — K219 Gastro-esophageal reflux disease without esophagitis: Secondary | ICD-10-CM

## 2021-08-16 DIAGNOSIS — R109 Unspecified abdominal pain: Secondary | ICD-10-CM

## 2021-08-16 DIAGNOSIS — K449 Diaphragmatic hernia without obstruction or gangrene: Secondary | ICD-10-CM

## 2021-08-16 NOTE — Patient Instructions (Signed)
It was good to see you again today!  As discussed, we will simplify your regimen for acid reflux as follows:  Stop famotidine/Pepcid.  Stop pantoprazole/Protonix entirely  Begin Dexilant 60 mg capsule 30 minutes before breakfast daily.  Dispense 30 with 11 refills  GERD information provided  Elevate the head of your bed, avoid late night meals, etc.  Low up with Dr. Willey Blade as planned   Office visit here in 4 months

## 2021-08-16 NOTE — Progress Notes (Unsigned)
Primary Care Physician:  Asencion Noble, MD Primary Gastroenterologist:  Dr. Gala Romney  Pre-Procedure History & Physical: HPI:  Eileen Green is a 71 y.o. female here for follow-up of abdominal pain.  History of symptomatic cholelithiasis.  Underwent a cholecystectomy first of last year by Dr. Arnoldo Morale.  This was helpful overall.  She continues to have some reflux symptoms by her report, she states that she was told her creatinine was was elevated through Dr. Ria Comment office.  It was to be followed.  On her own, she backed off to taking Protonix once daily.  She continues to have take famotidine at bedtime.  No dysphagia.  She does have nocturnal reflux symptoms.  Full column reflux noted on prior barium study.  Does have occasional epigastric bilateral upper quadrant abdominal pain.  Chronically constipated takes MiraLAX with good results.  No bleeding or melena.  Diverticulosis on 2015 colonoscopy-slated for average risk screening 2025.  Systolic blood pressure up a bit today.  She is seeing Dr. Willey Blade.  Patient feels like she is stressed out every day although retired, she is taking care of a toddler and soon will be taking care of twin newborns during the day.  Throughout the interview,  she makes reference to her friends and family members that have come up with their own GI ailments and wonders if she has their problems.  Past Medical History:  Diagnosis Date   Arthritis    Cancer ([place])    skin melanoma under left eye   Deaf    LEFT EAR   GERD (gastroesophageal reflux disease)    Hyperlipidemia Deaf in Left ear   Hypertension    Hypothyroidism     Past Surgical History:  Procedure Laterality Date   ABDOMINAL HYSTERECTOMY  2001   Partial   CHOLECYSTECTOMY N/A 04/21/2020   Procedure: LAPAROSCOPIC CHOLECYSTECTOMY;  Surgeon: Aviva Signs, MD;  Location: [facility];  Service: General;  Laterality: N/A;   COLONOSCOPY  01/21/2003   [id number] rectum/ Long capacious colon, moderate prep made  the exam more challenging.   COLONOSCOPY  05/18/2009   Dr. Gala Romney: Normal rectum/narrow mouth left sided diverticula    COLONOSCOPY N/A 05/14/2013   Dr. Gala Romney: hemorrhoids, colonic diverticula, redudant colon   ESOPHAGOGASTRODUODENOSCOPY  01/21/2003   RMR:  Normal esophagus, stomach, D1 and D2   ESOPHAGOGASTRODUODENOSCOPY N/A 03/24/2014   Dr. Gala Romney :Hiatal hernia; otherwise negative EGD   ESOPHAGOGASTRODUODENOSCOPY N/A 06/27/2019   Procedure: ESOPHAGOGASTRODUODENOSCOPY (EGD);  Surgeon: Daneil Dolin, MD;  [facility];  Service: Endoscopy;  Laterality: N/A;  7:30am   TONSILLECTOMY     TOTAL HIP ARTHROPLASTY Right 05/02/2019   Procedure: RIGHT TOTAL HIP ARTHROPLASTY ANTERIOR APPROACH;  Surgeon: Mcarthur Rossetti, MD;  Location: [facility];  Service: Orthopedics;  Laterality: Right;    Prior to Admission medications   Medication Sig Start Date End Date Taking? Authorizing Provider  amLODipine (NORVASC) 2.5 MG tablet Take 2.5 mg by mouth daily. 06/24/21  Yes [provider]  Bacillus Coagulans-Inulin (PROBIOTIC-PREBIOTIC PO) Take 1 capsule by mouth in the morning. Digestive Advantage   Yes [provider]  Cholecalciferol (VITAMIN D3) 50 MCG (2000 UT) capsule Take 2,000 Units by mouth daily.   Yes [provider]  ezetimibe (ZETIA) 10 MG tablet TAKE 1 TABLET(10 MG) BY MOUTH DAILY 10/27/20  Yes Hilty, Nadean Corwin, MD  famotidine (PEPCID) 20 MG tablet TAKE 1 TABLET(20 MG) BY MOUTH AT BEDTIME Patient taking differently: Take 20 mg by mouth daily. 02/17/20  Yes Mahala Menghini, PA-C  Glucosamine-Chondroitin (OSTEO BI-FLEX REGULAR STRENGTH PO) Take 1 tablet by mouth daily.    Yes [provider]  Methylcellulose, Laxative, 500 MG TABS Take 1,000 mg by mouth daily.    Yes [provider]  Multiple Vitamins-Minerals (ONE DAILY WOMENS 50 PLUS PO) Take 0.5 tablets by mouth See admin instructions. Mon, Wed Fri and Sat  Centrum Silver 50+   Yes  [provider]  pantoprazole (PROTONIX) 40 MG tablet TAKE 1 TABLET(40 MG) BY MOUTH TWICE DAILY BEFORE A MEAL Patient taking differently: Take 40 mg by mouth daily. 04/21/21  Yes Elaiza Shoberg, Cristopher Estimable, MD  polyethylene glycol [facility] / Floria Raveling) packet Take 17 g by mouth daily.   Yes [provider]  rosuvastatin (CRESTOR) 5 MG tablet TAKE 1 TABLET BY MOUTH 1 TIME A WEEK 05/23/21  Yes Hilty, Nadean Corwin, MD  SYNTHROID 50 MCG tablet Take 1 tablet (50 mcg total) by mouth daily. Patient taking differently: Take 50 mcg by mouth daily before breakfast. 06/04/13  Yes Armandina Gemma, MD  valsartan (DIOVAN) 320 MG tablet Take 320 mg by mouth at bedtime.  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[employer], Brewing technologist in math  Tobacco Use   Smoking status: Never   Smokeless tobacco: Never   Tobacco comments:    Never smoked  Vaping Use   Vaping Use: Never used  Substance and Sexual Activity   Alcohol use: No   Drug use: No   Sexual activity: Yes  Other Topics Concern   Not on file  Social History Narrative   Not on file   Social Determinants of Health   Financial Resource Strain: Not on file  Food Insecurity: Not on file  Transportation Needs: Not on file  Physical Activity: Not on file  Stress: Not on file  Social Connections: Not on file  Intimate Partner Violence: Not on file    Review of Systems: See HPI, otherwise negative ROS  Physical Exam: BP (!) 146/73 (BP  Location: Left Arm, Patient Position: Sitting, Cuff Size: Normal)   Pulse 81   Temp (!) 97.5 F (36.4 C) (Temporal)   Ht '5\' 5"'$  (1.651 m)   Wt 187 lb 9.6 oz (85.1 kg)   SpO2 96%   BMI 31.22 kg/m  General:   Alert,   well-nourished, pleasant and cooperative in NAD Neck:  Supple; no masses or thyromegaly. No significant cervical adenopathy. Lungs:  Clear throughout to auscultation.   No wheezes, crackles, or rhonchi. No acute distress. Heart:  Regular rate and rhythm; no murmurs, clicks, rubs,  or gallops. Abdomen: Non-distended, normal bowel sounds.  Soft and nontender without appreciable mass or hepatosplenomegaly.  Pulses:  Normal pulses noted. Extremities:  Without clubbing or edema.  Impression/Plan:    71 year old lady with well-established GERD and constipation.  History of chronic  abdominal pain -  clearly, patient feels that cholecystectomy last year was of great benefit in helping her abdominal pain.  Though she does still continue to cite some vague abdominal discomfort, overall she seems to be doing very well.  There are no alarm symptoms.  She has a very heightened level of concern about potential nephrotoxicity of acid suppressing agents.  She backed off on her pantoprazole to once daily.  Continues taking famotidine.  Having nocturnal symptoms.  We reviewed the multipronged approach to reflux as well as the management of constipation.  Systolic blood pressure up today.  Recommendations:  As discussed, we will simplify your regimen for acid reflux as follows:  Stop famotidine/Pepcid.  Stop pantoprazole/Protonix entirely  Begin Dexilant 60 mg capsule 30 minutes before breakfast daily.  Dispense 30 with 11 refills  GERD information provided  Elevate the head of your bed, avoid late night meals, etc.  Low up with Dr. Willey Blade as planned   Office visit here in 4 months    Notice: This dictation was prepared with Dragon dictation along with smaller phrase technology. Any  transcriptional errors that result from this process are unintentional and may not be corrected upon review.

## 2021-08-17 ENCOUNTER — Other Ambulatory Visit: Payer: Self-pay

## 2021-08-17 MED ORDER — DEXLANSOPRAZOLE 60 MG PO CPDR: 60.0000 mg | DELAYED_RELEASE_CAPSULE | Freq: Every day | ORAL | 3 refills | Status: DC

## 2021-08-18 ENCOUNTER — Telehealth: Payer: Self-pay

## 2021-08-18 NOTE — Telephone Encounter (Signed)
PA for Dexlansoprazole 60 mg capsules was approved 07/19/21 through 08/18/2022. Approval letter to be scanned into patient's chart.

## 2021-09-29 ENCOUNTER — Other Ambulatory Visit (HOSPITAL_COMMUNITY): Payer: Self-pay | Admitting: Internal Medicine

## 2021-09-29 ENCOUNTER — Other Ambulatory Visit: Payer: Self-pay | Admitting: Internal Medicine

## 2021-09-29 DIAGNOSIS — R1011 Right upper quadrant pain: Secondary | ICD-10-CM

## 2021-10-04 ENCOUNTER — Ambulatory Visit (HOSPITAL_COMMUNITY)
Admission: RE | Admit: 2021-10-04 | Discharge: 2021-10-04 | Disposition: A | Discharge status: Home / Self Care | Payer: Federal, State, Local not specified - PPO | Source: Ambulatory Visit | Attending: Internal Medicine | Admitting: Internal Medicine

## 2021-10-04 DIAGNOSIS — R1011 Right upper quadrant pain: Secondary | ICD-10-CM | POA: Insufficient documentation

## 2021-10-30 ENCOUNTER — Other Ambulatory Visit: Payer: Self-pay | Admitting: Internal Medicine

## 2021-11-01 ENCOUNTER — Encounter: Payer: Self-pay | Admitting: *Deleted

## 2021-11-14 ENCOUNTER — Encounter: Payer: Self-pay | Admitting: Internal Medicine

## 2021-11-14 ENCOUNTER — Ambulatory Visit: Payer: Federal, State, Local not specified - PPO | Attending: Internal Medicine | Admitting: Internal Medicine

## 2021-11-14 VITALS — BP 150/80 | HR 61 | Ht 65.0 in | Wt 186.2 lb

## 2021-11-14 DIAGNOSIS — I10 Essential (primary) hypertension: Secondary | ICD-10-CM | POA: Diagnosis not present

## 2021-11-14 DIAGNOSIS — R0602 Shortness of breath: Secondary | ICD-10-CM | POA: Diagnosis not present

## 2021-11-14 DIAGNOSIS — E785 Hyperlipidemia, unspecified: Secondary | ICD-10-CM

## 2021-11-14 NOTE — Patient Instructions (Signed)
Medication Instructions:  STOP Rosuvastatin   *If you need a refill on your cardiac medications before your next appointment, please call your pharmacy*   Lab Work: LIPID (3 months, come back fasting- nothing to eat or drink, no appointment needed)   If you have labs (blood work) drawn today and your tests are completely normal, you will receive your results only by: [place] (if you have MyChart) OR A paper copy in the mail If you have any lab test that is abnormal or we need to change your treatment, we will call you to review the results.   Follow-Up: At [facility], you and your health needs are our priority.  As part of our continuing mission to provide you with exceptional heart care, we have created designated Provider Care Teams.  These Care Teams include your primary Cardiologist (physician) and Advanced Practice Providers (APPs -  Physician Assistants and Nurse Practitioners) who all work together to provide you with the care you need, when you need it.  We recommend signing up for the patient portal called "MyChart".  Sign up information is provided on this After Visit Summary.  MyChart is used to connect with patients for Virtual Visits (Telemedicine).  Patients are able to view lab/test results, encounter notes, upcoming appointments, etc.  Non-urgent messages can be sent to your provider as well.   To learn more about what you can do with MyChart, go to [email or web address].    Your next appointment:   12 month(s)  The format for your next appointment:   In Person  Provider:   Pixie Casino, MD

## 2021-11-14 NOTE — Progress Notes (Signed)
OFFICE NOTE  Chief Complaint:  Leg cramping, head pain  Primary Care Physician: Asencion Noble, MD  HPI:  Eileen Green is a 71 y.o. female patient referred by Dr. Willey Blade for evaluation of chest pain. She reports she has an episode of chest pain about every month. It might be related to stress. 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 She does have a family history of heart disease with her mother who had an MI in her 39s and her father who had chronic kidney disease and was on dialysis. She denies any worsening shortness of breath or increased fatigue or decreased exercise tolerance. She does have additional risk factors including hypertension and dyslipidemia which are well controlled. Her recent LDL cholesterol was 108 however she is on a low-dose of a low potency statin. She does not have a history of any side effects on that.  02/08/2016  Eileen Green underwent a stress echocardiogram which was negative for ischemia. She had good exercise tolerance despite having had an upper respiratory infection about a week prior to that. She says she was able to tolerate exercise without any significant shortness of breath which is a good sign. Oxygen saturation today however was only 95% and she says she's recently had some cough. Blood pressure is at goal. I felt like her chest pain is noncardiac and seem somewhat atypical. She does have cardio Gerald Stabs factors and a family history of premature coronary disease and I agree with aggressive primary prevention.   10/03/2017  Eileen Green returns today for follow-up.  She was seen this past summer by Jory Sims, DNP, and has been asymptomatic.  She did report that she was having some cramping in her calves.  Was thought this might be due to her pravastatin.  The dose was decreased from 40 to 20 mg and she was placed on ezetimibe.  Repeat lipid profile testing was performed 11 days ago which demonstrated a nice improvement  in her lipid profile total cholesterol 148, triglycerides 92, HDL 46 and LDL 84.  She reports she still has some possible statin side effects.  10/03/2018  Mrs. [name] returns today for follow-up.  Overall she says she is feeling fairly well.  She is having some issues with possible anxiety.  She is feeling jittery when she does some of her classes to [employer].  She had possible side effects and pravastatin and ultimately was changed over to rosuvastatin 5 mg once weekly.  She also takes ezetimibe.  Repeat labs were performed recently which showed total cholesterol 166, triglycerides 70, HDL 56 and LDL of 97.  This is her target LDL less than 100.  Blood pressures well controlled today 130/84.  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  10/14/2019  Eileen Green returns today for follow-up.  She underwent right total hip replacement by Dr. Ninfa Linden and seems to be doing well with regards to that.  She denies any chest pain or shortness of breath.  EKG shows sinus rhythm with sinus arrhythmia.  Labs in August showed total cholesterol 182, HDL 50 and triglycerides 104.  10/14/2020  Eileen Green returns today for follow-up. She recently had to have gallbladder surgery. She continues on protonix 40 mg BID and famotidine, but has been having some chest discomfort and tightness in her throat. She was at a football game last weekend with her son in [place] and had an episode  which was much like severe reflux - she had also had some leg weakness and shortness of breath with exertion at the stadium.  She had prior stress echo testing in 2018, but nothing recent. Her mother had CABG at a younger age. Lipids last year were not at goal - LDL 113.   11/14/2021  Eileen Green is seen today in follow-up.  She has some vague complaints including leg cramping, pain behind the right ear and some occasional throat pain.  We did have a CT coronary angiogram performed a year or 2 ago  which showed essentially no coronary disease.  She is concerned about her leg pain as to whether this might be PAD.  She denies any true symptoms of claudication.  She gets some occasional cramping.  She also gets some pain in her jaw.  She was not sure if this might be related to statins.  She only takes Crestor 5 mg once a week.  She also is on Zetia.  Her LDL is actually been quite low with total cholesterol 154, triglycerides 92, HDL 48 and LDL 89.  PMHx:  Past Medical History:  Diagnosis Date   Arthritis    Cancer ([place])    skin melanoma under left eye   Deaf    LEFT EAR   GERD (gastroesophageal reflux disease)    Hyperlipidemia Deaf in Left ear   Hypertension    Hypothyroidism     Past Surgical History:  Procedure Laterality Date   ABDOMINAL HYSTERECTOMY  2001   Partial   CHOLECYSTECTOMY N/A 04/21/2020   Procedure: LAPAROSCOPIC CHOLECYSTECTOMY;  Surgeon: Aviva Signs, MD;  Location: [facility];  Service: General;  Laterality: N/A;   COLONOSCOPY  01/21/2003   [id number] rectum/ Long capacious colon, moderate prep made the exam more challenging.   COLONOSCOPY  05/18/2009   Dr. Gala Romney: Normal rectum/narrow mouth left sided diverticula    COLONOSCOPY N/A 05/14/2013   Dr. Gala Romney: hemorrhoids, colonic diverticula, redudant colon   ESOPHAGOGASTRODUODENOSCOPY  01/21/2003   RMR:  Normal esophagus, stomach, D1 and D2   ESOPHAGOGASTRODUODENOSCOPY N/A 03/24/2014   Dr. Gala Romney :Hiatal hernia; otherwise negative EGD   ESOPHAGOGASTRODUODENOSCOPY N/A 06/27/2019   Procedure: ESOPHAGOGASTRODUODENOSCOPY (EGD);  Surgeon: Daneil Dolin, MD;  [facility];  Service: Endoscopy;  Laterality: N/A;  7:30am   TONSILLECTOMY     TOTAL HIP ARTHROPLASTY Right 05/02/2019   Procedure: RIGHT TOTAL HIP ARTHROPLASTY ANTERIOR APPROACH;  Surgeon: Mcarthur Rossetti, MD;  Location: [facility];  Service: Orthopedics;  Laterality: Right;    FAMHx:  Family History  Problem Relation Age of Onset   Heart  disease Mother 77   Dementia Mother    Kidney disease Father    Thyroid nodules Father    Colon cancer Neg Hx     SOCHx:   reports that she has never smoked. She has never used smokeless tobacco. She reports that she does not drink alcohol and does not use drugs.  ALLERGIES:  No Known Allergies  ROS: Pertinent items noted in HPI and remainder of comprehensive ROS otherwise negative.  HOME MEDS: Current Outpatient Medications on File Prior to Visit  Medication Sig Dispense Refill   amLODipine (NORVASC) 5 MG tablet Take 5 mg by mouth daily.     Carboxymethylcellulose Sod PF (THERATEARS PF) 0.25 % SOLN 1 dropperette as needed by ophthalmic route.     Cholecalciferol (VITAMIN D3) 50 MCG (2000 UT) capsule Take 2,000 Units by mouth daily.     dexlansoprazole (DEXILANT) 60  MG capsule Take 1 capsule (60 mg total) by mouth daily. 90 capsule 3   ezetimibe (ZETIA) 10 MG tablet TAKE 1 TABLET(10 MG) BY MOUTH DAILY 30 tablet 3   Glucosamine-Chondroitin (OSTEO BI-FLEX REGULAR STRENGTH PO) Take 1 tablet by mouth daily.      Methylcellulose, Laxative, 500 MG TABS Take 1,000 mg by mouth daily.      Multiple Vitamins-Minerals (ONE DAILY WOMENS 50 PLUS PO) Take 0.5 tablets by mouth See admin instructions. Mon, Wed Fri and Sat  Centrum Silver 50+     polyethylene glycol (MIRALAX / GLYCOLAX) packet Take 17 g by mouth daily.     Probiotic Product (CULTURELLE PRO & PREBIOTIC PO) Take 1 tablet by mouth daily.     rosuvastatin (CRESTOR) 5 MG tablet TAKE 1 TABLET BY MOUTH 1 TIME A WEEK 12 tablet 3   SYNTHROID 50 MCG tablet Take 1 tablet (50 mcg total) by mouth daily. (Patient taking differently: Take 50 mcg by mouth daily before breakfast.) 30 tablet 11   valsartan (DIOVAN) 320 MG tablet Take 320 mg by mouth at bedtime.      No current facility-administered medications on file prior to visit.    LABS/IMAGING: No results found for this or any previous visit (from the past 48 hour(s)). No results  found.  WEIGHTS: Wt Readings from Last 3 Encounters:  11/14/21 186 lb 3.2 oz (84.5 kg)  08/16/21 187 lb 9.6 oz (85.1 kg)  02/13/21 186 lb (84.4 kg)    VITALS: BP (!) 150/80 (BP Location: Left Arm, Patient Position: Sitting)   Pulse 61   Ht '5\' 5"'$  (1.651 m)   Wt 186 lb 3.2 oz (84.5 kg)   SpO2 98%   BMI 30.99 kg/m   EXAM: General appearance: alert and no distress Neck: no carotid bruit, no JVD and thyroid not enlarged, symmetric, no tenderness/mass/nodules Lungs: clear to auscultation bilaterally Heart: regular rate and rhythm Abdomen: soft, non-tender; bowel sounds normal; no masses,  no organomegaly Extremities: extremities normal, atraumatic, no cyanosis or edema Pulses: 2+ and symmetric Skin: Skin color, texture, turgor normal. No rashes or lesions Neurologic: Grossly normal Psych: Pleasant  EKG: Normal sinus rhythm at 61- personally reviewed  ASSESSMENT: Dyspnea, chest pressure, throat tightness -essentially normal CT coronary angiogram on October 22, 2020, 0 CAC score Intermittent chest pain - negative stress echo (2018) Hypertension-controlled Dyslipidemia, at goal LDL less than 100 Family history of coronary disease Murmur Hypothyroidism  PLAN: 1.   Mrs. Parillo had essentially no coronary disease on her CT or angiogram last year.  She had some concern about leg pain as to whether this could be PAD but has bounding pulses on exam today.  Blood pressure was elevated.  She had some recent changes in her blood pressure medicine and will follow up with Dr. Willey Blade about this in the next few weeks.  Her cholesterol has been pretty well controlled.  She may be having side effects on statin.  I advised her to stop the rosuvastatin (which she only takes once a week), and continue on Zetia daily.  We will go ahead and repeat a lipid profile in about 3 months.  She can follow-up with me annually or sooner as necessary.  Pixie Casino, MD, [facility], [place] Director of the [facility]  [facility] Diplomate of the American Board of Clinical Lipidology Attending Cardiologist  Direct Dial: [phone]  Fax: [phone]  Website:  [email or web address]  Nadean Corwin Verita Kuroda 11/14/2021, 11:12 AM

## 2021-12-13 ENCOUNTER — Other Ambulatory Visit: Payer: Self-pay | Admitting: Podiatry

## 2021-12-13 ENCOUNTER — Ambulatory Visit: Payer: Federal, State, Local not specified - PPO | Admitting: Internal Medicine

## 2021-12-13 ENCOUNTER — Encounter: Payer: Self-pay | Admitting: Internal Medicine

## 2021-12-13 ENCOUNTER — Ambulatory Visit (INDEPENDENT_AMBULATORY_CARE_PROVIDER_SITE_OTHER): Payer: Federal, State, Local not specified - PPO

## 2021-12-13 ENCOUNTER — Ambulatory Visit: Payer: Federal, State, Local not specified - PPO | Admitting: Podiatry

## 2021-12-13 VITALS — BP 137/78 | HR 78 | Temp 97.9°F | Ht 65.5 in | Wt 186.6 lb

## 2021-12-13 VITALS — BP 152/78

## 2021-12-13 DIAGNOSIS — R109 Unspecified abdominal pain: Secondary | ICD-10-CM

## 2021-12-13 DIAGNOSIS — M19079 Primary osteoarthritis, unspecified ankle and foot: Secondary | ICD-10-CM

## 2021-12-13 DIAGNOSIS — M79671 Pain in right foot: Secondary | ICD-10-CM

## 2021-12-13 DIAGNOSIS — M722 Plantar fascial fibromatosis: Secondary | ICD-10-CM | POA: Diagnosis not present

## 2021-12-13 DIAGNOSIS — K219 Gastro-esophageal reflux disease without esophagitis: Secondary | ICD-10-CM

## 2021-12-13 MED ORDER — MELOXICAM 7.5 MG PO TABS: 7.5000 mg | ORAL_TABLET | Freq: Every day | ORAL | 0 refills | Status: DC | PRN

## 2021-12-13 MED ORDER — TRIAMCINOLONE ACETONIDE 10 MG/ML IJ SUSP
10.0000 mg | Freq: Once | INTRAMUSCULAR | Status: AC
  Administered 2021-12-13: 10 mg

## 2021-12-13 MED ORDER — DEXLANSOPRAZOLE 60 MG PO CPDR: 60.0000 mg | DELAYED_RELEASE_CAPSULE | Freq: Every day | ORAL | 11 refills | Status: DC

## 2021-12-13 MED ORDER — TRIAMCINOLONE ACETONIDE 0.025 % EX OINT: 1.0000 | TOPICAL_OINTMENT | Freq: Two times a day (BID) | CUTANEOUS | 0 refills | Status: DC

## 2021-12-13 NOTE — Progress Notes (Signed)
Subjective:   Patient ID: Eileen Green, female   DOB: 71 y.o.   MRN: [id number]   HPI No chief complaint on file.  71 year old female presents the office today with concerns of pain to the heel.  It has been ongoing for some time but has been getting worse. She notices when she gets out of the recliner/chair she can arly walk but as she walks it ets better. If she gets up to tgo the bathroom it will hurt. If she does a lot of walking/shopping it will hurt as well. It is always there is alittle bit but worse going form sitting to standing. No injuries. No swelling. No numbness/tingling. No recent treatment.    Review of Systems  All other systems reviewed and are negative.  Past Medical History:  Diagnosis Date   Arthritis    Cancer ([place])    skin melanoma under left eye   Deaf    LEFT EAR   GERD (gastroesophageal reflux disease)    Hyperlipidemia Deaf in Left ear   Hypertension    Hypothyroidism     Past Surgical History:  Procedure Laterality Date   ABDOMINAL HYSTERECTOMY  2001   Partial   CHOLECYSTECTOMY N/A 04/21/2020   Procedure: LAPAROSCOPIC CHOLECYSTECTOMY;  Surgeon: Aviva Signs, MD;  Location: [facility];  Service: General;  Laterality: N/A;   COLONOSCOPY  01/21/2003   [id number] rectum/ Long capacious colon, moderate prep made the exam more challenging.   COLONOSCOPY  05/18/2009   Dr. Gala Romney: Normal rectum/narrow mouth left sided diverticula    COLONOSCOPY N/A 05/14/2013   Dr. Gala Romney: hemorrhoids, colonic diverticula, redudant colon   ESOPHAGOGASTRODUODENOSCOPY  01/21/2003   RMR:  Normal esophagus, stomach, D1 and D2   ESOPHAGOGASTRODUODENOSCOPY N/A 03/24/2014   Dr. Gala Romney :Hiatal hernia; otherwise negative EGD   ESOPHAGOGASTRODUODENOSCOPY N/A 06/27/2019   Procedure: ESOPHAGOGASTRODUODENOSCOPY (EGD);  Surgeon: Daneil Dolin, MD;  [facility];  Service: Endoscopy;  Laterality: N/A;  7:30am   TONSILLECTOMY     TOTAL HIP ARTHROPLASTY Right 05/02/2019   Procedure:  RIGHT TOTAL HIP ARTHROPLASTY ANTERIOR APPROACH;  Surgeon: Mcarthur Rossetti, MD;  [facility];  Service: Orthopedics;  Laterality: Right;     Current Outpatient Medications:    meloxicam (MOBIC) 7.5 MG tablet, Take 1 tablet (7.5 mg total) by mouth daily as needed for pain., Disp: 30 tablet, Rfl: 0   triamcinolone (KENALOG) 0.025 % ointment, Apply 1 Application topically 2 (two) times daily., Disp: 30 g, Rfl: 0   amLODipine (NORVASC) 5 MG tablet, Take 5 mg by mouth daily., Disp: , Rfl:    Carboxymethylcellulose Sod PF (THERATEARS PF) 0.25 % SOLN, 1 dropperette as needed by ophthalmic route., Disp: , Rfl:    Cholecalciferol (VITAMIN D3) 50 MCG (2000 UT) capsule, Take 2,000 Units by mouth daily., Disp: , Rfl:    dexlansoprazole (DEXILANT) 60 MG capsule, Take 1 capsule (60 mg total) by mouth daily., Disp: 30 capsule, Rfl: 11   ezetimibe (ZETIA) 10 MG tablet, TAKE 1 TABLET(10 MG) BY MOUTH DAILY, Disp: 30 tablet, Rfl: 3   Glucosamine-Chondroitin (OSTEO BI-FLEX REGULAR STRENGTH PO), Take 1 tablet by mouth daily. , Disp: , Rfl:    ketoconazole (NIZORAL) 2 % cream, Apply topically 2 (two) times daily., Disp: , Rfl:    Methylcellulose, Laxative, 500 MG TABS, Take 1,000 mg by mouth daily. , Disp: , Rfl:    Multiple Vitamins-Minerals (ONE DAILY WOMENS 50 PLUS PO), Take 0.5 tablets by mouth See admin instructions. [place], [place]  Fri and Sat  Centrum Silver 50+, Disp: , Rfl:    polyethylene glycol (MIRALAX / GLYCOLAX) packet, Take 17 g by mouth daily., Disp: , Rfl:    Probiotic Product (CULTURELLE PRO & PREBIOTIC PO), Take 1 tablet by mouth daily., Disp: , Rfl:    SYNTHROID 50 MCG tablet, Take 1 tablet (50 mcg total) by mouth daily. (Patient taking differently: Take 50 mcg by mouth daily before breakfast.), Disp: 30 tablet, Rfl: 11   valsartan (DIOVAN) 320 MG tablet, Take 320 mg by mouth at bedtime. , Disp: , Rfl:   No Known Allergies        Objective:  Physical Exam  General: AAO x3,  NAD  Dermatological: Skin is warm, dry and supple bilateral. There are no open sores, no preulcerative lesions, no rash or signs of infection present.  Vascular: Dorsalis Pedis artery and Posterior Tibial artery pedal pulses are 2/4 bilateral with immedate capillary fill time.  There is no pain with calf compression, swelling, warmth, erythema.   Neruologic: Grossly intact via light touch bilateral.  Negative Tinel sign.  Musculoskeletal: Tenderness to palpation along the plantar medial tubercle of the calcaneus at the insertion of plantar fascia on the right foot. There is no pain along the course of the plantar fascia within the arch of the foot. Plantar fascia appears to be intact. There is no pain with lateral compression of the calcaneus or pain with vibratory sensation. There is no pain along the course or insertion of the achilles tendon. No other areas of tenderness to bilateral lower extremities. Muscular strength 5/5 in all groups tested bilateral.  Gait: Unassisted, Nonantalgic.        Assessment:   Right heel pain, Plantar fasciitis     Plan:  -Treatment options discussed including all alternatives, risks, and complications -Etiology of symptoms were discussed -X-rays were obtained and reviewed with the patient.  3 views of the right foot were obtained.  No evidence of acute fracture.  Significant arthritic changes present the right first MPJ. -Steroid injection performed.  See procedure note below. -Discussed stretching, icing daily.  Discussed shoe modifications and good arch supports.  Procedure: Injection Tendon/Ligament Discussed alternatives, risks, complications and verbal consent was obtained.  Location: Right plantar fascia at the glabrous junction; medial approach. Skin Prep: Alcohol. Injectate: 0.5cc 0.5% marcaine plain, 0.5 cc 2% lidocaine plain and, 1 cc kenalog 10. Disposition: Patient tolerated procedure well. Injection site dressed with a band-aid.   Post-injection care was discussed and return precautions discussed.   Return in about 6 weeks (around 01/24/2022).  Trula Slade DPM

## 2021-12-13 NOTE — Progress Notes (Signed)
Primary Care Physician:  Asencion Noble, MD Primary Gastroenterologist:  Dr. Gala Romney  Pre-Procedure History & Physical: HPI:  Eileen Green is a 71 y.o. female here for for follow-up of GERD and dyspepsia.  Since going on Dexilant her symptoms have improved dramatically.  She is not having any dysphagia.  She feels that her GI symptoms are stress related.  She notes that her daily routine is dictated by having to babysit her granddaughter.  She complains to me that soon twins will arrive and she will have 3 granddaughters to take care 1 regular basis.  Bowel function has been good.  Negative colonoscopy back in 2015; due for screening 2025. Occasional MiraLAX use for constipation.  Past Medical History:  Diagnosis Date   Arthritis    Cancer ([place])    skin melanoma under left eye   Deaf    LEFT EAR   GERD (gastroesophageal reflux disease)    Hyperlipidemia Deaf in Left ear   Hypertension    Hypothyroidism     Past Surgical History:  Procedure Laterality Date   ABDOMINAL HYSTERECTOMY  2001   Partial   CHOLECYSTECTOMY N/A 04/21/2020   Procedure: LAPAROSCOPIC CHOLECYSTECTOMY;  Surgeon: Aviva Signs, MD;  Location: [facility];  Service: General;  Laterality: N/A;   COLONOSCOPY  01/21/2003   [id number] rectum/ Long capacious colon, moderate prep made the exam more challenging.   COLONOSCOPY  05/18/2009   Dr. Gala Romney: Normal rectum/narrow mouth left sided diverticula    COLONOSCOPY N/A 05/14/2013   Dr. Gala Romney: hemorrhoids, colonic diverticula, redudant colon   ESOPHAGOGASTRODUODENOSCOPY  01/21/2003   RMR:  Normal esophagus, stomach, D1 and D2   ESOPHAGOGASTRODUODENOSCOPY N/A 03/24/2014   Dr. Gala Romney :Hiatal hernia; otherwise negative EGD   ESOPHAGOGASTRODUODENOSCOPY N/A 06/27/2019   Procedure: ESOPHAGOGASTRODUODENOSCOPY (EGD);  Surgeon: Daneil Dolin, MD;  [facility];  Service: Endoscopy;  Laterality: N/A;  7:30am   TONSILLECTOMY     TOTAL HIP ARTHROPLASTY Right 05/02/2019    Procedure: RIGHT TOTAL HIP ARTHROPLASTY ANTERIOR APPROACH;  Surgeon: Mcarthur Rossetti, MD;  [facility];  Service: Orthopedics;  Laterality: Right;    Prior to Admission medications   Medication Sig Start Date End Date Taking? Authorizing Provider  amLODipine (NORVASC) 5 MG tablet Take 5 mg by mouth daily.    [provider]  Carboxymethylcellulose Sod PF (THERATEARS PF) 0.25 % SOLN 1 dropperette as needed by ophthalmic route.    [provider]  Cholecalciferol (VITAMIN D3) 50 MCG (2000 UT) capsule Take 2,000 Units by mouth daily.    [provider]  dexlansoprazole (DEXILANT) 60 MG capsule Take 1 capsule (60 mg total) by mouth daily. 08/17/21   Keyondre Hepburn, Cristopher Estimable, MD  ezetimibe (ZETIA) 10 MG tablet TAKE 1 TABLET(10 MG) BY MOUTH DAILY 10/31/21   Hilty, Nadean Corwin, MD  Glucosamine-Chondroitin (OSTEO BI-FLEX REGULAR STRENGTH PO) Take 1 tablet by mouth daily.     [provider]  Methylcellulose, Laxative, 500 MG TABS Take 1,000 mg by mouth daily.     [provider]  Multiple Vitamins-Minerals (ONE DAILY WOMENS 50 PLUS PO) Take 0.5 tablets by mouth See admin instructions. Mon, Wed Fri and Sat  Centrum Silver 50+    [provider]  polyethylene glycol (MIRALAX / GLYCOLAX) packet Take 17 g by mouth daily.    [provider]  Probiotic Product (CULTURELLE PRO & PREBIOTIC PO) Take 1 tablet by mouth daily.    [provider]  SYNTHROID 50 MCG tablet  Take 1 tablet (50 mcg total) by mouth daily. Patient taking differently: Take 50 mcg by mouth daily before breakfast. 06/04/13   Armandina Gemma, MD  valsartan (DIOVAN) 320 MG tablet Take 320 mg by mouth at bedtime.  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[employer], Brewing technologist in math  Tobacco Use   Smoking status: Never   Smokeless tobacco: Never   Tobacco comments:    Never smoked  Vaping Use   Vaping Use: Never used  Substance and Sexual Activity   Alcohol use: No   Drug use: No   Sexual activity: Yes  Other Topics Concern   Not on file  Social History Narrative   Not on file   Social Determinants of Health   Financial Resource Strain: Not on file  Food Insecurity: Not on file  Transportation Needs: Not on file  Physical Activity: Not on file  Stress: Not on file  Social Connections: Not on file  Intimate Partner Violence: Not on file    Review of Systems: See HPI, otherwise negative ROS  Physical Exam: There were no vitals taken for this visit. General:   Alert,  Well-developed, well-nourished, pleasant and cooperative in NAD Lungs:  Clear throughout to auscultation.   No wheezes, crackles, or rhonchi. No acute distress. Heart:  Regular rate and rhythm; no murmurs, clicks, rubs,  or gallops. Abdomen: Non-distended, normal bowel sounds.  Soft and nontender without appreciable mass or hepatosplenomegaly.  Pulses:  Normal pulses noted. Extremities:  Without clubbing or edema.  Impression/Plan: 71 year old lady with well-controlled GERD.  Her abdominal pain has lessened as well.  Constipation not an issue these days.  She realizes that situational stress can make her GI symptoms worse.  She will be due for a screening colonoscopy in 2025.  Recommendations:  GERD information provided  Continue Dexilant 60 mg daily best taken 30 minutes before breakfast  Dispense (either 30 or 90) additional refills x 12 months  Screening  colonoscopy 2016  Office visit here in 1 year and as needed.      Notice: This dictation was prepared with Dragon dictation along with smaller phrase technology. Any transcriptional errors that result from this process are unintentional and may not be corrected upon review.

## 2021-12-13 NOTE — Patient Instructions (Signed)
For sandals I like oofos, vionic   Plantar Fasciitis (Heel Spur Syndrome) with Rehab The plantar fascia is a fibrous, ligament-like, soft-tissue structure that spans the bottom of the foot. Plantar fasciitis is a condition that causes pain in the foot due to inflammation of the tissue. SYMPTOMS  Pain and tenderness on the underneath side of the foot. Pain that worsens with standing or walking. CAUSES  Plantar fasciitis is caused by irritation and injury to the plantar fascia on the underneath side of the foot. Common mechanisms of injury include: Direct trauma to bottom of the foot. Damage to a small nerve that runs under the foot where the main fascia attaches to the heel bone. Stress placed on the plantar fascia due to bone spurs. RISK INCREASES WITH:  Activities that place stress on the plantar fascia (running, jumping, pivoting, or cutting). Poor strength and flexibility. Improperly fitted shoes. Tight calf muscles. Flat feet. Failure to warm-up properly before activity. Obesity. PREVENTION Warm up and stretch properly before activity. Allow for adequate recovery between workouts. Maintain physical fitness: Strength, flexibility, and endurance. Cardiovascular fitness. Maintain a health body weight. Avoid stress on the plantar fascia. Wear properly fitted shoes, including arch supports for individuals who have flat feet.  PROGNOSIS  If treated properly, then the symptoms of plantar fasciitis usually resolve without surgery. However, occasionally surgery is necessary.  RELATED COMPLICATIONS  Recurrent symptoms that may result in a chronic condition. Problems of the lower back that are caused by compensating for the injury, such as limping. Pain or weakness of the foot during push-off following surgery. Chronic inflammation, scarring, and partial or complete fascia tear, occurring more often from repeated injections.  TREATMENT  Treatment initially involves the use of ice  and medication to help reduce pain and inflammation. The use of strengthening and stretching exercises may help reduce pain with activity, especially stretches of the Achilles tendon. These exercises may be performed at home or with a therapist. Your caregiver may recommend that you use heel cups of arch supports to help reduce stress on the plantar fascia. Occasionally, corticosteroid injections are given to reduce inflammation. If symptoms persist for greater than 6 months despite non-surgical (conservative), then surgery may be recommended.   MEDICATION  If pain medication is necessary, then nonsteroidal anti-inflammatory medications, such as aspirin and ibuprofen, or other minor pain relievers, such as acetaminophen, are often recommended. Do not take pain medication within 7 days before surgery. Prescription pain relievers may be given if deemed necessary by your caregiver. Use only as directed and only as much as you need. Corticosteroid injections may be given by your caregiver. These injections should be reserved for the most serious cases, because they may only be given a certain number of times.  HEAT AND COLD Cold treatment (icing) relieves pain and reduces inflammation. Cold treatment should be applied for 10 to 15 minutes every 2 to 3 hours for inflammation and pain and immediately after any activity that aggravates your symptoms. Use ice packs or massage the area with a piece of ice (ice massage). Heat treatment may be used prior to performing the stretching and strengthening activities prescribed by your caregiver, physical therapist, or athletic trainer. Use a heat pack or soak the injury in warm water.  SEEK IMMEDIATE MEDICAL CARE IF: Treatment seems to offer no benefit, or the condition worsens. Any medications produce adverse side effects.  EXERCISES- RANGE OF MOTION (ROM) AND STRETCHING EXERCISES - Plantar Fasciitis (Heel Spur Syndrome) These exercises may help you  when  beginning to rehabilitate your injury. Your symptoms may resolve with or without further involvement from your physician, physical therapist or athletic trainer. While completing these exercises, remember:  Restoring tissue flexibility helps normal motion to return to the joints. This allows healthier, less painful movement and activity. An effective stretch should be held for at least 30 seconds. A stretch should never be painful. You should only feel a gentle lengthening or release in the stretched tissue.  RANGE OF MOTION - Toe Extension, Flexion Sit with your right / left leg crossed over your opposite knee. Grasp your toes and gently pull them back toward the top of your foot. You should feel a stretch on the bottom of your toes and/or foot. Hold this stretch for 10 seconds. Now, gently pull your toes toward the bottom of your foot. You should feel a stretch on the top of your toes and or foot. Hold this stretch for 10 seconds. Repeat  times. Complete this stretch 3 times per day.   RANGE OF MOTION - Ankle Dorsiflexion, Active Assisted Remove shoes and sit on a chair that is preferably not on a carpeted surface. Place right / left foot under knee. Extend your opposite leg for support. Keeping your heel down, slide your right / left foot back toward the chair until you feel a stretch at your ankle or calf. If you do not feel a stretch, slide your bottom forward to the edge of the chair, while still keeping your heel down. Hold this stretch for 10 seconds. Repeat 3 times. Complete this stretch 2 times per day.   STRETCH  Gastroc, Standing Place hands on wall. Extend right / left leg, keeping the front knee somewhat bent. Slightly point your toes inward on your back foot. Keeping your right / left heel on the floor and your knee straight, shift your weight toward the wall, not allowing your back to arch. You should feel a gentle stretch in the right / left calf. Hold this position for 10  seconds. Repeat 3 times. Complete this stretch 2 times per day.  STRETCH  Soleus, Standing Place hands on wall. Extend right / left leg, keeping the other knee somewhat bent. Slightly point your toes inward on your back foot. Keep your right / left heel on the floor, bend your back knee, and slightly shift your weight over the back leg so that you feel a gentle stretch deep in your back calf. Hold this position for 10 seconds. Repeat 3 times. Complete this stretch 2 times per day.  STRETCH  Gastrocsoleus, Standing  Note: This exercise can place a lot of stress on your foot and ankle. Please complete this exercise only if specifically instructed by your caregiver.  Place the ball of your right / left foot on a step, keeping your other foot firmly on the same step. Hold on to the wall or a rail for balance. Slowly lift your other foot, allowing your body weight to press your heel down over the edge of the step. You should feel a stretch in your right / left calf. Hold this position for 10 seconds. Repeat this exercise with a slight bend in your right / left knee. Repeat 3 times. Complete this stretch 2 times per day.   STRENGTHENING EXERCISES - Plantar Fasciitis (Heel Spur Syndrome)  These exercises may help you when beginning to rehabilitate your injury. They may resolve your symptoms with or without further involvement from your physician, physical therapist or athletic trainer.  While completing these exercises, remember:  Muscles can gain both the endurance and the strength needed for everyday activities through controlled exercises. Complete these exercises as instructed by your physician, physical therapist or athletic trainer. Progress the resistance and repetitions only as guided.  STRENGTH - Towel Curls Sit in a chair positioned on a non-carpeted surface. Place your foot on a towel, keeping your heel on the floor. Pull the towel toward your heel by only curling your toes. Keep your  heel on the floor. Repeat 3 times. Complete this exercise 2 times per day.  STRENGTH - Ankle Inversion Secure one end of a rubber exercise band/tubing to a fixed object (table, pole). Loop the other end around your foot just before your toes. Place your fists between your knees. This will focus your strengthening at your ankle. Slowly, pull your big toe up and in, making sure the band/tubing is positioned to resist the entire motion. Hold this position for 10 seconds. Have your muscles resist the band/tubing as it slowly pulls your foot back to the starting position. Repeat 3 times. Complete this exercises 2 times per day.  Document Released: 12/19/2004 Document Revised: 03/13/2011 Document Reviewed: 04/02/2008 [facility] Patient Information 2014 [place], [place].

## 2021-12-13 NOTE — Patient Instructions (Signed)
It was good to see you again today!  GERD information provided  Continue Dexilant 60 mg daily best taken 30 minutes before breakfast  Dispense (either 30 or 90) additional refills x 12 months  Screening colonoscopy 2016  Office visit here in 1 year and as needed.

## 2022-01-17 ENCOUNTER — Ambulatory Visit: Payer: Federal, State, Local not specified - PPO | Admitting: Podiatry

## 2022-01-30 ENCOUNTER — Ambulatory Visit (INDEPENDENT_AMBULATORY_CARE_PROVIDER_SITE_OTHER): Payer: Federal, State, Local not specified - PPO | Admitting: Podiatry

## 2022-01-30 DIAGNOSIS — M722 Plantar fascial fibromatosis: Secondary | ICD-10-CM

## 2022-01-30 NOTE — Progress Notes (Signed)
Subjective: Chief Complaint  Patient presents with   Plantar Fasciitis    6 week f/u, some days are better than others     72 year old female presents with the above concerns.  She states that symptoms are present but she become more observant on things that may be causing pain in her symptoms. At times she thinks it is better then at times it hurts. If she is standing, the weight is on the right foot. Wears night splint at night and brace during the day.  No recent injury or changes pus.  Objective: AAO x3, NAD DP/PT pulses palpable bilaterally, CRT less than 3 seconds There is still tenderness to palpation on the plantar medial tubercle of the calcaneus at the insertion of the plantar fascia on the right foot.  Today there is no pain in the course of the plantar fascia the arch of the foot or with lateral compression of calcaneus.  No pain with Achilles tendon.  Flexor, extensor tendons are intact.   Arthritic changes present the first MPJ.   MMT 5/5. No pain with calf compression, swelling, warmth, erythema  Assessment: 72 year old female with plantar fasciitis  Plan: -All treatment options discussed with the patient including all alternatives, risks, complications.  -We discussed continued stretching, icing on regular basis.  Discussed that she has night splint even while sitting down during the day.  Continue shoes with good arch supports and being consistent.  He is on days where she is converted to be consistent with this until the pain is resolved for some time. -Offered steroid injection. -Anti-inflammatories as needed. -Patient encouraged to call the office with any questions, concerns, change in symptoms.   Trula Slade DPM

## 2022-01-30 NOTE — Patient Instructions (Signed)

## 2022-02-05 DIAGNOSIS — M722 Plantar fascial fibromatosis: Secondary | ICD-10-CM | POA: Insufficient documentation

## 2022-02-15 LAB — LIPID PANEL
Chol/HDL Ratio: 3.8 ratio (ref 0.0–4.4)
Cholesterol, Total: 210 mg/dL — ABNORMAL HIGH (ref 100–199)
HDL: 55 mg/dL (ref >39)
LDL Chol Calc (NIH): 138 mg/dL — ABNORMAL HIGH (ref 0–99)
Triglycerides: 93 mg/dL (ref 0–149)
VLDL Cholesterol Cal: 17 mg/dL (ref 5–40)

## 2022-02-16 ENCOUNTER — Telehealth: Payer: Self-pay | Admitting: Internal Medicine

## 2022-02-16 ENCOUNTER — Encounter: Payer: Self-pay | Admitting: Emergency Medicine

## 2022-02-16 DIAGNOSIS — E785 Hyperlipidemia, unspecified: Secondary | ICD-10-CM

## 2022-02-16 NOTE — Telephone Encounter (Signed)
Pt called to follow up with lab results, she has some questions.

## 2022-02-16 NOTE — Telephone Encounter (Signed)
Returning pt's call- she had some questions about her recent lab results. Tried home and mobile. Left message and call back number.   Message sent yesterday to pt:  Off rosuvastatin, the LDL cholesterol has increased, as expected. Is the leg pain better?   Please let us know - call or send MyChart message   Thanks, Clarnce Flock., RN (Dr. Debara Pickett)

## 2022-02-16 NOTE — Telephone Encounter (Signed)
Pt states that she is no longer having any leg pain. She wants to know if she needs to be back on a medication to lower it? Are there any other options, other than the medication that caused the leg pain to decrease cholesterol? She is wondering if it is ok to still eat eggs, what type of sugar/substitutes would you suggest? Diet changes, etc...  Told her that I would send her some information about diet changes, exercise and I would also send this to her provider for suggestions.

## 2022-02-16 NOTE — Telephone Encounter (Signed)
Patient is returning RN's call. Please advise. 

## 2022-02-22 MED ORDER — ATORVASTATIN CALCIUM 20 MG PO TABS: 20.0000 mg | ORAL_TABLET | Freq: Every day | ORAL | 3 refills | Status: DC

## 2022-02-22 NOTE — Telephone Encounter (Signed)
Pixie Casino, MD  You; Jeanene Erb, Luiz Ochoa, RN6 minutes ago (2:19 PM)    Would she be willing to trial another statin, such as low dose pravastatin 20 mg daily? In addition to the zetia - if so, repeat lipid in 3 months after starting  Dr Lemmie Evens

## 2022-02-22 NOTE — Telephone Encounter (Signed)
Pixie Casino, MD  Fidel Levy, RN Caller: Unspecified (6 days ago, 10:38 AM) [place] .Marland Kitchen Then we can try low dose atorvastatin 20 mg daily.

## 2022-02-24 NOTE — Addendum Note (Signed)
Addended by: Sharee Holster on: 02/24/2022 03:00 PM   Modules accepted: Orders

## 2022-02-27 ENCOUNTER — Other Ambulatory Visit: Payer: Self-pay | Admitting: Internal Medicine

## 2022-04-03 ENCOUNTER — Ambulatory Visit (INDEPENDENT_AMBULATORY_CARE_PROVIDER_SITE_OTHER): Payer: Federal, State, Local not specified - PPO

## 2022-04-03 ENCOUNTER — Telehealth: Payer: Self-pay | Admitting: Internal Medicine

## 2022-04-03 ENCOUNTER — Ambulatory Visit
Admission: EM | Admit: 2022-04-03 | Discharge: 2022-04-03 | Disposition: A | Discharge status: Home / Self Care | Payer: Federal, State, Local not specified - PPO

## 2022-04-03 ENCOUNTER — Other Ambulatory Visit: Payer: Self-pay

## 2022-04-03 DIAGNOSIS — R051 Acute cough: Secondary | ICD-10-CM

## 2022-04-03 DIAGNOSIS — R079 Chest pain, unspecified: Secondary | ICD-10-CM

## 2022-04-03 DIAGNOSIS — R0789 Other chest pain: Secondary | ICD-10-CM

## 2022-04-03 NOTE — Discharge Instructions (Signed)
Your exam, EKG and x-ray were very reassuring today.  You may try the remedies that we discussed to see if any of those help with your symptoms.  Otherwise follow-up with your primary care for a recheck if not resolving.

## 2022-04-03 NOTE — ED Triage Notes (Signed)
Pt reports dry cough x 3 weeks; epigastric pain and dull pain in the left arm on and off x 1 week.

## 2022-04-03 NOTE — ED Provider Notes (Signed)
[facility]    CSN: [id number] Arrival date & time: 04/03/22  1500      History   Chief Complaint Chief Complaint  Patient presents with   Cough    HPI Eileen Green is a 72 y.o. female.   Patient presenting today with several week history of dry cough, intermittent bilateral lower chest soreness and epigastric pain for the past week or so.  Denies wheezing, shortness of breath, nausea, vomiting, diarrhea, fevers.  States she was treated with antibiotics several weeks ago for some nasal drainage by PCP.  Called her cardiologist earlier today who advised she come in and be evaluated to rule out cardiac cause of her chest pain.  She does have a past medical history significant for hypertension, hyperlipidemia, hiatal hernia.  Not taking any medications over-the-counter for the symptoms currently.  States she was taking some NyQuil last week but stopped it in case it was making things worse.    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   Pre-operative clearance 03/31/2019   Family history of coronary artery disease 03/31/2019   Dysphagia 03/05/2019   Abdominal pain, epigastric 03/05/2019   Unilateral primary osteoarthritis, right hip 08/14/2018   Epigastric discomfort 12/29/2016   Dyslipidemia 01/13/2016   Murmur 01/13/2016   Essential hypertension 01/13/2016   Hiatal hernia    Dyspepsia 03/18/2014   RLQ abdominal pain 08/08/2013   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   COLONOSCOPY  05/18/2009   Dr. Gala Romney: Normal rectum/narrow mouth left sided diverticula    COLONOSCOPY N/A 05/14/2013   Dr. Gala Romney: hemorrhoids, colonic diverticula, redudant colon   ESOPHAGOGASTRODUODENOSCOPY  01/21/2003   RMR:  Normal esophagus, stomach, D1 and D2   ESOPHAGOGASTRODUODENOSCOPY N/A 03/24/2014   Dr. Gala Romney :Hiatal hernia; otherwise negative EGD   ESOPHAGOGASTRODUODENOSCOPY N/A 06/27/2019   Procedure: ESOPHAGOGASTRODUODENOSCOPY (EGD);  Surgeon: Daneil Dolin, MD;  Location: [facility];  Service: Endoscopy;  Laterality: N/A;  7:30am   TONSILLECTOMY     TOTAL HIP ARTHROPLASTY Right 05/02/2019   Procedure: RIGHT TOTAL HIP ARTHROPLASTY ANTERIOR APPROACH;  Surgeon: Mcarthur Rossetti, MD;  Location: [facility];  Service: Orthopedics;  Laterality: Right;    OB History   No obstetric history on file.      Home Medications    Prior to Admission medications   Medication Sig Start Date End Date Taking? Authorizing Provider  ATORVASTATIN CALCIUM PO Take by mouth.   Yes [provider]  Cholecalciferol (VITAMIN D3) 1.25 MG (50000 UT) TABS Take by mouth.   Yes [provider]  DEXLANSOPRAZOLE PO Take by mouth.   Yes [provider]  ezetimibe (ZETIA) 10 MG tablet Take 10 mg by mouth daily.   Yes [provider]  Lactobacillus-Inulin (CULTURELLE ADULT ULT BALANCE PO) Take by mouth.   Yes [provider]  amLODipine (NORVASC) 5 MG tablet Take 5 mg by mouth daily.    [provider]  atorvastatin (LIPITOR) 20 MG tablet Take 1 tablet (20 mg total) by mouth  daily. 02/22/22 02/17/23  Pixie Casino, MD  Carboxymethylcellulose Sod PF (THERATEARS PF) 0.25 % SOLN 1 dropperette as needed by ophthalmic route.    [provider]  Cholecalciferol (VITAMIN D3) 50 MCG (2000 UT) capsule Take 2,000 Units by mouth daily.    [provider]  dexlansoprazole (DEXILANT) 60 MG capsule Take 1 capsule (60 mg total) by mouth daily. 12/13/21   Rourk, Cristopher Estimable, MD  ezetimibe (ZETIA) 10 MG tablet TAKE 1 TABLET(10 MG) BY MOUTH DAILY 02/27/22   Hilty, Nadean Corwin, MD  Glucosamine-Chondroitin (OSTEO BI-FLEX REGULAR STRENGTH PO) Take 1 tablet by mouth daily.     [provider]  ketoconazole (NIZORAL) 2 % cream Apply topically 2 (two) times daily. 12/06/21   [provider]  meloxicam (MOBIC) 7.5 MG tablet Take 1 tablet (7.5 mg total) by mouth daily as needed for pain. 12/13/21   Trula Slade, DPM  Methylcellulose, Laxative, 500 MG TABS Take 1,000 mg by mouth daily.     [provider]  Multiple Vitamins-Minerals (ONE DAILY WOMENS 50 PLUS PO) Take 0.5 tablets by mouth See admin instructions. Mon, Wed Fri and Sat  Centrum Silver 50+    [provider]  polyethylene glycol (MIRALAX / GLYCOLAX) packet Take 17 g by mouth daily.    [provider]  Probiotic Product (CULTURELLE PRO & PREBIOTIC PO) Take 1 tablet by mouth daily.    [provider]  SYNTHROID 50 MCG tablet Take 1 tablet (50 mcg total) by mouth daily. Patient taking differently: Take 50 mcg by mouth daily before breakfast. 06/04/13   Armandina Gemma, MD  triamcinolone (KENALOG) 0.025 % ointment Apply 1 Application topically 2 (two) times daily. 12/13/21   Trula Slade, DPM  valsartan (DIOVAN) 320 MG tablet Take 320 mg by mouth at bedtime.  09/08/18   [provider]    Family History Family History  Problem Relation Age of Onset   Heart disease Mother 82   Dementia Mother    Kidney disease Father    Thyroid nodules Father    Colon  cancer Neg Hx     Social History Social History   Tobacco Use   Smoking status: Never   Smokeless tobacco: Never   Tobacco comments:    Never smoked  Vaping Use   Vaping Use: Never used  Substance Use Topics   Alcohol use: No   Drug use: No     Allergies   Crestor [rosuvastatin]   Review of Systems Review of Systems Per HPI  Physical Exam Triage Vital Signs ED Triage Vitals [04/03/22 1702]  Enc Vitals Group     BP (!) 159/84     Pulse Rate 97     Resp 16     Temp 97.9 F (36.6 C)     Temp Source Oral     SpO2 97 %     Weight      Height      Head Circumference      Peak Flow  Pain Score 4     Pain Loc      Pain Edu?      Excl. in [place]?    No data found.  Updated Vital Signs BP (!) 159/84 (BP Location: Right Arm)   Pulse 97   Temp 97.9 F (36.6 C) (Oral)   Resp 16   SpO2 97%   Visual Acuity Right Eye Distance:   Left Eye Distance:   Bilateral Distance:    Right Eye Near:   Left Eye Near:    Bilateral Near:     Physical Exam Vitals and nursing note reviewed.  Constitutional:      Appearance: Normal appearance. She is not ill-appearing.  HENT:     Head: Atraumatic.     Nose: Nose normal.     Mouth/Throat:     Mouth: Mucous membranes are moist.     Pharynx: Oropharynx is clear.  Eyes:     Extraocular Movements: Extraocular movements intact.     Conjunctiva/sclera: Conjunctivae normal.  Cardiovascular:     Rate and Rhythm: Normal rate and regular rhythm.     Heart sounds: Normal heart sounds.  Pulmonary:     Effort: Pulmonary effort is normal. No respiratory distress.     Breath sounds: Normal breath sounds. No wheezing or rales.  Musculoskeletal:        General: Normal range of motion.     Cervical back: Normal range of motion and neck supple.  Skin:    General: Skin is warm and dry.  Neurological:     Mental Status: She is alert and oriented to person, place, and time.  Psychiatric:        Mood and Affect: Mood normal.         Thought Content: Thought content normal.        Judgment: Judgment normal.      UC Treatments / Results  Labs (all labs ordered are listed, but only abnormal results are displayed) Labs Reviewed - No data to display  EKG   Radiology DG Chest 2 View  Result Date: 04/03/2022 CLINICAL DATA:  Chest tightness EXAM: CHEST - 2 VIEW COMPARISON:  09/11/2017 FINDINGS: The heart size and mediastinal contours are within normal limits. Both lungs are clear. Multilevel degenerative osteophytes. IMPRESSION: No active cardiopulmonary disease. Electronically Signed   By: Donavan Foil M.D.   On: 04/03/2022 17:52    Procedures Procedures (including critical care time)  Medications Ordered in UC Medications - No data to display  Initial Impression / Assessment and Plan / UC Course  I have reviewed the triage vital signs and the nursing notes.  Pertinent labs & imaging results that were available during my care of the patient were reviewed by me and considered in my medical decision making (see chart for details).     Minimally hypertensive in triage, otherwise vital signs benign and reassuring.  She is very well-appearing and in no acute distress.  Her EKG today shows normal sinus rhythm at 84 bpm with no acute ST or T wave changes.  Her chest x-ray today shows no acute cardiopulmonary abnormalities and her exam revealed no abnormal findings.  Possibly anxiety versus some seasonal allergy inflammation versus reflux from her hiatal hernia.  Discussed supportive over-the-counter medications, home care and return precautions were all.  Return for worsening symptoms.  Final Clinical Impressions(s) / UC Diagnoses   Final diagnoses:  Chest tightness  Acute cough     Discharge Instructions      Your  exam, EKG and x-ray were very reassuring today.  You may try the remedies that we discussed to see if any of those help with your symptoms.  Otherwise follow-up with your primary care for a recheck if  not resolving.    ED Prescriptions   None    PDMP not reviewed this encounter.   Volney American, [place] 04/03/22 [phone]

## 2022-04-03 NOTE — Telephone Encounter (Signed)
  Pt c/o of Chest Pain: STAT if CP now or developed within 24 hours  1. Are you having CP right now? Chest discomfort  2. Are you experiencing any other symptoms (ex. SOB, nausea, vomiting, sweating)?   3. How long have you been experiencing CP? Last 2 days   4. Is your CP continuous or coming and going? Comes and goes   5. Have you taken Nitroglycerin? No  ?   Pt said, she's been having Chest cold for the last 2 days and giving her chest discomfort. She can feel the chest discomfort right now and want to know if she should need to go to urgent care or see Dr. Debara Pickett

## 2022-04-03 NOTE — Telephone Encounter (Signed)
Spoke with patient.  She is having active chest pain and is in the parking lot at Urgent Care.   She says it could be multiple things and  I agree, but she should go ahead and go inside and be worked up to rule out cardiac issues.  She wil go in now.

## 2022-05-09 ENCOUNTER — Encounter: Payer: Self-pay | Admitting: Internal Medicine

## 2022-05-09 ENCOUNTER — Other Ambulatory Visit: Payer: Self-pay

## 2022-05-09 DIAGNOSIS — E785 Hyperlipidemia, unspecified: Secondary | ICD-10-CM

## 2022-05-27 LAB — LIPID PANEL
Chol/HDL Ratio: 2.4 ratio (ref 0.0–4.4)
Cholesterol, Total: 130 mg/dL (ref 100–199)
HDL: 55 mg/dL (ref >39)
LDL Chol Calc (NIH): 57 mg/dL (ref 0–99)
Triglycerides: 94 mg/dL (ref 0–149)
VLDL Cholesterol Cal: 18 mg/dL (ref 5–40)

## 2022-10-06 DIAGNOSIS — N819 Female genital prolapse, unspecified: Secondary | ICD-10-CM | POA: Insufficient documentation

## 2022-10-06 NOTE — Progress Notes (Unsigned)
Name: Eileen Green DOB: 12-Jun-1950 MRN: [id number]  History of Present Illness: Eileen Green is a 72 y.o. female who presents today as a new patient at [facility]. All available relevant medical records have been reviewed.  - GU / GYN history includes: 1. Previous supracervical hysterectomy with retropubic suspension and bladder and posterior repair.  2. Prolapse of uterine stump along with cystocele.  3. History of a left ovarian cyst with resolution.   Today: She reports that in April 2024 she has had intermittent external vaginal pain after urinating; denies dysuria. Has previously tried antibiotics as prescribed by her GYN for possible UTIs but denies any improvement with that. She denies history of recurrent UTI.  She denies increased urinary urgency, frequency, gross hematuria, hesitancy, straining to void, or sensations of incomplete emptying.   Reports seeing a bit of blood when wiping after urinating today. She reports a sensation of vaginal bulge which is described as feeling like the size of a golfball. She denies vaginal pain or abnormal discharge.  Denies constipation; states she is currently having small "explosive" bowel movements routinely. She also reports occasional bilateral lower abdominal pain. She reports that she has taken Miralax "for years - I don't really know why". She is also taking a daily fiber supplement. She is followed by GI.    Fall Screening: Do you usually have a device to assist in your mobility? No   Medications: Current Outpatient Medications  Medication Sig Dispense Refill   amLODipine (NORVASC) 5 MG tablet Take 5 mg by mouth daily.     atorvastatin (LIPITOR) 20 MG tablet Take 1 tablet (20 mg total) by mouth daily. 90 tablet 3   Carboxymethylcellulose Sod PF (THERATEARS PF) 0.25 % SOLN 1 dropperette as needed by ophthalmic route.     Cholecalciferol (VITAMIN D3) 50 MCG (2000 UT) capsule Take 2,000 Units by mouth daily.      dexlansoprazole (DEXILANT) 60 MG capsule Take 1 capsule (60 mg total) by mouth daily. 30 capsule 11   estradiol (ESTRACE) 0.1 MG/GM vaginal cream Discard plastic applicator. Insert a blueberry size amount (approximately 1 gram) of cream on fingertip inside vagina at bedtime every night for 1 week then every other night for long term use. 30 g 3   ezetimibe (ZETIA) 10 MG tablet Take 10 mg by mouth daily.     Glucosamine-Chondroitin (OSTEO BI-FLEX REGULAR STRENGTH PO) Take 1 tablet by mouth daily.      Lactobacillus-Inulin (CULTURELLE ADULT ULT BALANCE PO) Take by mouth.     Methylcellulose, Laxative, 500 MG TABS Take 1,000 mg by mouth daily.      Multiple Vitamins-Minerals (ONE DAILY WOMENS 50 PLUS PO) Take 0.5 tablets by mouth See admin instructions. Mon, Wed Fri and Sat  Centrum Silver 50+     polyethylene glycol (MIRALAX / GLYCOLAX) packet Take 17 g by mouth daily.     Probiotic Product (CULTURELLE PRO & PREBIOTIC PO) Take 1 tablet by mouth daily.     valsartan (DIOVAN) 320 MG tablet Take 320 mg by mouth at bedtime.      No current facility-administered medications for this visit.    Allergies: Allergies  Allergen Reactions   Crestor [Rosuvastatin] Other (See Comments)    Myalgias, leg pain    Past Medical History:  Diagnosis Date   Arthritis    Cancer (HCC)    skin melanoma under left eye   Deaf    LEFT EAR   GERD (gastroesophageal reflux disease)  Hyperlipidemia Deaf in Left ear   Hypertension    Hypothyroidism    Past Surgical History:  Procedure Laterality Date   ABDOMINAL HYSTERECTOMY  2001   Partial   CHOLECYSTECTOMY N/A 04/21/2020   Procedure: LAPAROSCOPIC CHOLECYSTECTOMY;  Surgeon: Franky Macho, MD;  Location: [facility];  Service: General;  Laterality: N/A;   COLONOSCOPY  01/21/2003   FAO:ZHYQMV rectum/ Long capacious colon, moderate prep made the exam more challenging.   COLONOSCOPY  05/18/2009   Dr. Jena Gauss: Normal rectum/narrow mouth left sided diverticula     COLONOSCOPY N/A 05/14/2013   Dr. Jena Gauss: hemorrhoids, colonic diverticula, redudant colon   ESOPHAGOGASTRODUODENOSCOPY  01/21/2003   RMR:  Normal esophagus, stomach, D1 and D2   ESOPHAGOGASTRODUODENOSCOPY N/A 03/24/2014   Dr. Jena Gauss :Hiatal hernia; otherwise negative EGD   ESOPHAGOGASTRODUODENOSCOPY N/A 06/27/2019   Procedure: ESOPHAGOGASTRODUODENOSCOPY (EGD);  Surgeon: Corbin Ade, MD;  Location: [facility];  Service: Endoscopy;  Laterality: N/A;  7:30am   TONSILLECTOMY     TOTAL HIP ARTHROPLASTY Right 05/02/2019   Procedure: RIGHT TOTAL HIP ARTHROPLASTY ANTERIOR APPROACH;  Surgeon: Kathryne Hitch, MD;  Location: [facility];  Service: Orthopedics;  Laterality: Right;   Family History  Problem Relation Age of Onset   Heart disease Mother 32   Dementia Mother    Kidney disease Father    Thyroid nodules Father    Colon cancer Neg Hx    Social History   Socioeconomic History   Marital status: Married    Spouse name: Not on file   Number of children: Not on file   Years of education: Not on file   Highest education level: Not on file  Occupational History   Occupation: retired    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[employer], Education administrator in math  Tobacco Use   Smoking status: Never   Smokeless tobacco: Never   Tobacco comments:    Never smoked  Vaping Use   Vaping status: Never Used  Substance and Sexual Activity   Alcohol use: No   Drug use: No   Sexual activity: Yes  Other Topics Concern   Not on file  Social History Narrative   Not on file   Social Determinants of Health   Financial Resource Strain: Not on file  Food Insecurity: Not on file  Transportation Needs: Not on file  Physical Activity: Not on file  Stress: Not on file  Social Connections: Not on file  Intimate Partner Violence: Not on file    SUBJECTIVE  Review of Systems Constitutional: Patient denies any unintentional weight loss or change in strength lntegumentary: Patient denies any rashes or  pruritus Cardiovascular: Patient denies chest pain or syncope Respiratory: Patient denies shortness of breath Gastrointestinal: As per HPI Musculoskeletal: Patient denies muscle cramps or weakness Neurologic: Patient denies convulsions or seizures Allergic/Immunologic: Patient denies recent allergic reaction(s) Hematologic/Lymphatic: Patient denies bleeding tendencies Endocrine: Patient denies heat/cold intolerance  GU: As per HPI.  OBJECTIVE Vitals:   10/12/22 0944  BP: 129/74  Pulse: 74  Temp: 98 F (36.7 C)   There is no height or weight on file to calculate BMI.  Physical Examination Constitutional: No obvious distress; patient is non-toxic appearing  Cardiovascular: No visible lower extremity edema.  Respiratory: The patient does not have audible wheezing/stridor; respirations do not appear labored  Gastrointestinal: Abdomen non-distended Musculoskeletal: Normal ROM of UEs  Skin: No obvious rashes/open sores  Neurologic: CN 2-12 grossly intact Psychiatric: Answered questions appropriately with normal affect  Hematologic/Lymphatic/Immunologic: No obvious bruises or  sites of spontaneous bleeding  UA: negative  PVR: 30 ml  ASSESSMENT Vaginal irritation - Plan: BLADDER SCAN AMB NON-IMAGING, Urinalysis, Routine w reflex microscopic, estradiol (ESTRACE) 0.1 MG/GM vaginal cream  Atrophic vaginitis - Plan: estradiol (ESTRACE) 0.1 MG/GM vaginal cream  Female genital prolapse, unspecified type - Plan: BLADDER SCAN AMB NON-IMAGING, Urinalysis, Routine w reflex microscopic, estradiol (ESTRACE) 0.1 MG/GM vaginal cream  No acute urologic findings. We discussed that her external vulvovaginal discomfort after voiding is most likely due to vaginal atrophy.   The etiology and consequences of urogenital epithelial atrophy was explained to patient. The thinning of the epithelium of the urethra can contribute to urinary urgency and frequency syndromes. In addition, the normal bacterial  flora that colonizes the perineum may contribute to UTI risk because the thin urethral epithelium allows the bacteria to become adherent and the change in vaginal pH can disrupt the vaginal / urethral microbiome and allow for bacterial overgrowth.  Patient was advised that: - Topical vaginal estrogen replacement will take about 3 months to restore the vaginal pH. - Topical vaginal estrogen replacement may sting/burn initially due to severe dryness, which will improve with ongoing treatment.  We discussed that there have been studies that evaluate use of low-dose intravaginal estrogen cream that shows minimal systemic absorption that is negligible after 3 weeks. There have been no studies indicating increased risk of contributing to breast cancer development or recurrence.  Patient elected to start topical vaginal estrogen cream every other night. She was advised to follow up with her PCP, GYN, and GI providers for non-GU concerns. She was reassured today that her most recent renal labs were normal per chart review.  We agreed to follow up on an as-needed basis. Pt verbalized understanding and agreement. All questions were answered.  PLAN Advised the following: Start topical vaginal estrogen cream use every other night. Return if symptoms worsen or fail to improve.  Orders Placed This Encounter  Procedures   Urinalysis, Routine w reflex microscopic   BLADDER SCAN AMB NON-IMAGING   It has been explained that the patient is to follow regularly with their PCP in addition to all other providers involved in their care and to follow instructions provided by these respective offices. Patient advised to contact urology clinic if any urologic-pertaining questions, concerns, new symptoms or problems arise in the interim period.  Patient Instructions  Comparison of Fiber Supplements The best way to consume 35 grams of fiber per day is through a healthy diet, but that may not always be possible. Fiber  supplements are available without a prescription to help increase fiber consumption.  Comparison of powder fiber brands:   Metamucil Citrucel Benefiber Fibersure   Active ingredient Psyllium husk Methylcellulos e Wheat dextrin lnulin   Is active* natural? Natural Semi-synthetic Natural Natural   FDA approval for laxation? Yes Yes No No   Grams of active/day for laxation 2.5-30 grams 4-6 grams Not approved as laxative Not approved as laxative   Amount of active/dose 3.4 grams 2 grams 3 grams 5 grams   Required # of doses/day for laxation 1 2 Not approved as laxative Not approved as laxative   Soluble fiber/ Insoluble fiber? 70% soluble 100% soluble 100% soluble 100% soluble   Active holds water? Yes Yes No No    Active forms a gel? Yes No No No  Active bulks stools? Yes Yes No No  tive traps bile acids? Yes No No Inadequate data  Active is fermentable? Partially No Yes Yes  Helps  lower blood cholesterol? Yes Minimally No No  Helps lower blood pressure? Yes Inadequate data Inadequate data Inadequate data  Helps lower blood sugar? Yes Inadequate Yes No data  Helps lower the risk of heart disease? Yes Inadequate data Inadequate data Inadequate data    Comparison of solid dose fiber brands:   Metamucil Citrucel FiberCon FiberChoice    Capsules Swallowable Swallowable      Caplets tablets    Active Psyllium husk Methylcellulose Calcium lnulin   ingredient   Polycarbophil    Is active* Natural Semi-synthetic Synthetic Natural   natural?       FDA approval Yes Yes Yes No   for laxation?       Grams of 2.5-30 grams 4-6 grams 4-6 grams 5 grams   active/day for       laxation       Amount of 0.525 grams 0.5 0.625 2 grams/tablet   active/dose /capsule= 5 grams/caplet = grams/caplet = 2 tablets/dose    capsules/dose 2 caplets/dose =2       caplets/dose    Required # of 1 4 4  n/a   doses/day for       laxation       Required # of 5 8 8  n/a   caplets/day for       laxation        Soluble fiber/ 70% soluble 100% soluble 100% soluble 100% soluble   Insoluble fiber?        Active holds water? Yes Yes Yes No  Active forms a gel? Yes No Yes No  Active bulks stools? Yes Yes Yes No  Active traps bile acids? Yes Yes Inadequate data Inadequate data  Active is fermentable? Partially No No Yes  Helps lower blood cholesterol? Yes Minimally No No    *Active refers to the active ingredient in the supplement. Resource: www.nationalfibercouncil.org   Electronically signed by:  Donnita Falls, MSN, FNP-C, CUNP 10/12/2022 11:00 AM

## 2022-10-12 ENCOUNTER — Encounter: Payer: Self-pay | Admitting: Urology

## 2022-10-12 ENCOUNTER — Ambulatory Visit: Payer: Federal, State, Local not specified - PPO | Admitting: Urology

## 2022-10-12 VITALS — BP 129/74 | HR 74 | Temp 98.0°F

## 2022-10-12 DIAGNOSIS — N819 Female genital prolapse, unspecified: Secondary | ICD-10-CM

## 2022-10-12 DIAGNOSIS — N898 Other specified noninflammatory disorders of vagina: Secondary | ICD-10-CM

## 2022-10-12 DIAGNOSIS — N952 Postmenopausal atrophic vaginitis: Secondary | ICD-10-CM

## 2022-10-12 DIAGNOSIS — R300 Dysuria: Secondary | ICD-10-CM

## 2022-10-12 LAB — URINALYSIS, ROUTINE W REFLEX MICROSCOPIC
Bilirubin, UA: NEGATIVE
Glucose, UA: NEGATIVE
Ketones, UA: NEGATIVE
Leukocytes,UA: NEGATIVE
Nitrite, UA: NEGATIVE
Protein,UA: NEGATIVE
RBC, UA: NEGATIVE
Specific Gravity, UA: 1.015 (ref 1.005–1.030)
Urobilinogen, Ur: 1 mg/dL (ref 0.2–1.0)
pH, UA: 7 (ref 5.0–7.5)

## 2022-10-12 LAB — BLADDER SCAN AMB NON-IMAGING: Scan Result: 30

## 2022-10-12 MED ORDER — ESTRADIOL 0.1 MG/GM VA CREA: TOPICAL_CREAM | VAGINAL | 3 refills | Status: AC

## 2022-10-12 NOTE — Patient Instructions (Signed)
Comparison of Fiber Supplements The best way to consume 35 grams of fiber per day is through a healthy diet, but that may not always be possible. Fiber supplements are available without a prescription to help increase fiber consumption.  Comparison of powder fiber brands:   Metamucil Citrucel Benefiber Fibersure   Active ingredient Psyllium husk Methylcellulos e Wheat dextrin lnulin   Is active* natural? Natural Semi-synthetic Natural Natural   FDA approval for laxation? Yes Yes No No   Grams of active/day for laxation 2.5-30 grams 4-6 grams Not approved as laxative Not approved as laxative   Amount of active/dose 3.4 grams 2 grams 3 grams 5 grams   Required # of doses/day for laxation 1 2 Not approved as laxative Not approved as laxative   Soluble fiber/ Insoluble fiber? 70% soluble 100% soluble 100% soluble 100% soluble   Active holds water? Yes Yes No No    Active forms a gel? Yes No No No  Active bulks stools? Yes Yes No No  tive traps bile acids? Yes No No Inadequate data  Active is fermentable? Partially No Yes Yes  Helps lower blood cholesterol? Yes Minimally No No  Helps lower blood pressure? Yes Inadequate data Inadequate data Inadequate data  Helps lower blood sugar? Yes Inadequate Yes No data  Helps lower the risk of heart disease? Yes Inadequate data Inadequate data Inadequate data    Comparison of solid dose fiber brands:   Metamucil Citrucel FiberCon FiberChoice    Capsules Swallowable Swallowable      Caplets tablets    Active Psyllium husk Methylcellulose Calcium lnulin   ingredient   Polycarbophil    Is active* Natural Semi-synthetic Synthetic Natural   natural?       FDA approval Yes Yes Yes No   for laxation?       Grams of 2.5-30 grams 4-6 grams 4-6 grams 5 grams   active/day for       laxation       Amount of 0.525 grams 0.5 0.625 2 grams/tablet   active/dose /capsule= 5 grams/caplet = grams/caplet = 2 tablets/dose    capsules/dose 2  caplets/dose =2       caplets/dose    Required # of 1 4 4  n/a   doses/day for       laxation       Required # of 5 8 8  n/a   caplets/day for       laxation       Soluble fiber/ 70% soluble 100% soluble 100% soluble 100% soluble   Insoluble fiber?        Active holds water? Yes Yes Yes No  Active forms a gel? Yes No Yes No  Active bulks stools? Yes Yes Yes No  Active traps bile acids? Yes Yes Inadequate data Inadequate data  Active is fermentable? Partially No No Yes  Helps lower blood cholesterol? Yes Minimally No No    *Active refers to the active ingredient in the supplement. Resource: www.nationalfibercouncil.org

## 2022-11-06 NOTE — Progress Notes (Signed)
Cardiology Office Note:    Date:  11/17/2022   ID:  Eileen Green, DOB 11-04-1950, MRN [id number]  PCP:  Carylon Perches, MD  Cardiologist:  Chrystie Nose, MD     Referring MD: Carylon Perches, MD   Chief Complaint: follow-up of chest pain  History of Present Illness:    Eileen Green is a 72 y.o. female with a history of atypical chest pain with essentially normal coronaries on coronary CTA in 10/2020, hypertension, hyperlipidemia, hypothyroidism, and GERD who is followed by Dr. Rennis Golden and presents today for routine follow-up.   Patient has a history of atypical chest pain. She has had multiple ischemic evaluations in the past. ETT in 01/2016 and Lexiscan Myoview in 04/2019 were both negative for ischemia. Coronary CTA in 10/2020 showed a coronary calcium score of 0 with mild proximal narrowing of the RCA without discrete plaque.   She was last seen by Dr. Rennis Golden in 11/2021 at which time she reported some occasional throat pain and pain behind her right ear as well as some leg pain but no real claudication. There was concern that this could be a side effect to her statin. Therefore, she was advised to stop her Crestor. She was subsequently started on Lipitor instead.   Patient presents today for follow-up. Here alone. Patient reports occasional mild chest pain. She had difficulty describing this but evually states it just felt like an ache. She said it occurred rarely but after further clarification she said it occurs about once a week. It occurs at rest and it not brought on or made worse by exertion. She states the discomfort can last hours and then resolves. She thinks it may be due to reflux or chest congestion as most recent episode occurred when she forgot to take her reflux medications and prior episodes have improved with Mucinex. She states this is the same type of chest pain that she has been having for years (including when she had the coronary CTA) in 2022 and that symptoms are not getting  any worse or occurring more frequently. She is not very concerned about this. She denies any shortness of breath, orthopnea, PND, or edema. She reports rare episodes of very brief palpitations but no significant symptoms. No lightheadedness, dizziness, or syncope. Leg pain improved off the Crestor and she is doing better on the Lipitor.   EKGs/Labs/Other Studies Reviewed:    The following studies were reviewed:  Coronary CTA 10/22/2020: Impressions: 1. Coronary calcium score of 0. This was 1st percentile for age, sex, and race matched control. 2. Normal coronary origin with right dominance. 3. CAD-RADS 1. 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 Consider non-atherosclerotic causes of chest pain. Consider preventive therapy and risk factor modification. 4. Aortic atherosclerosis.  EKG:  EKG ordered today.   EKG Interpretation Date/Time:  Friday November 17 2022 10:36:10 EST Ventricular Rate:  64 PR Interval:  148 QRS Duration:  80 QT Interval:  382 QTC Calculation: 394 R Axis:   58  Text Interpretation: Normal sinus rhythm with sinus arrhythmia Normal ECG When compared with ECG of 03-Apr-2022 16:58, No significant change was found Confirmed by Marjie Skiff [phone]) on 11/17/2022 11:07:55 AM    Recent Labs: No results found for requested labs within last 365 days.  Recent Lipid Panel    Component Value Date/Time   CHOL 130 05/26/2022 0810   TRIG 94 05/26/2022 0810   HDL 55 05/26/2022 0810   CHOLHDL 2.4 05/26/2022 0810   CHOLHDL 3.3 07/26/2016 0805  VLDL 20 07/26/2016 0805   LDLCALC 57 05/26/2022 0810    Physical Exam:    Vital Signs: BP 122/70 (BP Location: Left Arm, Patient Position: Sitting, Cuff Size: Normal)   Pulse 72   Ht 5\' 5"  (1.651 m)   Wt 183 lb 12.8 oz (83.4 kg)   SpO2 97%   BMI 30.59 kg/m     Wt Readings from Last 3 Encounters:  11/17/22 183 lb 12.8 oz (83.4 kg)  12/13/21 186 lb 9.6 oz (84.6 kg)  11/14/21 186 lb 3.2 oz (84.5 kg)     General: 72  y.o. Caucasian female in no acute distress. HEENT: Normocephalic and atraumatic. Sclera clear.  Neck: Supple. No carotid bruits. No JVD. Heart: RRR. Distinct S1 and S2. No murmurs, gallops, or rubs.  Lungs: No increased work of breathing. Clear to ausculation bilaterally. No wheezes, rhonchi, or rales.  Extremities: No lower extremity edema.   Skin: Warm and dry. Neuro: No focal deficits. Psych: Normal affect. Responds appropriately.   Assessment:    1. Atypical chest pain   2. Essential hypertension   3. Hyperlipidemia, unspecified hyperlipidemia type     Plan:    History of Chest Pain Patient has a history of intermittent chest pain. She has had multiple negative ischemic evaluations in the past. Coronary CTA in 10/2020 showed a coronary calcium score of 0 with mild proximal narrowing of the RCA without discrete plaque.  - She continues to have intermittent atypical chest pain that she states is unchanged from the pain she has had for years. - Continue statin.  - Symptoms sounds very atypical and I really do not think it is cardiac in nature. I offered repeat ischemic evaluation given persistent symptoms but she declined which I think is very reasonable since symptoms are non-exertional and unchanged and EKG shows no significant changes.   Hypertension BP well controlled.  - Continue Amlodipine 5mg  daily.  - Continue Valsartan 320mg  daily.   Hyperlipidemia Last lipid panel in 05/2022: Total Cholesterol 130, Triglycerides 94, HDL 55, LDL 57.  - Continue Lipitor 20mg  daily and Zetia 10mg  daily.   Disposition: Follow up in 6 months.    Signed, Corrin Parker, PA-C  11/17/2022 1:02 PM    [facility]

## 2022-11-14 ENCOUNTER — Encounter: Payer: Self-pay | Admitting: Internal Medicine

## 2022-11-17 ENCOUNTER — Encounter: Payer: Self-pay | Admitting: Student

## 2022-11-17 ENCOUNTER — Ambulatory Visit: Payer: Federal, State, Local not specified - PPO | Attending: Student | Admitting: Student

## 2022-11-17 VITALS — BP 122/70 | HR 72 | Ht 65.0 in | Wt 183.8 lb

## 2022-11-17 DIAGNOSIS — E785 Hyperlipidemia, unspecified: Secondary | ICD-10-CM

## 2022-11-17 DIAGNOSIS — R0789 Other chest pain: Secondary | ICD-10-CM | POA: Diagnosis not present

## 2022-11-17 DIAGNOSIS — I10 Essential (primary) hypertension: Secondary | ICD-10-CM | POA: Diagnosis not present

## 2022-11-17 NOTE — Patient Instructions (Signed)
Medication Instructions:  Continue same medications *If you need a refill on your cardiac medications before your next appointment, please call your pharmacy*   Lab Work: None ordered   Testing/Procedures: None ordered   Follow-Up: At [facility], you and your health needs are our priority.  As part of our continuing mission to provide you with exceptional heart care, we have created designated Provider Care Teams.  These Care Teams include your primary Cardiologist (physician) and Advanced Practice Providers (APPs -  Physician Assistants and Nurse Practitioners) who all work together to provide you with the care you need, when you need it.  We recommend signing up for the patient portal called "MyChart".  Sign up information is provided on this After Visit Summary.  MyChart is used to connect with patients for Virtual Visits (Telemedicine).  Patients are able to view lab/test results, encounter notes, upcoming appointments, etc.  Non-urgent messages can be sent to your provider as well.   To learn more about what you can do with MyChart, go to [email or web address].    Your next appointment:  6 months   Call in Feb to schedule May appointment    Provider:  Marjie Skiff PA

## 2022-12-05 ENCOUNTER — Ambulatory Visit: Payer: Federal, State, Local not specified - PPO | Admitting: Internal Medicine

## 2022-12-05 ENCOUNTER — Encounter: Payer: Self-pay | Admitting: Internal Medicine

## 2022-12-05 VITALS — BP 129/75 | HR 80 | Temp 97.9°F | Ht 65.0 in | Wt 184.4 lb

## 2022-12-05 DIAGNOSIS — R0789 Other chest pain: Secondary | ICD-10-CM | POA: Diagnosis not present

## 2022-12-05 DIAGNOSIS — K219 Gastro-esophageal reflux disease without esophagitis: Secondary | ICD-10-CM | POA: Diagnosis not present

## 2022-12-05 DIAGNOSIS — Z8719 Personal history of other diseases of the digestive system: Secondary | ICD-10-CM

## 2022-12-05 NOTE — Progress Notes (Unsigned)
Primary Care Physician:  Carylon Perches, MD Primary Gastroenterologist:  Dr.   Pre-Procedure History & Physical: HPI:  Eileen Green is a 72 y.o. female here for follow-up constipation and GERD.  GERD symptoms well-controlled on Dexilant 60 mg daily, taking before breakfast.  Intermittent chest pain with movement.  Not affected by eating or exertion.  Seen cardiology  - felt not to be cardiac in origin.  Throughout the encounter she describes the challenges of taking care of her 3 very young grandchildren  -  gets little help from other family members.  Cooks a big Sunday dinner for everybody every week.  No reciprocation.  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  Does not often get a break.  Today she is stressed out. She does not have any nausea or vomiting no dysphagia. No rectal bleeding.  EGD 2021 small hiatal hernia. Sees Dr. Ouida Sills about every 4 months for high blood pressure; recommended famotidine nightly on top of Dexilant.  Patient states she took it for short period of time can tell no difference and subsequently stopped it..  Earlier this year, she saw GYN for atrophic vaginitis given hormone cream. Also, saw urology for back pain.  Past Medical History:  Diagnosis Date   Arthritis    Cancer (HCC)    skin melanoma under left eye   Deaf    LEFT EAR   GERD (gastroesophageal reflux disease)    Hyperlipidemia Deaf in Left ear   Hypertension    Hypothyroidism     Past Surgical History:  Procedure Laterality Date   ABDOMINAL HYSTERECTOMY  2001   Partial   CHOLECYSTECTOMY N/A 04/21/2020   Procedure: LAPAROSCOPIC CHOLECYSTECTOMY;  Surgeon: Franky Macho, MD;  Location: [facility];  Service: General;  Laterality: N/A;   COLONOSCOPY  01/21/2003   [id number] rectum/ Long capacious colon, moderate prep made the exam more challenging.   COLONOSCOPY  05/18/2009   Dr. Jena Gauss: Normal rectum/narrow mouth left sided diverticula    COLONOSCOPY N/A 05/14/2013    Dr. Jena Gauss: hemorrhoids, colonic diverticula, redudant colon   ESOPHAGOGASTRODUODENOSCOPY  01/21/2003   RMR:  Normal esophagus, stomach, D1 and D2   ESOPHAGOGASTRODUODENOSCOPY N/A 03/24/2014   Dr. Jena Gauss :Hiatal hernia; otherwise negative EGD   ESOPHAGOGASTRODUODENOSCOPY N/A 06/27/2019   Procedure: ESOPHAGOGASTRODUODENOSCOPY (EGD);  Surgeon: Corbin Ade, MD;  Location: [facility];  Service: Endoscopy;  Laterality: N/A;  7:30am   TONSILLECTOMY     TOTAL HIP ARTHROPLASTY Right 05/02/2019   Procedure: RIGHT TOTAL HIP ARTHROPLASTY ANTERIOR APPROACH;  Surgeon: Kathryne Hitch, MD;  Location: [facility];  Service: Orthopedics;  Laterality: Right;    Prior to Admission medications   Medication Sig Start Date End Date Taking? Authorizing Provider  amLODipine (NORVASC) 5 MG tablet Take 5 mg by mouth daily.   Yes [provider]  atorvastatin (LIPITOR) 20 MG tablet Take 1 tablet (20 mg total) by mouth daily. 02/22/22 02/17/23 Yes Hilty, Lisette Abu, MD  Carboxymethylcellulose Sod PF (THERATEARS PF) 0.25 % SOLN 1 dropperette as needed by ophthalmic route.   Yes [provider]  Cholecalciferol (VITAMIN D3) 50 MCG (2000 UT) capsule Take 2,000 Units by mouth daily.   Yes [provider]  dexlansoprazole (DEXILANT) 60 MG capsule Take 1 capsule (60 mg total) by mouth daily. 12/13/21  Yes Keylan Costabile, Gerrit Friends, MD  estradiol (ESTRACE) 0.1 MG/GM vaginal cream Discard plastic applicator. Insert a blueberry size amount (approximately 1 gram) of cream on fingertip  inside vagina at bedtime every night for 1 week then every other night for long term use. 10/12/22  Yes Donnita Falls, FNP  ezetimibe (ZETIA) 10 MG tablet Take 10 mg by mouth daily.   Yes [provider]  FERROUS SULFATE PO Take 65 mg by mouth daily with breakfast.   Yes [provider]  Glucosamine-Chondroitin (OSTEO BI-FLEX REGULAR STRENGTH PO) Take 1 tablet by mouth daily.    Yes [provider]   levothyroxine (SYNTHROID) 50 MCG tablet Take 50 mcg by mouth daily.   Yes [provider]  Methylcellulose, Laxative, 500 MG TABS Take 1,000 mg by mouth daily.    Yes [provider]  Multiple Vitamins-Minerals (ONE DAILY WOMENS 50 PLUS PO) Take 0.5 tablets by mouth See admin instructions. Mon, Wed Fri and Sat  Centrum Silver 50+   Yes [provider]  polyethylene glycol (MIRALAX / GLYCOLAX) packet Take 17 g by mouth daily.   Yes [provider]  Probiotic Product (CULTURELLE PRO & PREBIOTIC PO) Take 1 tablet by mouth daily.   Yes [provider]  valsartan (DIOVAN) 320 MG tablet Take 320 mg by mouth at bedtime.  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[employer], Education administrator in math  Tobacco Use   Smoking status: Never   Smokeless tobacco: Never   Tobacco comments:    Never smoked  Vaping Use   Vaping status: Never Used  Substance and Sexual Activity   Alcohol use: No   Drug use: No   Sexual activity: Yes  Other Topics Concern   Not on file  Social History Narrative   Not on file   Social Determinants of Health   Financial Resource Strain: Not on file  Food Insecurity: Not on file  Transportation Needs: Not on file  Physical Activity: Not on file  Stress: Not on file  Social Connections: Not on file  Intimate Partner Violence: Not on  file    Review of Systems: See HPI, otherwise negative ROS  Physical Exam: BP 129/75 (BP Location: Right Arm, Patient Position: Sitting, Cuff Size: Large)   Pulse 80   Temp 97.9 F (36.6 C) (Oral)   Ht 5\' 5"  (1.651 m)   Wt 184 lb 6.4 oz (83.6 kg)   SpO2 97%   BMI 30.69 kg/m  General:   Alert,  Well-developed, well-nourished, pleasant and cooperative in NAD Lungs:  Clear throughout to auscultation.   No wheezes, crackles, or rhonchi. No acute distress. Heart:  Regular rate and rhythm; no murmurs, clicks, rubs,  or gallops. Abdomen: Non-distended, normal bowel sounds.  Soft and nontender without appreciable mass or hepatosplenomegaly.    Impression/Plan: 72 year old with longstanding uncomplicated GERD historically well-controlled on Dexilant 60 mg daily.  Intermittent nonexertional Postprandial chest discomfort.  Spends 11 hours every day taking care of 3 young children with lots of lifting. At this time, I do not feel that this intermittent chest discomfort is GI in origin.  It is good to know she had a cardiac evaluation recently and checked out okay.  Lots of free-floating stress -  taking care of other family members. Diverticulosis on screening colonoscopy almost 10 years ago.  She is due for average risk screening at this time.  Recommendations:  Continue Dexilant 60 mg daily 30 minutes before breakfast for reflux  Try reflux Gourmet on [email or web address].  Take take it after meals and as needed.  Continue daily fiber intake.   May continue MiraLAX as needed to facilitate having a bowel movement every day  As discussed, due for a screening colonoscopy-ASA 2 will plan for January 2025  Further recommendations to follow.    Notice: This dictation was prepared with Dragon dictation along with smaller phrase technology. Any transcriptional errors that result from this process are unintentional and may not be corrected upon review.

## 2022-12-05 NOTE — Patient Instructions (Signed)
It was good to see you again today!  You should continue Dexilant 60 mg daily 30 minutes before breakfast for reflux  Try reflux Gourmet on [email or web address].  Take take it after meals and as needed when you feel like you are having indigestion.  Continue your daily fiber intake as you are doing  Would hold off on taking Culturelle probiotic  May continue MiraLAX as needed to facilitate having a bowel movement every day  As discussed, you are due for a screening colonoscopy-ASA 2 will plan for January 2025  Further recommendations to follow.

## 2022-12-07 ENCOUNTER — Other Ambulatory Visit: Payer: Self-pay | Admitting: Internal Medicine

## 2022-12-11 ENCOUNTER — Encounter: Payer: Self-pay | Admitting: *Deleted

## 2022-12-11 ENCOUNTER — Telehealth: Payer: Self-pay | Admitting: *Deleted

## 2022-12-11 ENCOUNTER — Other Ambulatory Visit: Payer: Self-pay | Admitting: *Deleted

## 2022-12-11 MED ORDER — PEG 3350-KCL-NA BICARB-NACL 420 G PO SOLR: 4000.0000 mL | Freq: Once | ORAL | 0 refills | Status: AC

## 2022-12-11 NOTE — Telephone Encounter (Signed)
Pt informed.     Message Received: Today Rourk, Gerrit Friends, MD  Rocco Serene L, LPN   Yes like chewing gum.  You chew it up and it pretty much dissolves.  Not Gummies       Previous Messages    ----- Message ----- From: Elinor Dodge, LPN Sent: 82/09/5619  10:07 AM EST To: Corbin Ade, MD  Pt says she was confused about what you wanted her to get off [employer].  She ordered the reflux Gourmet but ordered chewing gum. She wants to know if it was supposed to be the gum or gummies?  Message Received: Today Rourk, Gerrit Friends, MD  Elinor Dodge, LPN  she ordered the right stuff       Previous Messages    ----- Message ----- From: Elinor Dodge, LPN Sent: 30/08/6576  10:07 AM EST To: Corbin Ade, MD  Pt says she was confused about what you wanted her to get off [employer].  She ordered the reflux Gourmet but ordered chewing gum. She wants to know if it was supposed to be the gum or gummies?

## 2022-12-24 ENCOUNTER — Other Ambulatory Visit: Payer: Self-pay | Admitting: Internal Medicine

## 2022-12-30 ENCOUNTER — Other Ambulatory Visit: Payer: Self-pay | Admitting: Internal Medicine

## 2023-01-01 IMAGING — NM NM HEPATOBILIARY IMAGE, INC GB
4 series · 14 of 14 positions shown · non-contrast
Comparison: None. Findings are correlated with sonogram of
07/02/2019

CLINICAL DATA: Abdominal pain

EXAM:
NUCLEAR MEDICINE HEPATOBILIARY IMAGING
TECHNIQUE: Sequential images of the abdomen were obtained [DATE] minutes
following intravenous administration of radiopharmaceutical.
RADIOPHARMACEUTICALS:  5.2 mCi 0c-11m  Choletec IV

[Series 1: biliary · 3.25mm/px · 6 of 60 frames shown]
[frame 6/60]
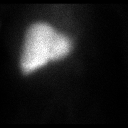
[frame 16/60]
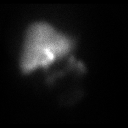
[frame 26/60]
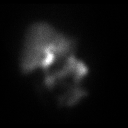
[frame 36/60]
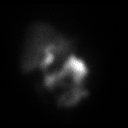
[frame 46/60]
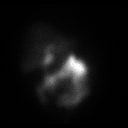
[frame 56/60]
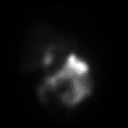

[Series 2: rt lat · 3.25mm/px · 1 of 1 slices shown (1 of 2)]
[im 1/1]
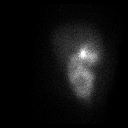

[Series 3: rt lat · 3.25mm/px · 1 of 1 slices shown (2 of 2)]
[im 1/1]
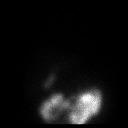

[Series 4: anterior · 3.25mm/px · 6 of 30 frames shown]
[frame 3/30]
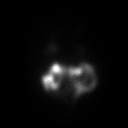
[frame 8/30]
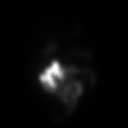
[frame 13/30]
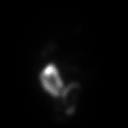
[frame 18/30]
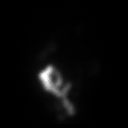
[frame 23/30]
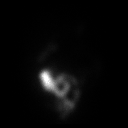
[frame 28/30]
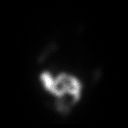

[14 of 14 positions shown; findings below may reference images not displayed]

FINDINGS: Prompt uptake and biliary excretion of activity by the liver is
seen. At 60 minutes, gallbladder activity is not clearly identified
and, therefore, 3.4 mg of morphine was administered intravenously to
induce contraction of the biliary sphincter. Gallbladder activity is
subsequently visualized, consistent with patency of cystic duct.
Biliary activity passes into small bowel, consistent with patent
common bile duct.
IMPRESSION: Patency of the cystic duct. Given morphine administration,
gallbladder ejection fraction could not be calculated on this
examination.

## 2023-01-02 NOTE — Telephone Encounter (Signed)
Pt left vm wanting to know about PA for procedure.  LMTRC

## 2023-01-02 NOTE — Telephone Encounter (Signed)
Pt informed that we check insurance to see if a PA is needed. She can call her insurance to check for benefits. Pt verbalized understanding

## 2023-01-12 ENCOUNTER — Other Ambulatory Visit (HOSPITAL_COMMUNITY): Payer: Federal, State, Local not specified - PPO

## 2023-01-17 NOTE — Anesthesia Preprocedure Evaluation (Addendum)
Anesthesia Evaluation  Patient identified by MRN, date of birth, ID band Patient awake    Reviewed: Allergy & Precautions, H&P , NPO status , Patient's Chart, lab work & pertinent test results, reviewed documented beta blocker date and time   Airway Mallampati: II  TM Distance: >3 FB Neck ROM: full    Dental no notable dental hx. (+) Dental Advisory Given, Teeth Intact   Pulmonary neg pulmonary ROS   Pulmonary exam normal breath sounds clear to auscultation       Cardiovascular Exercise Tolerance: Good hypertension,  Rhythm:regular Rate:Normal     Neuro/Psych Deafness  Neuromuscular disease  negative psych ROS   GI/Hepatic Neg liver ROS, hiatal hernia,GERD  Medicated,,  Endo/Other  Hypothyroidism    Renal/GU negative Renal ROS  negative genitourinary   Musculoskeletal  (+) Arthritis , Osteoarthritis,    Abdominal   Peds  Hematology negative hematology ROS (+)   Anesthesia Other Findings cancer  Reproductive/Obstetrics negative OB ROS                             Anesthesia Physical Anesthesia Plan  ASA: 3  Anesthesia Plan: General   Post-op Pain Management: Minimal or no pain anticipated   Induction: Intravenous  PONV Risk Score and Plan: Propofol infusion  Airway Management Planned: Nasal Cannula and Natural Airway  Additional Equipment: None  Intra-op Plan:   Post-operative Plan:   Informed Consent: I have reviewed the patients History and Physical, chart, labs and discussed the procedure including the risks, benefits and alternatives for the proposed anesthesia with the patient or authorized representative who has indicated his/her understanding and acceptance.     Dental Advisory Given  Plan Discussed with: CRNA  Anesthesia Plan Comments:         Anesthesia Quick Evaluation

## 2023-01-18 ENCOUNTER — Ambulatory Visit (HOSPITAL_COMMUNITY)
Admission: RE | Admit: 2023-01-18 | Discharge: 2023-01-18 | Disposition: A | Discharge status: Home / Self Care | Payer: Federal, State, Local not specified - PPO | Attending: Internal Medicine | Admitting: Internal Medicine

## 2023-01-18 ENCOUNTER — Encounter (HOSPITAL_COMMUNITY)
Admission: RE | Disposition: A | Discharge status: Home / Self Care | Payer: Self-pay | Source: Home / Self Care | Attending: Internal Medicine

## 2023-01-18 ENCOUNTER — Ambulatory Visit (HOSPITAL_COMMUNITY): Payer: Self-pay | Admitting: Anesthesiology

## 2023-01-18 ENCOUNTER — Encounter (HOSPITAL_COMMUNITY): Payer: Self-pay | Admitting: Internal Medicine

## 2023-01-18 ENCOUNTER — Other Ambulatory Visit: Payer: Self-pay

## 2023-01-18 DIAGNOSIS — K5730 Diverticulosis of large intestine without perforation or abscess without bleeding: Secondary | ICD-10-CM | POA: Insufficient documentation

## 2023-01-18 DIAGNOSIS — I10 Essential (primary) hypertension: Secondary | ICD-10-CM | POA: Insufficient documentation

## 2023-01-18 DIAGNOSIS — E039 Hypothyroidism, unspecified: Secondary | ICD-10-CM | POA: Diagnosis not present

## 2023-01-18 DIAGNOSIS — K219 Gastro-esophageal reflux disease without esophagitis: Secondary | ICD-10-CM | POA: Insufficient documentation

## 2023-01-18 DIAGNOSIS — Z1211 Encounter for screening for malignant neoplasm of colon: Secondary | ICD-10-CM | POA: Insufficient documentation

## 2023-01-18 DIAGNOSIS — Z79890 Hormone replacement therapy (postmenopausal): Secondary | ICD-10-CM | POA: Diagnosis not present

## 2023-01-18 HISTORY — PX: COLONOSCOPY WITH PROPOFOL: SHX5780

## 2023-01-18 SURGERY — COLONOSCOPY WITH PROPOFOL
Anesthesia: General

## 2023-01-18 MED ORDER — PHENYLEPHRINE 80 MCG/ML (10ML) SYRINGE FOR IV PUSH (FOR BLOOD PRESSURE SUPPORT)
PREFILLED_SYRINGE | INTRAVENOUS | Status: AC
  Filled 2023-01-18: qty 10

## 2023-01-18 MED ORDER — LACTATED RINGERS IV SOLN: INTRAVENOUS | Status: DC | PRN

## 2023-01-18 MED ORDER — PROPOFOL 500 MG/50ML IV EMUL
INTRAVENOUS | Status: DC | PRN
Start: 2023-01-18 — End: 2023-01-18
  Administered 2023-01-18: 150 ug/kg/min via INTRAVENOUS

## 2023-01-18 MED ORDER — LIDOCAINE HCL (PF) 2 % IJ SOLN
INTRAMUSCULAR | Status: DC | PRN
  Administered 2023-01-18: 50 mg via INTRADERMAL

## 2023-01-18 MED ORDER — SODIUM CHLORIDE 0.9% FLUSH: 3.0000 mL | Freq: Two times a day (BID) | INTRAVENOUS | Status: DC

## 2023-01-18 MED ORDER — PROPOFOL 10 MG/ML IV BOLUS
INTRAVENOUS | Status: DC | PRN
  Administered 2023-01-18: 100 mg via INTRAVENOUS

## 2023-01-18 MED ORDER — SODIUM CHLORIDE 0.9% FLUSH: 3.0000 mL | INTRAVENOUS | Status: DC | PRN

## 2023-01-18 MED ORDER — PHENYLEPHRINE 80 MCG/ML (10ML) SYRINGE FOR IV PUSH (FOR BLOOD PRESSURE SUPPORT)
PREFILLED_SYRINGE | INTRAVENOUS | Status: DC | PRN
  Administered 2023-01-18: 80 ug via INTRAVENOUS

## 2023-01-18 NOTE — Anesthesia Procedure Notes (Signed)
Date/Time: 01/18/2023 7:30 AM  Performed by: Julian Reil, CRNAPre-anesthesia Checklist: Patient identified, Emergency Drugs available, Suction available and Patient being monitored Patient Re-evaluated:Patient Re-evaluated prior to induction Oxygen Delivery Method: Nasal cannula Induction Type: IV induction Placement Confirmation: positive ETCO2

## 2023-01-18 NOTE — H&P (Signed)
@LOGO @   Primary Care Physician:  Carylon Perches, MD Primary Gastroenterologist:  Dr. Jena Gauss  Pre-Procedure History & Physical: HPI:  Eileen Green is a 73 y.o. female is here for a screening colonoscopy.  Diverticulosis on colonoscopy 10 years ago.  No bowel symptoms.  Here for average risk screening  Past Medical History:  Diagnosis Date   Arthritis    Cancer (HCC)    skin melanoma under left eye   Deaf    LEFT EAR   GERD (gastroesophageal reflux disease)    Hyperlipidemia Deaf in Left ear   Hypertension    Hypothyroidism     Past Surgical History:  Procedure Laterality Date   ABDOMINAL HYSTERECTOMY  2001   Partial   CHOLECYSTECTOMY N/A 04/21/2020   Procedure: LAPAROSCOPIC CHOLECYSTECTOMY;  Surgeon: Franky Macho, MD;  Location: [facility];  Service: General;  Laterality: N/A;   COLONOSCOPY  01/21/2003   [id number] rectum/ Long capacious colon, moderate prep made the exam more challenging.   COLONOSCOPY  05/18/2009   Dr. Jena Gauss: Normal rectum/narrow mouth left sided diverticula    COLONOSCOPY N/A 05/14/2013   Dr. Jena Gauss: hemorrhoids, colonic diverticula, redudant colon   ESOPHAGOGASTRODUODENOSCOPY  01/21/2003   RMR:  Normal esophagus, stomach, D1 and D2   ESOPHAGOGASTRODUODENOSCOPY N/A 03/24/2014   Dr. Jena Gauss :Hiatal hernia; otherwise negative EGD   ESOPHAGOGASTRODUODENOSCOPY N/A 06/27/2019   Procedure: ESOPHAGOGASTRODUODENOSCOPY (EGD);  Surgeon: Corbin Ade, MD;  Location: [facility];  Service: Endoscopy;  Laterality: N/A;  7:30am   TONSILLECTOMY     TOTAL HIP ARTHROPLASTY Right 05/02/2019   Procedure: RIGHT TOTAL HIP ARTHROPLASTY ANTERIOR APPROACH;  Surgeon: Kathryne Hitch, MD;  Location: [facility];  Service: Orthopedics;  Laterality: Right;    Prior to Admission medications   Medication Sig Start Date End Date Taking? Authorizing Provider  amLODipine (NORVASC) 5 MG tablet Take 5 mg by mouth daily.   Yes [provider]  atorvastatin (LIPITOR) 20 MG tablet  Take 1 tablet (20 mg total) by mouth daily. 02/22/22 02/17/23 Yes Hilty, Lisette Abu, MD  Carboxymethylcellulose Sod PF (THERATEARS PF) 0.25 % SOLN 1 dropperette as needed by ophthalmic route.   Yes [provider]  Cholecalciferol (VITAMIN D3) 50 MCG (2000 UT) capsule Take 2,000 Units by mouth daily.   Yes [provider]  dexlansoprazole (DEXILANT) 60 MG capsule TAKE 1 CAPSULE(60 MG) BY MOUTH DAILY 12/08/22  Yes Blayke Cordrey, Gerrit Friends, MD  estradiol (ESTRACE) 0.1 MG/GM vaginal cream Discard plastic applicator. Insert a blueberry size amount (approximately 1 gram) of cream on fingertip inside vagina at bedtime every night for 1 week then every other night for long term use. 10/12/22  Yes Donnita Falls, FNP  ezetimibe (ZETIA) 10 MG tablet TAKE 1 TABLET(10 MG) BY MOUTH DAILY 01/01/23  Yes Hilty, Lisette Abu, MD  Glucosamine-Chondroitin (OSTEO BI-FLEX REGULAR STRENGTH PO) Take 1 tablet by mouth daily.    Yes [provider]  levothyroxine (SYNTHROID) 50 MCG tablet Take 50 mcg by mouth daily.   Yes [provider]  Methylcellulose, Laxative, 500 MG TABS Take 1,000 mg by mouth daily.    Yes [provider]  Multiple Vitamins-Minerals (ONE DAILY WOMENS 50 PLUS PO) Take 0.5 tablets by mouth See admin instructions. Mon, Wed Fri and Sat  Centrum Silver 50+   Yes [provider]  polyethylene glycol (MIRALAX / GLYCOLAX) packet Take 17 g by mouth daily.   Yes [provider]  valsartan (DIOVAN) 320 MG tablet Take 320 mg by  mouth at bedtime.  09/08/18  Yes [provider]  FERROUS SULFATE PO Take 65 mg by mouth daily with breakfast.    [provider]  Probiotic Product (CULTURELLE PRO & PREBIOTIC PO) Take 1 tablet by mouth daily.    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[employer], Education administrator in math  Tobacco Use   Smoking status: Never   Smokeless tobacco: Never   Tobacco comments:    Never smoked  Vaping Use   Vaping status: Never Used  Substance and Sexual Activity   Alcohol use: No   Drug use: No   Sexual activity: Yes  Other Topics Concern   Not on file  Social History Narrative   Not on file   Social Drivers of Health   Financial Resource Strain: Not on file  Food Insecurity: Not on file  Transportation Needs: Not on file  Physical Activity: Not on file  Stress: Not on file  Social Connections: Not on file  Intimate Partner Violence: Not on file    Review of Systems: See HPI, otherwise negative ROS  Physical Exam: BP (!) 147/77   Pulse 73   Temp 97.7 F (36.5 C) (Oral)   Resp 17   Ht 5\' 5"  (1.651 m)   Wt 81.2 kg   SpO2 95%   BMI 29.79 kg/m  General:   Alert,  Well-developed, well-nourished, pleasant and cooperative in NAD ckles, or rhonchi. No acute distress. Heart:  Regular rate and rhythm; no murmurs, clicks, rubs,  or gallops. Abdomen:  Soft, nontender and nondistended. No masses, hepatosplenomegaly or hernias noted. Normal bowel sounds, without guarding, and without rebound.    Impression/Plan: Eileen Green is now here to undergo a screening colonoscopy.  Average risk screening examination  Risks, benefits, limitations, imponderables and alternatives regarding colonoscopy have been reviewed with the patient. Questions have been answered. All parties agreeable.     Notice:  This dictation was  prepared with Dragon dictation along with smaller phrase technology. Any transcriptional errors that result from this process are unintentional and may not be corrected upon review.

## 2023-01-18 NOTE — Discharge Instructions (Signed)
  Colonoscopy Discharge Instructions  Read the instructions outlined below and refer to this sheet in the next few weeks. These discharge instructions provide you with general information on caring for yourself after you leave the hospital. Your doctor may also give you specific instructions. While your treatment has been planned according to the most current medical practices available, unavoidable complications occasionally occur. If you have any problems or questions after discharge, call Dr. Jena Gauss at [phone]. ACTIVITY You may resume your regular activity, but move at a slower pace for the next 24 hours.  Take frequent rest periods for the next 24 hours.  Walking will help get rid of the air and reduce the bloated feeling in your belly (abdomen).  No driving for 24 hours (because of the medicine (anesthesia) used during the test).   Do not sign any important legal documents or operate any machinery for 24 hours (because of the anesthesia used during the test).  NUTRITION Drink plenty of fluids.  You may resume your normal diet as instructed by your doctor.  Begin with a light meal and progress to your normal diet. Heavy or fried foods are harder to digest and may make you feel sick to your stomach (nauseated).  Avoid alcoholic beverages for 24 hours or as instructed.  MEDICATIONS You may resume your normal medications unless your doctor tells you otherwise.  WHAT YOU CAN EXPECT TODAY Some feelings of bloating in the abdomen.  Passage of more gas than usual.  Spotting of blood in your stool or on the toilet paper.  IF YOU HAD POLYPS REMOVED DURING THE COLONOSCOPY: No aspirin products for 7 days or as instructed.  No alcohol for 7 days or as instructed.  Eat a soft diet for the next 24 hours.  FINDING OUT THE RESULTS OF YOUR TEST Not all test results are available during your visit. If your test results are not back during the visit, make an appointment with your caregiver to find out the  results. Do not assume everything is normal if you have not heard from your caregiver or the medical facility. It is important for you to follow up on all of your test results.  SEEK IMMEDIATE MEDICAL ATTENTION IF: You have more than a spotting of blood in your stool.  Your belly is swollen (abdominal distention).  You are nauseated or vomiting.  You have a temperature over 101.  You have abdominal pain or discomfort that is severe or gets worse throughout the day.    No polyps found today !  A future colonoscopy is not recommended for screening purposes  At patient request, called Salam Alphonso [phone] findings and recommendations

## 2023-01-18 NOTE — Op Note (Signed)
[facility] Patient Name: Eileen Green Procedure Date: 01/18/2023 7:11 AM MRN: [id number] Date of Birth: 1950-02-15 Attending MD: Gennette Pac , MD, [id number] CSN: [id number] Age: 73 Admit Type: Outpatient Procedure:                Colonoscopy Indications:              Screening for colorectal malignant neoplasm Providers:                Gennette Pac, MD, Buel Ream. Thomasena Edis RN, RN,                            Zena Amos Referring MD:              Medicines:                Propofol per Anesthesia Complications:            No immediate complications. Estimated Blood Loss:     Estimated blood loss: none. Procedure:                Pre-Anesthesia Assessment:                           - Prior to the procedure, a History and Physical                            was performed, and patient medications and                            allergies were reviewed. The patient's tolerance of                            previous anesthesia was also reviewed. The risks                            and benefits of the procedure and the sedation                            options and risks were discussed with the patient.                            All questions were answered, and informed consent                            was obtained. Prior Anticoagulants: The patient has                            taken no anticoagulant or antiplatelet agents. ASA                            Grade Assessment: II - A patient with mild systemic                            disease. After reviewing the risks and benefits,  the patient was deemed in satisfactory condition to                            undergo the procedure.                           After obtaining informed consent, the colonoscope                            was passed under direct vision. Throughout the                            procedure, the patient's blood pressure, pulse, and                             oxygen saturations were monitored continuously. The                            [id number]) scope was introduced through the                            anus and advanced to the the cecum, identified by                            appendiceal orifice and ileocecal valve. The                            colonoscopy was performed without difficulty. The                            patient tolerated the procedure well. The quality                            of the bowel preparation was adequate. The                            ileocecal valve, appendiceal orifice, and rectum                            were photographed. The colonoscopy was performed                            without difficulty. The patient tolerated the                            procedure well. The quality of the bowel                            preparation was adequate. Scope In: 7:36:20 AM Scope Out: 7:52:55 AM Scope Withdrawal Time: 0 hours 8 minutes 29 seconds  Total Procedure Duration: 0 hours 16 minutes 35 seconds  Findings:      The perianal and digital rectal examinations were normal.      The colon (entire examined portion) appeared normal.      The retroflexed view  of the distal rectum and anal verge was normal and       showed no anal or rectal abnormalities. Estimated blood loss: none. Impression:               - The entire examined colon is normal.                           - The distal rectum and anal verge are normal on                            retroflexion view.                           - No specimens collected. Moderate Sedation:      Moderate (conscious) sedation was personally administered by an       anesthesia professional. The following parameters were monitored: oxygen       saturation, heart rate, blood pressure, respiratory rate, EKG, adequacy       of pulmonary ventilation, and response to care. Recommendation:           - Patient has a contact number available for                             emergencies. The signs and symptoms of potential                            delayed complications were discussed with the                            patient. Return to normal activities tomorrow.                            Written discharge instructions were provided to the                            patient.                           - Advance diet as tolerated.                           - No repeat colonoscopy due to age.                           - Return to GI clinic PRN. Procedure Code(s):        --- Professional ---                           [id number], Colonoscopy, flexible; diagnostic, including                            collection of specimen(s) by brushing or washing,                            when performed (separate procedure) Diagnosis Code(s):        --- Professional ---  Z12.11, Encounter for screening for malignant                            neoplasm of colon CPT copyright 2022 American Medical Association. All rights reserved. The codes documented in this report are preliminary and upon coder review may  be revised to meet current compliance requirements. Gerrit Friends. Lineth Thielke, MD Gennette Pac, MD 01/18/2023 8:47:55 AM This report has been signed electronically. Number of Addenda: 0

## 2023-01-18 NOTE — Transfer of Care (Signed)
Immediate Anesthesia Transfer of Care Note  Patient: Eileen Green  Procedure(s) Performed: COLONOSCOPY WITH PROPOFOL  Patient Location: PACU and Endoscopy Unit  Anesthesia Type:General  Level of Consciousness: awake  Airway & Oxygen Therapy: Patient Spontanous Breathing  Post-op Assessment: Report given to RN and Post -op Vital signs reviewed and stable  Post vital signs: Reviewed and stable  Last Vitals:  Vitals Value Taken Time  BP    Temp    Pulse    Resp    SpO2      Last Pain:  Vitals:   01/18/23 0734  TempSrc:   PainSc: 0-No pain      Patients Stated Pain Goal: 7 (01/18/23 0656)  Complications: No notable events documented.

## 2023-01-18 NOTE — Anesthesia Postprocedure Evaluation (Signed)
Anesthesia Post Note  Patient: Eileen Green  Procedure(s) Performed: COLONOSCOPY WITH PROPOFOL  Patient location during evaluation: PACU Anesthesia Type: General Level of consciousness: awake and alert Pain management: pain level controlled Vital Signs Assessment: post-procedure vital signs reviewed and stable Respiratory status: spontaneous breathing, nonlabored ventilation, respiratory function stable and patient connected to nasal cannula oxygen Cardiovascular status: blood pressure returned to baseline and stable Postop Assessment: no apparent nausea or vomiting Anesthetic complications: no   There were no known notable events for this encounter.   Last Vitals:  Vitals:   01/18/23 0656 01/18/23 0756  BP: (!) 147/77 (!) 106/53  Pulse: 73 78  Resp: 17 10  Temp: 36.5 C 36.5 C  SpO2: 95% 97%    Last Pain:  Vitals:   01/18/23 0756  TempSrc: Axillary  PainSc: 0-No pain                 Ilayda Toda L Kadrian Partch

## 2023-01-19 ENCOUNTER — Encounter (HOSPITAL_COMMUNITY): Payer: Self-pay | Admitting: Internal Medicine

## 2023-02-17 ENCOUNTER — Other Ambulatory Visit: Payer: Self-pay | Admitting: Internal Medicine

## 2023-04-08 ENCOUNTER — Other Ambulatory Visit: Payer: Self-pay | Admitting: Internal Medicine

## 2023-04-11 NOTE — Telephone Encounter (Signed)
Pt called to check on the status of the refill request.

## 2023-04-27 LAB — LAB REPORT - SCANNED
EGFR: 79
TSH: 1.32 (ref 0.41–5.90)

## 2023-05-17 ENCOUNTER — Ambulatory Visit: Payer: Federal, State, Local not specified - PPO | Attending: Internal Medicine | Admitting: Internal Medicine

## 2023-05-17 ENCOUNTER — Encounter: Payer: Self-pay | Admitting: Internal Medicine

## 2023-05-17 VITALS — BP 130/78 | HR 80 | Ht 65.0 in | Wt 177.6 lb

## 2023-05-17 DIAGNOSIS — I700 Atherosclerosis of aorta: Secondary | ICD-10-CM

## 2023-05-17 DIAGNOSIS — I10 Essential (primary) hypertension: Secondary | ICD-10-CM

## 2023-05-17 DIAGNOSIS — R011 Cardiac murmur, unspecified: Secondary | ICD-10-CM | POA: Diagnosis not present

## 2023-05-17 DIAGNOSIS — E785 Hyperlipidemia, unspecified: Secondary | ICD-10-CM

## 2023-05-17 NOTE — Patient Instructions (Signed)
 Medication Instructions:  NO CHANGES  *If you need a refill on your cardiac medications before your next appointment, please call your pharmacy*   Follow-Up: At [facility], you and your health needs are our priority.  As part of our continuing mission to provide you with exceptional heart care, our providers are all part of one team.  This team includes your primary Cardiologist (physician) and Advanced Practice Providers or APPs (Physician Assistants and Nurse Practitioners) who all work together to provide you with the care you need, when you need it.  Your next appointment:    12 months with Dr. Maximo Spar  We recommend signing up for the patient portal called "MyChart".  Sign up information is provided on this After Visit Summary.  MyChart is used to connect with patients for Virtual Visits (Telemedicine).  Patients are able to view lab/test results, encounter notes, upcoming appointments, etc.  Non-urgent messages can be sent to your provider as well.   To learn more about what you can do with MyChart, go to [email or web address].

## 2023-05-17 NOTE — Progress Notes (Signed)
 OFFICE NOTE  Chief Complaint:  Follow-up  Primary Care Physician: Artemisa Bile, MD  HPI:  Eileen Green is a 73 y.o. female patient referred by Dr. Hildy Lowers for evaluation of chest pain. She reports she has an episode of chest pain about every month. It might be related to stress. 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 She does have a family history of heart disease with her mother who had an MI in her 76s and her father who had chronic kidney disease and was on dialysis. She denies any worsening shortness of breath or increased fatigue or decreased exercise tolerance. She does have additional risk factors including hypertension and dyslipidemia which are well controlled. Her recent LDL cholesterol was 108 however she is on a low-dose of a low potency statin. She does not have a history of any side effects on that.  02/08/2016  Eileen Green underwent a stress echocardiogram which was negative for ischemia. She had good exercise tolerance despite having had an upper respiratory infection about a week prior to that. She says she was able to tolerate exercise without any significant shortness of breath which is a good sign. Oxygen saturation today however was only 95% and she says she's recently had some cough. Blood pressure is at goal. I felt like her chest pain is noncardiac and seem somewhat atypical. She does have cardio Larinda Plover factors and a family history of premature coronary disease and I agree with aggressive primary prevention.   10/03/2017  Eileen Green returns today for follow-up.  She was seen this past summer by Friddie Jetty, DNP, and has been asymptomatic.  She did report that she was having some cramping in her calves.  Was thought this might be due to her pravastatin .  The dose was decreased from 40 to 20 mg and she was placed on ezetimibe .  Repeat lipid profile testing was performed 11 days ago which demonstrated a nice improvement in her lipid  profile total cholesterol 148, triglycerides 92, HDL 46 and LDL 84.  She reports she still has some possible statin side effects.  10/03/2018  Eileen Green returns today for follow-up.  Overall she says she is feeling fairly well.  She is having some issues with possible anxiety.  She is feeling jittery when she does some of her classes to [employer].  She had possible side effects and pravastatin  and ultimately was changed over to rosuvastatin  5 mg once weekly.  She also takes ezetimibe .  Repeat labs were performed recently which showed total cholesterol 166, triglycerides 70, HDL 56 and LDL of 97.  This is her target LDL less than 100.  Blood pressures well controlled today 130/84.  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  10/14/2019  Eileen Green returns today for follow-up.  She underwent right total hip replacement by Dr. Lucienne Ryder and seems to be doing well with regards to that.  She denies any chest pain or shortness of breath.  EKG shows sinus rhythm with sinus arrhythmia.  Labs in August showed total cholesterol 182, HDL 50 and triglycerides 104.  10/14/2020  Eileen Green returns today for follow-up. She recently had to have gallbladder surgery. She continues on protonix  40 mg BID and famotidine , but has been having some chest discomfort and tightness in her throat. She was at a football game last weekend with her son in [place] and had an episode which was much  like severe reflux - she had also had some leg weakness and shortness of breath with exertion at the stadium.  She had prior stress echo testing in 2018, but nothing recent. Her mother had CABG at a younger age. Lipids last year were not at goal - LDL 113.   11/14/2021  Eileen Green is seen today in follow-up.  She has some vague complaints including leg cramping, pain behind the right ear and some occasional throat pain.  We did have a CT coronary angiogram performed a year or 2 ago which showed  essentially no coronary disease.  She is concerned about her leg pain as to whether this might be PAD.  She denies any true symptoms of claudication.  She gets some occasional cramping.  She also gets some pain in her jaw.  She was not sure if this might be related to statins.  She only takes Crestor  5 mg once a week.  She also is on Zetia .  Her LDL is actually been quite low with total cholesterol 154, triglycerides 92, HDL 48 and LDL 89.  05/17/2023  Eileen Green is seen today in follow-up.  She seems to be doing well.  She denies any chest pain or worsening shortness of breath.  She is on combination of atorvastatin  20 mg daily and ezetimibe .  Her lipids are very good at this point with recent testing showing total cholesterol of 115, triglycerides 87, HDL 46 and LDL 52.  Of note however her liver enzymes are little elevated with AST 65 and ALT 79.  Will need to continue to follow those.  This could suggest underlying fatty liver or be of biliary origin.  She has had a prior cholecystectomy.  CT of the abdomen in October 2023 however did not show any fatty liver or any significant hepatobiliary findings..  She did have recent blood work which showed a high calcium  at 10.9.  Her PCP will need to follow this and make sure that she does not have any hyperparathyroidism.  PMHx:  Past Medical History:  Diagnosis Date   Arthritis    Cancer (HCC)    skin melanoma under left eye   Deaf    LEFT EAR   GERD (gastroesophageal reflux disease)    Hyperlipidemia Deaf in Left ear   Hypertension    Hypothyroidism     Past Surgical History:  Procedure Laterality Date   ABDOMINAL HYSTERECTOMY  2001   Partial   CHOLECYSTECTOMY N/A 04/21/2020   Procedure: LAPAROSCOPIC CHOLECYSTECTOMY;  Surgeon: Alanda Allegra, MD;  Location: [facility];  Service: General;  Laterality: N/A;   COLONOSCOPY  01/21/2003   AOZ:HYQMVH rectum/ Long capacious colon, moderate prep made the exam more challenging.   COLONOSCOPY  05/18/2009    Dr. Riley Cheadle: Normal rectum/narrow mouth left sided diverticula    COLONOSCOPY N/A 05/14/2013   Dr. Riley Cheadle: hemorrhoids, colonic diverticula, redudant colon   COLONOSCOPY WITH PROPOFOL  N/A 01/18/2023   Procedure: COLONOSCOPY WITH PROPOFOL ;  Surgeon: Suzette Espy, MD;  Location: [facility];  Service: Endoscopy;  Laterality: N/A;  7:30 am, asa 2   ESOPHAGOGASTRODUODENOSCOPY  01/21/2003   RMR:  Normal esophagus, stomach, D1 and D2   ESOPHAGOGASTRODUODENOSCOPY N/A 03/24/2014   Dr. Riley Cheadle :Hiatal hernia; otherwise negative EGD   ESOPHAGOGASTRODUODENOSCOPY N/A 06/27/2019   Procedure: ESOPHAGOGASTRODUODENOSCOPY (EGD);  Surgeon: Suzette Espy, MD;  Location: [facility];  Service: Endoscopy;  Laterality: N/A;  7:30am   TONSILLECTOMY     TOTAL HIP ARTHROPLASTY Right 05/02/2019  Procedure: RIGHT TOTAL HIP ARTHROPLASTY ANTERIOR APPROACH;  Surgeon: Arnie Lao, MD;  Location: [facility];  Service: Orthopedics;  Laterality: Right;    FAMHx:  Family History  Problem Relation Age of Onset   Heart disease Mother 77   Dementia Mother    Kidney disease Father    Thyroid  nodules Father    Colon cancer Neg Hx     SOCHx:   reports that she has never smoked. She has never used smokeless tobacco. She reports that she does not drink alcohol and does not use drugs.  ALLERGIES:  Allergies  Allergen Reactions   Crestor  [Rosuvastatin ] Other (See Comments)    Myalgias, leg pain    ROS: Pertinent items noted in HPI and remainder of comprehensive ROS otherwise negative.  HOME MEDS: Current Outpatient Medications on File Prior to Visit  Medication Sig Dispense Refill   amLODipine (NORVASC) 5 MG tablet Take 5 mg by mouth daily.     atorvastatin  (LIPITOR) 20 MG tablet TAKE 1 TABLET(20 MG) BY MOUTH DAILY 90 tablet 0   Carboxymethylcellulose Sod PF (THERATEARS PF) 0.25 % SOLN 1 dropperette as needed by ophthalmic route.     Cholecalciferol  (VITAMIN D3) 50 MCG (2000 UT) capsule Take 2,000 Units by  mouth daily.     dexlansoprazole  (DEXILANT ) 60 MG capsule TAKE 1 CAPSULE(60 MG) BY MOUTH DAILY 30 capsule 3   estradiol  (ESTRACE ) 0.1 MG/GM vaginal cream Discard plastic applicator. Insert a blueberry size amount (approximately 1 gram) of cream on fingertip inside vagina at bedtime every night for 1 week then every other night for long term use. 30 g 3   ezetimibe  (ZETIA ) 10 MG tablet TAKE 1 TABLET(10 MG) BY MOUTH DAILY 90 tablet 3   FERROUS SULFATE PO Take 65 mg by mouth daily with breakfast.     Glucosamine-Chondroitin (OSTEO BI-FLEX REGULAR STRENGTH PO) Take 1 tablet by mouth daily.      levothyroxine  (SYNTHROID ) 50 MCG tablet Take 50 mcg by mouth daily.     Methylcellulose, Laxative, 500 MG TABS Take 1,000 mg by mouth daily.      Multiple Vitamins-Minerals (ONE DAILY WOMENS 50 PLUS PO) Take 0.5 tablets by mouth See admin instructions. Mon, Wed Fri and Sat  Centrum Silver 50+     polyethylene glycol (MIRALAX  / GLYCOLAX ) packet Take 17 g by mouth daily.     valsartan (DIOVAN) 320 MG tablet Take 320 mg by mouth at bedtime.      No current facility-administered medications on file prior to visit.    LABS/IMAGING: No results found for this or any previous visit (from the past 48 hours). No results found.  WEIGHTS: Wt Readings from Last 3 Encounters:  05/17/23 177 lb 9.6 oz (80.6 kg)  01/18/23 179 lb (81.2 kg)  12/05/22 184 lb 6.4 oz (83.6 kg)    VITALS: BP 130/78 (BP Location: Left Arm, Patient Position: Sitting)   Pulse 80   Ht 5\' 5"  (1.651 m)   Wt 177 lb 9.6 oz (80.6 kg)   SpO2 96%   BMI 29.55 kg/m   EXAM: General appearance: alert and no distress Neck: no carotid bruit, no JVD and thyroid  not enlarged, symmetric, no tenderness/mass/nodules Lungs: clear to auscultation bilaterally Heart: regular rate and rhythm Abdomen: soft, non-tender; bowel sounds normal; no masses,  no organomegaly Extremities: extremities normal, atraumatic, no cyanosis or edema Pulses: 2+ and  symmetric Skin: Skin color, texture, turgor normal. No rashes or lesions Neurologic: Grossly normal Psych: Pleasant  EKG: EKG Interpretation  Date/Time:  Thursday May 17 2023 09:45:25 EDT Ventricular Rate:  80 PR Interval:  150 QRS Duration:  80 QT Interval:  356 QTC Calculation: 410 R Axis:   42  Text Interpretation: Normal sinus rhythm Normal ECG When compared with ECG of 17-Nov-2022 10:36, No significant change was found Confirmed by Dinah Franco [phone]) on 05/17/2023 9:50:48 AM    ASSESSMENT: Dyspnea, chest pressure, throat tightness -essentially normal CT coronary angiogram on October 22, 2020, 0 CAC score Intermittent chest pain - negative stress echo (2018) Hypertension-controlled Dyslipidemia, at goal LDL less than 70 Aortic atherosclerosis Family history of coronary disease Murmur Hypothyroidism  PLAN: 1.   Eileen Green seems to be doing well with good control of her cholesterol.  Although her liver enzymes are mildly elevated and alkaline phosphatase is as well.  I would keep a close eye on this on her current therapy.  If her numbers worsen we may have to back off somewhat.  Otherwise she seems to be doing well.  Blood pressure is well-controlled today.  She has no cardiac symptoms.  Will continue her current therapies.  Plan follow-up annually or sooner as necessary.  Hazle Lites, MD, [facility], FNLA, FACP  [place]  [facility]  Medical Director of the Advanced Lipid Disorders &  Cardiovascular Risk Reduction Clinic Diplomate of the American Board of Clinical Lipidology Attending Cardiologist  Direct Dial: [phone]  Fax: [phone]  Website:  [email or web address] Eartha Vonbehren 05/17/2023, 9:50 AM

## 2023-05-22 ENCOUNTER — Other Ambulatory Visit: Payer: Self-pay | Admitting: Internal Medicine

## 2023-08-09 ENCOUNTER — Telehealth: Payer: Self-pay | Admitting: *Deleted

## 2023-08-09 NOTE — Telephone Encounter (Signed)
 Voice mail --she left a message about issue with [employer] and her Dexilant  RX.  She got an email that stated the PA had not been completed but it has per [employer] she needs new RX for it and she just realized she gets generic which she is confused on.  Can we call this in for her.

## 2023-08-10 ENCOUNTER — Other Ambulatory Visit: Payer: Self-pay

## 2023-08-10 MED ORDER — DEXLANSOPRAZOLE 60 MG PO CPDR: 60.0000 mg | DELAYED_RELEASE_CAPSULE | Freq: Every day | ORAL | 11 refills | Status: AC

## 2023-08-10 NOTE — Telephone Encounter (Signed)
 PA was approved on 07/10/2023, another PA was not needed. Refills of dexlansoprazole  was sent to pharmacy on file.

## 2023-09-09 ENCOUNTER — Ambulatory Visit
Admission: EM | Admit: 2023-09-09 | Discharge: 2023-09-09 | Disposition: A | Discharge status: Home / Self Care | Attending: Nurse Practitioner | Admitting: Nurse Practitioner

## 2023-09-09 DIAGNOSIS — J069 Acute upper respiratory infection, unspecified: Secondary | ICD-10-CM

## 2023-09-09 DIAGNOSIS — R051 Acute cough: Secondary | ICD-10-CM

## 2023-09-09 LAB — POC SOFIA SARS ANTIGEN FIA: SARS Coronavirus 2 Ag: NEGATIVE

## 2023-09-09 MED ORDER — PROMETHAZINE-DM 6.25-15 MG/5ML PO SYRP: 5.0000 mL | ORAL_SOLUTION | Freq: Four times a day (QID) | ORAL | 0 refills | Status: AC | PRN

## 2023-09-09 MED ORDER — MUCINEX DM MAXIMUM STRENGTH 60-1200 MG PO TB12: 1.0000 | ORAL_TABLET | Freq: Two times a day (BID) | ORAL | 0 refills | Status: AC

## 2023-09-09 MED ORDER — PREDNISONE 20 MG PO TABS: 40.0000 mg | ORAL_TABLET | Freq: Every day | ORAL | 0 refills | Status: AC

## 2023-09-09 MED ORDER — IPRATROPIUM-ALBUTEROL 0.5-2.5 (3) MG/3ML IN SOLN
3.0000 mL | Freq: Once | RESPIRATORY_TRACT | Status: AC
  Administered 2023-09-09: 3 mL via RESPIRATORY_TRACT

## 2023-09-09 NOTE — ED Triage Notes (Addendum)
 Pt reports congestion, but is unable to blow out mucus or cough up mucus. X 1 week has been using guaifenesin  but has found no relief made pt's chest feel almost raw on the inside now has pain on right side of chest that has been present for 24 hours.

## 2023-09-09 NOTE — Discharge Instructions (Addendum)

## 2023-09-09 NOTE — ED Provider Notes (Signed)
 [facility]    CSN: [id number] Arrival date & time: 09/09/23  0913      History   Chief Complaint No chief complaint on file.   HPI Eileen Green is a 73 y.o. female.   Discussed the use of AI scribe software for clinical note transcription with the patient, who gave verbal consent to proceed.   Patient presents with complaints of chest discomfort, cough, and sinus congestion that began on 4 days ago after returning from the beach with her husband. The patient reports that her chest felt raw. with some improvement 2 days later, followed by worsening symptoms yesterday. The patient describes a little spot of pain in her right chest that hurts when she takes a deep breath or coughs. She experiences coughing fits, particularly at the top of her throat, which were worse at bedtime last night. The cough has been mostly dry, with minimal mucus production. This morning, she was able to cough up a small amount of light-colored sputum. She denies wheezing or shortness of breath but reports some sneezing and occasional headaches. The patient also mentions sinus congestion, which improves temporarily after a hot shower.  She denies fever, chills, body aches, nausea, vomiting, diarrhea, or sore throat. She has not been around anyone known to be sick. The patient has been self-medicating with Mucinex  DM and Nyquil at night. The patient reports feeling better this morning compared to last night, although she felt particularly unwell yesterday. She performed a rapid COVID-19 test yesterday, which was negative. She doesn't smoke and denies any history of lung issues.   The following portions of the patient's history were reviewed and updated as appropriate: allergies, current medications, past family history, past medical history, past social history, past surgical history, and problem list.    Past Medical History:  Diagnosis Date   Arthritis    Cancer (HCC)    skin melanoma under left  eye   Deaf    LEFT EAR   GERD (gastroesophageal reflux disease)    Hyperlipidemia Deaf in Left ear   Hypertension    Hypothyroidism     Patient Active Problem List   Diagnosis Date Noted   Female genital prolapse 10/06/2022   Plantar fasciitis 02/05/2022   Hyperlipidemia LDL goal <100 03/02/2021   Calculus of gallbladder without cholecystitis without obstruction    Status post total replacement of right hip 05/02/2019   Family history of coronary artery disease 03/31/2019   Dysphagia 03/05/2019   Unilateral primary osteoarthritis, right hip 08/14/2018   Epigastric discomfort 12/29/2016   Dyslipidemia 01/13/2016   Murmur 01/13/2016   Essential hypertension 01/13/2016   Hiatal hernia    Dyspepsia 03/18/2014   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   COLONOSCOPY  05/18/2009   Dr. Shaaron: Normal rectum/narrow mouth left sided diverticula    COLONOSCOPY N/A 05/14/2013   Dr. Shaaron: hemorrhoids, colonic diverticula, redudant colon   COLONOSCOPY WITH PROPOFOL  N/A 01/18/2023   Procedure: COLONOSCOPY WITH PROPOFOL ;  Surgeon: Shaaron Lamar HERO, MD;  Location: [facility];  Service: Endoscopy;  Laterality: N/A;  7:30 am, asa 2   ESOPHAGOGASTRODUODENOSCOPY  01/21/2003   RMR:  Normal esophagus, stomach, D1 and D2   ESOPHAGOGASTRODUODENOSCOPY N/A 03/24/2014   Dr. Shaaron :Hiatal hernia; otherwise negative EGD   ESOPHAGOGASTRODUODENOSCOPY N/A 06/27/2019   Procedure: ESOPHAGOGASTRODUODENOSCOPY (EGD);  Surgeon: Shaaron Lamar HERO, MD;  Location: [facility];  Service: Endoscopy;  Laterality: N/A;  7:30am   TONSILLECTOMY     TOTAL HIP ARTHROPLASTY Right 05/02/2019   Procedure: RIGHT TOTAL HIP ARTHROPLASTY ANTERIOR APPROACH;  Surgeon: Vernetta Lonni GRADE, MD;  Location: [facility];  Service: Orthopedics;  Laterality: Right;    OB History   No obstetric history on file.      Home Medications    Prior to Admission medications   Medication Sig Start Date End Date Taking? Authorizing Provider  Dextromethorphan-guaiFENesin  (MUCINEX  DM MAXIMUM STRENGTH) 60-1200 MG TB12 Take 1 tablet by mouth 2 (two) times daily. 09/09/23  Yes Jaran Sainz, FNP  predniSONE  (DELTASONE ) 20 MG tablet Take 2 tablets (40 mg total) by mouth daily for 5 days. 09/09/23 09/14/23 Yes Dequante Tremaine, FNP  promethazine -dextromethorphan (PROMETHAZINE -DM) 6.25-15 MG/5ML syrup Take 5 mLs by mouth every 6 (six) hours as needed for cough. 09/09/23  Yes Aris Even, FNP  amLODipine (NORVASC) 5 MG tablet Take 5 mg by mouth daily.    [provider]  atorvastatin  (LIPITOR) 20 MG tablet TAKE 1 TABLET(20 MG) BY MOUTH DAILY 05/22/23   Hilty, Vinie BROCKS, MD  Carboxymethylcellulose Sod PF (THERATEARS PF) 0.25 % SOLN 1 dropperette as needed by ophthalmic route.    [provider]  Cholecalciferol  (VITAMIN D3) 50 MCG (2000 UT) capsule Take 2,000 Units by mouth daily.    [provider]  dexlansoprazole  (DEXILANT ) 60 MG capsule Take 1 capsule (60 mg total) by mouth daily. 08/10/23   Shaaron Lamar HERO, MD  estradiol  (ESTRACE ) 0.1 MG/GM vaginal cream Discard plastic applicator. Insert a blueberry size amount (approximately 1 gram) of cream on fingertip inside vagina at bedtime every night for 1 week then every other night for long term use. 10/12/22   Gerldine Lauraine BROCKS, FNP  ezetimibe  (ZETIA ) 10 MG tablet TAKE 1 TABLET(10 MG) BY MOUTH DAILY 01/01/23   Hilty, Vinie BROCKS, MD  FERROUS SULFATE PO Take 65 mg by mouth daily with breakfast.     [provider]  Glucosamine-Chondroitin (OSTEO BI-FLEX REGULAR STRENGTH PO) Take 1 tablet by mouth daily.     [provider]  levothyroxine  (SYNTHROID ) 50 MCG tablet Take 50 mcg by mouth daily.    [provider]  Methylcellulose, Laxative, 500 MG TABS Take 1,000 mg by mouth daily.     [provider]  Multiple Vitamins-Minerals (ONE DAILY WOMENS 50 PLUS PO) Take 0.5 tablets by mouth See admin instructions. Mon, Wed Fri and Sat  Centrum Silver 50+    [provider]  polyethylene glycol (MIRALAX  / GLYCOLAX ) packet Take 17 g by mouth daily.    [provider]  valsartan (DIOVAN) 320 MG tablet Take 320 mg by mouth at bedtime.  09/08/18   [provider]    Family History Family History  Problem Relation Age of Onset   Heart disease Mother 58   Dementia Mother    Kidney disease Father    Thyroid  nodules Father    Colon cancer Neg Hx     Social History Social History   Tobacco Use   Smoking status: Never   Smokeless tobacco: Never   Tobacco comments:  Never smoked  Vaping Use   Vaping status: Never Used  Substance Use Topics   Alcohol use: No   Drug use: No     Allergies   Crestor  [rosuvastatin ]   Review of Systems Review of Systems  Constitutional:  Negative for chills and fever.  HENT:  Positive for congestion, postnasal drip, rhinorrhea, sinus pressure and sneezing. Negative for sore throat.   Respiratory:  Positive for cough. Negative for shortness of breath and wheezing.   Gastrointestinal:  Negative for diarrhea, nausea and vomiting.  Musculoskeletal:  Positive for myalgias.  Neurological:  Positive for headaches.  All other systems reviewed and are negative.    Physical Exam Triage Vital Signs ED Triage Vitals  Encounter Vitals Group     BP 09/09/23 0925 (!) 153/89     Girls Systolic BP Percentile --      Girls Diastolic BP Percentile --      Boys Systolic BP Percentile --      Boys  Diastolic BP Percentile --      Pulse Rate 09/09/23 0925 89     Resp 09/09/23 0925 20     Temp 09/09/23 0925 98.7 F (37.1 C)     Temp Source 09/09/23 0925 Oral     SpO2 09/09/23 0925 95 %     Weight --      Height --      Head Circumference --      Peak Flow --      Pain Score 09/09/23 0927 5     Pain Loc --      Pain Education --      Exclude from Growth Chart --    No data found.  Updated Vital Signs BP (!) 153/89 (BP Location: Right Arm)   Pulse 89   Temp 98.7 F (37.1 C) (Oral)   Resp 20   SpO2 95%   Visual Acuity Right Eye Distance:   Left Eye Distance:   Bilateral Distance:    Right Eye Near:   Left Eye Near:    Bilateral Near:     Physical Exam Vitals reviewed.  Constitutional:      General: She is awake. She is not in acute distress.    Appearance: Normal appearance. She is well-developed. She is not ill-appearing, toxic-appearing or diaphoretic.  HENT:     Head: Normocephalic.     Right Ear: Tympanic membrane, ear canal and external ear normal. No drainage, swelling or tenderness. No middle ear effusion. Tympanic membrane is not erythematous.     Left Ear: Tympanic membrane, ear canal and external ear normal. No drainage, swelling or tenderness.  No middle ear effusion. Tympanic membrane is not erythematous.     Nose: Congestion present. No rhinorrhea.     Mouth/Throat:     Lips: Pink.     Mouth: Mucous membranes are moist.     Pharynx: No pharyngeal swelling, oropharyngeal exudate, posterior oropharyngeal erythema or uvula swelling.     Tonsils: No tonsillar exudate or tonsillar abscesses.  Eyes:     General: Vision grossly intact.     Conjunctiva/sclera: Conjunctivae normal.  Cardiovascular:     Rate and Rhythm: Normal rate and regular rhythm.     Heart sounds: Normal heart sounds.  Pulmonary:     Effort: Pulmonary effort is normal. No tachypnea or respiratory distress.     Breath sounds: Normal breath sounds and air entry.     Comments:  Respirations even and unlabored  Musculoskeletal:  General: Normal range of motion.     Cervical back: Full passive range of motion without pain, normal range of motion and neck supple.     Right lower leg: No edema.     Left lower leg: No edema.  Lymphadenopathy:     Cervical: Cervical adenopathy present.  Skin:    General: Skin is warm and dry.  Neurological:     General: No focal deficit present.     Mental Status: She is alert and oriented to person, place, and time.     Sensory: Sensation is intact.     Motor: Motor function is intact.     Gait: Gait is intact.  Psychiatric:        Behavior: Behavior is cooperative.      UC Treatments / Results  Labs (all labs ordered are listed, but only abnormal results are displayed) Labs Reviewed  POC SOFIA SARS ANTIGEN FIA     EKG   Radiology No results found.  Procedures Procedures (including critical care time)  Medications Ordered in UC Medications  ipratropium-albuterol  (DUONEB) 0.5-2.5 (3) MG/3ML nebulizer solution 3 mL (3 mLs Nebulization Given 09/09/23 1010)    Initial Impression / Assessment and Plan / UC Course  I have reviewed the triage vital signs and the nursing notes.  Pertinent labs & imaging results that were available during my care of the patient were reviewed by me and considered in my medical decision making (see chart for details).    The patient presents with symptoms consistent with a viral upper respiratory infection.  COVID test is negative.  Discomfort improved after nebulizer treatment in clinic.  Exam is reassuring and no evidence of bacterial infection or acute cardiopulmonary process is noted. Supportive care is recommended. Patient was advised to follow up with primary care if symptoms do not improve within one week or if new concerns arise. Instructions were given to seek emergency care if symptoms worsen, including shortness of breath, chest pain, persistent high fever, inability to  tolerate fluids, or confusion.  Today's evaluation has revealed no signs of a dangerous process. Discussed diagnosis with patient and/or guardian. Patient and/or guardian aware of their diagnosis, possible red flag symptoms to watch out for and need for close follow up. Patient and/or guardian understands verbal and written discharge instructions. Patient and/or guardian comfortable with plan and disposition.  Patient and/or guardian has a clear mental status at this time, good insight into illness (after discussion and teaching) and has clear judgment to make decisions regarding their care  Documentation was completed with the aid of voice recognition software. Transcription may contain typographical errors. Final Clinical Impressions(s) / UC Diagnoses   Final diagnoses:  Acute cough  Viral upper respiratory tract infection     Discharge Instructions      Your symptoms are most likely caused by a respiratory infection, which affects areas like your nose, throat, or lungs. This type of infection is usually caused by a virus. Since your illness is caused by a virus, antibiotics won't help because they only treat infections caused by bacteria.  Take the medications that were prescribed to you as directed. If you have a fever, headache, or body aches, you can also take Tylenol  or ibuprofen to help you feel more comfortable. Be sure to drink plenty of fluids to stay hydrated--aim for enough to keep your urine a pale yellow color. This will also help to thin mucus and make it easier to clear from your body.   Using  a cool mist humidifier at home to keep humidity levels above 50% can be helpful. You can also inhale steam for 10 to 15 minutes, 3 to 4 times a day. This can be done by sitting in the bathroom with a hot shower running, or by using over-the-counter vapor shower tablets to help with nasal congestion. Try to avoid cool or dry air as much as possible. When you sleep, keep your head elevated to  help reduce post-nasal drainage. Be sure to get enough rest every night to support your recovery.Don't forget to replace your toothbrush once you start feeling better.   It's normal for a cough to linger for several weeks after a respiratory illness, even after other symptoms have resolved. This happens because the airways remain irritated and take time to fully heal. As long as the cough gradually improves and there are no new concerning symptoms, this is part of the normal recovery process.  If your symptoms get worse or if you develop any new or concerning symptoms, go to the emergency room right away. If you're not feeling better in a few days, follow up with your primary care provider.            ED Prescriptions     Medication Sig Dispense Auth. Provider   Dextromethorphan-guaiFENesin  (MUCINEX  DM MAXIMUM STRENGTH) 60-1200 MG TB12 Take 1 tablet by mouth 2 (two) times daily. 20 tablet Iola Lukes, FNP   predniSONE  (DELTASONE ) 20 MG tablet Take 2 tablets (40 mg total) by mouth daily for 5 days. 10 tablet Iola Lukes, FNP   promethazine -dextromethorphan (PROMETHAZINE -DM) 6.25-15 MG/5ML syrup Take 5 mLs by mouth every 6 (six) hours as needed for cough. 118 mL Iola Lukes, FNP      PDMP not reviewed this encounter.   Iola Lukes, [place] 09/09/23 1054

## 2023-11-23 ENCOUNTER — Ambulatory Visit

## 2023-11-23 ENCOUNTER — Other Ambulatory Visit: Payer: Self-pay

## 2023-11-23 ENCOUNTER — Ambulatory Visit
Admission: RE | Admit: 2023-11-23 | Discharge: 2023-11-23 | Disposition: A | Discharge status: Home / Self Care | Source: Ambulatory Visit | Attending: Nurse Practitioner | Admitting: Nurse Practitioner

## 2023-11-23 VITALS — BP 113/66 | HR 79 | Temp 98.2°F | Resp 20

## 2023-11-23 DIAGNOSIS — R079 Chest pain, unspecified: Secondary | ICD-10-CM

## 2023-11-23 DIAGNOSIS — J069 Acute upper respiratory infection, unspecified: Secondary | ICD-10-CM | POA: Diagnosis not present

## 2023-11-23 MED ORDER — BENZONATATE 100 MG PO CAPS: 100.0000 mg | ORAL_CAPSULE | Freq: Three times a day (TID) | ORAL | 0 refills | Status: AC | PRN

## 2023-11-23 NOTE — ED Triage Notes (Addendum)
 Pt reports abdominal fullness after eating, general malaise,cough for last two weeks. Has taken otc medication with minimal relief of symptoms.

## 2023-11-23 NOTE — ED Provider Notes (Signed)
 [facility]    CSN: [id number] Arrival date & time: 11/23/23  1448      History   Chief Complaint Chief Complaint  Patient presents with   URI    mild cold symptoms; chest discomfort; reflux at times - Entered by patient    HPI Eileen Green is a 73 y.o. female.   Patient presents today with 1 week history of congested cough, feeling like she has to take frequent deep breaths, chest pain in her sternum, under her bilateral rib cage under her breasts, and feeling like food will go down, and fatigue.  She reports the chest pain is most concerning to her today.  She reports the pain is currently 6 out of 10 and is the middle of her chest.  Reports the pain feels like an aching and is constant.  Nothing makes the pain better or worse except when she lays down the pain does give a little bit better.  No fever, chills, wheezing, runny or stuffy nose, sore throat, sinus pressure, abdominal pain, nausea/vomiting, diarrhea, or change in appetite.  No known sick contacts.  Reports symptoms began, she took an antihistamine which helped clear up the runny nose.  She took a decongestant the next day which made her feel better as well.  She has been taking NyQuil for 3 nights in a row that helps make her rest.  Medical history significant for reflux-takes Dexilant  daily.  Does not really try to avoid food triggers.  Reports history of hiatal hernia.  She is due for follow-up with gastroenterologist next month.  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  She admits she has a lot on her plate.  A couple of people in her life have recently passed away from cancer.  She follows with primary care provider Dr. Sheryle every 4 months for blood work.    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   Family history of coronary artery disease 03/31/2019   Dysphagia 03/05/2019   Unilateral primary osteoarthritis, right hip 08/14/2018   Epigastric discomfort 12/29/2016   Dyslipidemia 01/13/2016   Murmur 01/13/2016   Essential hypertension 01/13/2016   Hiatal hernia    Dyspepsia 03/18/2014   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   COLONOSCOPY  05/18/2009   Dr. Shaaron: Normal rectum/narrow mouth left sided diverticula    COLONOSCOPY N/A 05/14/2013   Dr. Shaaron: hemorrhoids, colonic diverticula, redudant colon   COLONOSCOPY WITH PROPOFOL  N/A 01/18/2023  Procedure: COLONOSCOPY WITH PROPOFOL ;  Surgeon: Shaaron Lamar HERO, MD;  Location: [facility];  Service: Endoscopy;  Laterality: N/A;  7:30 am, asa 2   ESOPHAGOGASTRODUODENOSCOPY  01/21/2003   RMR:  Normal esophagus, stomach, D1 and D2   ESOPHAGOGASTRODUODENOSCOPY N/A 03/24/2014   Dr. Shaaron :Hiatal hernia; otherwise negative EGD   ESOPHAGOGASTRODUODENOSCOPY N/A 06/27/2019   Procedure: ESOPHAGOGASTRODUODENOSCOPY (EGD);  Surgeon: Shaaron Lamar HERO, MD;  Location: [facility];  Service: Endoscopy;  Laterality: N/A;  7:30am   TONSILLECTOMY      TOTAL HIP ARTHROPLASTY Right 05/02/2019   Procedure: RIGHT TOTAL HIP ARTHROPLASTY ANTERIOR APPROACH;  Surgeon: Vernetta Lonni GRADE, MD;  Location: [facility];  Service: Orthopedics;  Laterality: Right;    OB History   No obstetric history on file.      Home Medications    Prior to Admission medications   Medication Sig Start Date End Date Taking? Authorizing Provider  benzonatate  (TESSALON ) 100 MG capsule Take 1 capsule (100 mg total) by mouth 3 (three) times daily as needed for cough. Do not take with alcohol or while operating or driving heavy machinery 88/78/74  Yes Chandra Raisin A, NP  amLODipine (NORVASC) 5 MG tablet Take 5 mg by mouth daily.    [provider]  atorvastatin  (LIPITOR) 20 MG tablet TAKE 1 TABLET(20 MG) BY MOUTH DAILY 05/22/23   Hilty, Vinie BROCKS, MD  Carboxymethylcellulose Sod PF (THERATEARS PF) 0.25 % SOLN 1 dropperette as needed by ophthalmic route.    [provider]  Cholecalciferol  (VITAMIN D3) 50 MCG (2000 UT) capsule Take 2,000 Units by mouth daily.    [provider]  dexlansoprazole  (DEXILANT ) 60 MG capsule Take 1 capsule (60 mg total) by mouth daily. 08/10/23   Rourk, Lamar HERO, MD  Dextromethorphan-guaiFENesin  (MUCINEX  DM MAXIMUM STRENGTH) 60-1200 MG TB12 Take 1 tablet by mouth 2 (two) times daily. 09/09/23   Iola Lukes, FNP  estradiol  (ESTRACE ) 0.1 MG/GM vaginal cream Discard plastic applicator. Insert a blueberry size amount (approximately 1 gram) of cream on fingertip inside vagina at bedtime every night for 1 week then every other night for long term use. 10/12/22   Gerldine Lauraine BROCKS, FNP  ezetimibe  (ZETIA ) 10 MG tablet TAKE 1 TABLET(10 MG) BY MOUTH DAILY 01/01/23   Hilty, Vinie BROCKS, MD  FERROUS SULFATE PO Take 65 mg by mouth daily with breakfast.    [provider]  Glucosamine-Chondroitin (OSTEO BI-FLEX REGULAR STRENGTH PO) Take 1 tablet by mouth daily.     [provider]  levothyroxine  (SYNTHROID ) 50 MCG  tablet Take 50 mcg by mouth daily.    [provider]  Methylcellulose, Laxative, 500 MG TABS Take 1,000 mg by mouth daily.     [provider]  Multiple Vitamins-Minerals (ONE DAILY WOMENS 50 PLUS PO) Take 0.5 tablets by mouth See admin instructions. Mon, Wed Fri and Sat  Centrum Silver 50+    [provider]  polyethylene glycol (MIRALAX  / GLYCOLAX ) packet Take 17 g by mouth daily.    [provider]  promethazine -dextromethorphan (PROMETHAZINE -DM) 6.25-15 MG/5ML syrup Take 5 mLs by mouth every 6 (six) hours as needed for cough. 09/09/23   Iola Lukes, FNP  valsartan (DIOVAN) 320 MG tablet Take 320 mg by mouth at bedtime.  09/08/18   [provider]    Family History Family History  Problem Relation Age of Onset   Heart disease Mother 32   Dementia Mother    Kidney disease Father    Thyroid  nodules Father  Colon cancer Neg Hx     Social History Social History   Tobacco Use   Smoking status: Never   Smokeless tobacco: Never   Tobacco comments:    Never smoked  Vaping Use   Vaping status: Never Used  Substance Use Topics   Alcohol use: No   Drug use: No     Allergies   Crestor  [rosuvastatin ]   Review of Systems Review of Systems Per HPI  Physical Exam Triage Vital Signs ED Triage Vitals [11/23/23 1509]  Encounter Vitals Group     BP 113/66     Girls Systolic BP Percentile      Girls Diastolic BP Percentile      Boys Systolic BP Percentile      Boys Diastolic BP Percentile      Pulse Rate 79     Resp 20     Temp 98.2 F (36.8 C)     Temp Source Oral     SpO2 93 %     Weight      Height      Head Circumference      Peak Flow      Pain Score 6     Pain Loc      Pain Education      Exclude from Growth Chart    No data found.  Updated Vital Signs BP 113/66 (BP Location: Right Arm)   Pulse 79   Temp 98.2 F (36.8 C) (Oral)   Resp 20   SpO2 93%   Visual Acuity Right Eye Distance:   Left Eye  Distance:   Bilateral Distance:    Right Eye Near:   Left Eye Near:    Bilateral Near:     Physical Exam Vitals and nursing note reviewed.  Constitutional:      General: She is not in acute distress.    Appearance: Normal appearance. She is not ill-appearing or toxic-appearing.  HENT:     Head: Normocephalic and atraumatic.     Right Ear: Tympanic membrane, ear canal and external ear normal.     Left Ear: Tympanic membrane, ear canal and external ear normal.     Nose: No congestion or rhinorrhea.     Mouth/Throat:     Mouth: Mucous membranes are moist.     Pharynx: Oropharynx is clear. No oropharyngeal exudate or posterior oropharyngeal erythema.  Eyes:     General: No scleral icterus.    Extraocular Movements: Extraocular movements intact.  Cardiovascular:     Rate and Rhythm: Normal rate and regular rhythm.     Comments: Chest pain not reproducible with palpation Pulmonary:     Effort: Pulmonary effort is normal. No respiratory distress.     Breath sounds: Normal breath sounds. No wheezing, rhonchi or rales.  Musculoskeletal:     Cervical back: Normal range of motion and neck supple.  Lymphadenopathy:     Cervical: No cervical adenopathy.  Skin:    General: Skin is warm and dry.     Coloration: Skin is not jaundiced or pale.     Findings: No erythema or rash.  Neurological:     Mental Status: She is alert and oriented to person, place, and time.  Psychiatric:        Behavior: Behavior is cooperative.      UC Treatments / Results  Labs (all labs ordered are listed, but only abnormal results are displayed) Labs Reviewed - No data to display  EKG   Radiology DG Chest  2 View Result Date: 11/23/2023 CLINICAL DATA:  Cough and congestion for 1 week EXAM: CHEST - 2 VIEW COMPARISON:  04/03/2022 FINDINGS: Frontal and lateral views of the chest demonstrate an unremarkable cardiac silhouette. No acute airspace disease, effusion, or pneumothorax. No acute bony  abnormalities. IMPRESSION: 1. No acute intrathoracic process. Electronically Signed   By: Ozell Daring M.D.   On: 11/23/2023 15:48    Procedures Procedures (including critical care time)  Medications Ordered in UC Medications - No data to display  Initial Impression / Assessment and Plan / UC Course  I have reviewed the triage vital signs and the nursing notes.  Pertinent labs & imaging results that were available during my care of the patient were reviewed by me and considered in my medical decision making (see chart for details).   Vital signs are stable.  Patient is well-appearing.  EKG today shows sinus rhythm with premature atrial contractions; stable EKG when compared with EKG in 05/2023 with cardiology.  No MI.  Chest x-ray is negative for acute cardiopulmonary process.  Unclear cause of chest pain.  Highly doubt acute coronary syndrome given length of chest pain.  I recommended prompt follow-up with cardiologist if symptoms do not improve and gastroenterologist given history of hiatal hernia and feeling like her food is not going down well.  I do believe she has a viral upper respiratory infection which could be exacerbating reflux.  Supportive care was discussed including Mucinex  and cough suppressant medication.  ER and return precautions were discussed as well.  The patient was given the opportunity to ask questions.  All questions answered to their satisfaction.  The patient is in agreement to this plan.   Final Clinical Impressions(s) / UC Diagnoses   Final diagnoses:  Chest pain, unspecified type  Viral URI with cough     Discharge Instructions      You have a viral upper respiratory infection.  Symptoms should improve over the next week to 10 days.  EKG today looks stable compared with previous EKGs.  Chest x-ray today is negative for acute cardiopulmonary process.  I suspect a viral upper respiratory infection is flaring up your reflux.  Continue the Dexilant  as  prescribed.  Avoid food triggers such as caffeine and chocolate.  Follow-up with your gastroenterologist promptly.  Some things that can make you feel better are: - Increased rest - Increasing fluid with water /sugar free electrolytes - Acetaminophen  and ibuprofen as needed for fever/pain - Salt water  gargling, chloraseptic spray and throat lozenges - OTC guaifenesin  (Mucinex ) 600 mg twice daily for congestion - Saline sinus flushes or a neti pot - Humidifying the air -Tessalon  Perles every 8 hours as needed for dry cough      ED Prescriptions     Medication Sig Dispense Auth. Provider   benzonatate  (TESSALON ) 100 MG capsule Take 1 capsule (100 mg total) by mouth 3 (three) times daily as needed for cough. Do not take with alcohol or while operating or driving heavy machinery 21 capsule Chandra Harlene LABOR, NP      PDMP not reviewed this encounter.   Chandra Harlene LABOR, NP 11/23/23 [phone]

## 2023-11-23 NOTE — Discharge Instructions (Signed)
 You have a viral upper respiratory infection.  Symptoms should improve over the next week to 10 days.  EKG today looks stable compared with previous EKGs.  Chest x-ray today is negative for acute cardiopulmonary process.  I suspect a viral upper respiratory infection is flaring up your reflux.  Continue the Dexilant  as prescribed.  Avoid food triggers such as caffeine and chocolate.  Follow-up with your gastroenterologist promptly.  Some things that can make you feel better are: - Increased rest - Increasing fluid with water /sugar free electrolytes - Acetaminophen  and ibuprofen as needed for fever/pain - Salt water  gargling, chloraseptic spray and throat lozenges - OTC guaifenesin  (Mucinex ) 600 mg twice daily for congestion - Saline sinus flushes or a neti pot - Humidifying the air -Tessalon  Perles every 8 hours as needed for dry cough

## 2023-12-10 ENCOUNTER — Other Ambulatory Visit: Payer: Self-pay | Admitting: Internal Medicine
